# Patient Record
Sex: Female | Born: 1950 | Race: Black or African American | Hispanic: No | Smoking: Former smoker
Health system: Southern US, Community
[De-identification: ages and names within clinical notes are randomized; demographics above are authoritative.]

## PROBLEM LIST (undated history)

## (undated) DIAGNOSIS — I2510 Atherosclerotic heart disease of native coronary artery without angina pectoris: Secondary | ICD-10-CM

## (undated) DIAGNOSIS — E785 Hyperlipidemia, unspecified: Secondary | ICD-10-CM

## (undated) DIAGNOSIS — E119 Type 2 diabetes mellitus without complications: Secondary | ICD-10-CM

## (undated) DIAGNOSIS — D649 Anemia, unspecified: Secondary | ICD-10-CM

## (undated) DIAGNOSIS — K219 Gastro-esophageal reflux disease without esophagitis: Secondary | ICD-10-CM

## (undated) DIAGNOSIS — I10 Essential (primary) hypertension: Secondary | ICD-10-CM

## (undated) DIAGNOSIS — R7303 Prediabetes: Secondary | ICD-10-CM

## (undated) DIAGNOSIS — I219 Acute myocardial infarction, unspecified: Secondary | ICD-10-CM

## (undated) DIAGNOSIS — M1990 Unspecified osteoarthritis, unspecified site: Secondary | ICD-10-CM

## (undated) HISTORY — DX: Atherosclerotic heart disease of native coronary artery without angina pectoris: I25.10

## (undated) HISTORY — PX: ABDOMINAL HYSTERECTOMY: SHX81

## (undated) HISTORY — DX: Hyperlipidemia, unspecified: E78.5

---

## 2012-07-18 HISTORY — PX: BREAST BIOPSY: SHX20

## 2012-07-26 ENCOUNTER — Other Ambulatory Visit: Payer: Self-pay | Admitting: *Deleted

## 2012-07-26 DIAGNOSIS — R921 Mammographic calcification found on diagnostic imaging of breast: Secondary | ICD-10-CM

## 2012-08-02 ENCOUNTER — Ambulatory Visit
Admission: RE | Admit: 2012-08-02 | Discharge: 2012-08-02 | Disposition: A | Discharge status: Home / Self Care | Payer: No Typology Code available for payment source | Source: Ambulatory Visit | Attending: *Deleted | Admitting: *Deleted

## 2012-08-02 DIAGNOSIS — R921 Mammographic calcification found on diagnostic imaging of breast: Secondary | ICD-10-CM

## 2013-06-25 ENCOUNTER — Other Ambulatory Visit (HOSPITAL_COMMUNITY): Payer: Self-pay | Admitting: Nurse Practitioner

## 2013-06-25 ENCOUNTER — Other Ambulatory Visit (HOSPITAL_COMMUNITY): Payer: Self-pay | Admitting: Family Medicine

## 2013-06-25 DIAGNOSIS — Z139 Encounter for screening, unspecified: Secondary | ICD-10-CM

## 2013-07-19 ENCOUNTER — Ambulatory Visit (HOSPITAL_COMMUNITY)
Admission: RE | Admit: 2013-07-19 | Discharge: 2013-07-19 | Disposition: A | Discharge status: Home / Self Care | Payer: BC Managed Care – PPO | Source: Ambulatory Visit | Attending: Family Medicine | Admitting: Family Medicine

## 2013-07-19 DIAGNOSIS — Z139 Encounter for screening, unspecified: Secondary | ICD-10-CM

## 2013-07-19 DIAGNOSIS — Z1231 Encounter for screening mammogram for malignant neoplasm of breast: Secondary | ICD-10-CM | POA: Insufficient documentation

## 2014-07-24 ENCOUNTER — Other Ambulatory Visit (HOSPITAL_COMMUNITY): Payer: Self-pay | Admitting: Family Medicine

## 2014-07-24 DIAGNOSIS — Z139 Encounter for screening, unspecified: Secondary | ICD-10-CM

## 2014-08-01 ENCOUNTER — Ambulatory Visit (HOSPITAL_COMMUNITY)
Admission: RE | Admit: 2014-08-01 | Discharge: 2014-08-01 | Disposition: A | Discharge status: Home / Self Care | Payer: BC Managed Care – PPO | Source: Ambulatory Visit | Attending: Family Medicine | Admitting: Family Medicine

## 2014-08-01 DIAGNOSIS — Z1231 Encounter for screening mammogram for malignant neoplasm of breast: Secondary | ICD-10-CM | POA: Diagnosis not present

## 2014-08-01 DIAGNOSIS — Z139 Encounter for screening, unspecified: Secondary | ICD-10-CM

## 2014-10-18 HISTORY — PX: OTHER SURGICAL HISTORY: SHX169

## 2015-04-26 ENCOUNTER — Encounter (HOSPITAL_COMMUNITY)
Admission: EM | Disposition: A | Discharge status: Home / Self Care | Payer: BC Managed Care – PPO | Source: Home / Self Care | Attending: Cardiovascular Disease

## 2015-04-26 ENCOUNTER — Inpatient Hospital Stay: Admit: 2015-04-26 | Payer: Self-pay | Admitting: Cardiovascular Disease

## 2015-04-26 ENCOUNTER — Encounter (HOSPITAL_COMMUNITY)
Admission: EM | Disposition: A | Discharge status: Home / Self Care | Payer: Self-pay | Source: Home / Self Care | Attending: Cardiovascular Disease

## 2015-04-26 ENCOUNTER — Encounter (HOSPITAL_COMMUNITY): Payer: Self-pay | Admitting: *Deleted

## 2015-04-26 ENCOUNTER — Inpatient Hospital Stay (HOSPITAL_COMMUNITY): Payer: BC Managed Care – PPO

## 2015-04-26 ENCOUNTER — Inpatient Hospital Stay (HOSPITAL_COMMUNITY)
Admission: EM | Admit: 2015-04-26 | Discharge: 2015-04-28 | DRG: 247 | Disposition: A | Discharge status: Home / Self Care | Payer: BC Managed Care – PPO | Attending: Cardiovascular Disease | Admitting: Cardiovascular Disease

## 2015-04-26 DIAGNOSIS — I2111 ST elevation (STEMI) myocardial infarction involving right coronary artery: Secondary | ICD-10-CM | POA: Diagnosis not present

## 2015-04-26 DIAGNOSIS — F17210 Nicotine dependence, cigarettes, uncomplicated: Secondary | ICD-10-CM | POA: Diagnosis present

## 2015-04-26 DIAGNOSIS — I2119 ST elevation (STEMI) myocardial infarction involving other coronary artery of inferior wall: Secondary | ICD-10-CM | POA: Diagnosis present

## 2015-04-26 DIAGNOSIS — I10 Essential (primary) hypertension: Secondary | ICD-10-CM | POA: Diagnosis present

## 2015-04-26 DIAGNOSIS — Z720 Tobacco use: Secondary | ICD-10-CM | POA: Diagnosis present

## 2015-04-26 DIAGNOSIS — Z955 Presence of coronary angioplasty implant and graft: Secondary | ICD-10-CM

## 2015-04-26 DIAGNOSIS — E785 Hyperlipidemia, unspecified: Secondary | ICD-10-CM | POA: Diagnosis present

## 2015-04-26 DIAGNOSIS — E119 Type 2 diabetes mellitus without complications: Secondary | ICD-10-CM | POA: Diagnosis present

## 2015-04-26 DIAGNOSIS — I221 Subsequent ST elevation (STEMI) myocardial infarction of inferior wall: Secondary | ICD-10-CM

## 2015-04-26 DIAGNOSIS — I2510 Atherosclerotic heart disease of native coronary artery without angina pectoris: Secondary | ICD-10-CM | POA: Diagnosis present

## 2015-04-26 DIAGNOSIS — I213 ST elevation (STEMI) myocardial infarction of unspecified site: Secondary | ICD-10-CM | POA: Insufficient documentation

## 2015-04-26 HISTORY — DX: Essential (primary) hypertension: I10

## 2015-04-26 HISTORY — PX: CARDIAC CATHETERIZATION: SHX172

## 2015-04-26 HISTORY — DX: Prediabetes: R73.03

## 2015-04-26 LAB — CBC
HEMATOCRIT: 37.9 % (ref 36.0–46.0)
Hemoglobin: 13 g/dL (ref 12.0–15.0)
MCH: 31.5 pg (ref 26.0–34.0)
MCHC: 34.3 g/dL (ref 30.0–36.0)
MCV: 91.8 fL (ref 78.0–100.0)
Platelets: 251 10*3/uL (ref 150–400)
RBC: 4.13 MIL/uL (ref 3.87–5.11)
RDW: 12 % (ref 11.5–15.5)
WBC: 10.2 10*3/uL (ref 4.0–10.5)

## 2015-04-26 LAB — TROPONIN I
TROPONIN I: 3.82 ng/mL — AB (ref <0.031)
TROPONIN I: 7.93 ng/mL — AB (ref <0.031)
TROPONIN I: 5.52 ng/mL — AB (ref <0.031)

## 2015-04-26 LAB — BASIC METABOLIC PANEL
ANION GAP: 9 (ref 5–15)
BUN: <5 mg/dL — ABNORMAL LOW (ref 6–20)
CO2: 26 mmol/L (ref 22–32)
CREATININE: 0.65 mg/dL (ref 0.44–1.00)
Calcium: 9.4 mg/dL (ref 8.9–10.3)
Chloride: 103 mmol/L (ref 101–111)
Glucose, Bld: 206 mg/dL — ABNORMAL HIGH (ref 65–99)
Potassium: 3.7 mmol/L (ref 3.5–5.1)
Sodium: 138 mmol/L (ref 135–145)

## 2015-04-26 LAB — GLUCOSE, CAPILLARY
GLUCOSE-CAPILLARY: 199 mg/dL — AB (ref 65–99)
GLUCOSE-CAPILLARY: 161 mg/dL — AB (ref 65–99)
Glucose-Capillary: 168 mg/dL — ABNORMAL HIGH (ref 65–99)
Glucose-Capillary: 185 mg/dL — ABNORMAL HIGH (ref 65–99)

## 2015-04-26 LAB — COMPREHENSIVE METABOLIC PANEL
ALBUMIN: 4.2 g/dL (ref 3.5–5.0)
ALK PHOS: 111 U/L (ref 38–126)
ALT: 20 U/L (ref 14–54)
ANION GAP: 10 (ref 5–15)
AST: 41 U/L (ref 15–41)
BILIRUBIN TOTAL: 0.4 mg/dL (ref 0.3–1.2)
BUN: 5 mg/dL — AB (ref 6–20)
CHLORIDE: 103 mmol/L (ref 101–111)
CO2: 26 mmol/L (ref 22–32)
Calcium: 9.5 mg/dL (ref 8.9–10.3)
Creatinine, Ser: 0.76 mg/dL (ref 0.44–1.00)
GFR calc Af Amer: >60 mL/min (ref >60)
Glucose, Bld: 237 mg/dL — ABNORMAL HIGH (ref 65–99)
POTASSIUM: 3.9 mmol/L (ref 3.5–5.1)
Sodium: 139 mmol/L (ref 135–145)
Total Protein: 7.7 g/dL (ref 6.5–8.1)

## 2015-04-26 LAB — LIPID PANEL
CHOL/HDL RATIO: 3.4 ratio
Cholesterol: 180 mg/dL (ref 0–200)
HDL: 53 mg/dL (ref >40)
LDL Cholesterol: 112 mg/dL — ABNORMAL HIGH (ref 0–99)
TRIGLYCERIDES: 76 mg/dL (ref <150)
VLDL: 15 mg/dL (ref 0–40)

## 2015-04-26 LAB — MAGNESIUM: Magnesium: 1.6 mg/dL — ABNORMAL LOW (ref 1.7–2.4)

## 2015-04-26 LAB — TSH: TSH: 0.618 u[IU]/mL (ref 0.350–4.500)

## 2015-04-26 LAB — MRSA PCR SCREENING: MRSA BY PCR: NEGATIVE

## 2015-04-26 SURGERY — LEFT HEART CATH AND CORONARY ANGIOGRAPHY
Anesthesia: LOCAL

## 2015-04-26 MED ORDER — RADIAL COCKTAIL (HEPARIN/VERAPAMIL/LIDOCAINE/NITRO)
Status: DC | PRN
  Administered 2015-04-26: 1 via INTRA_ARTERIAL

## 2015-04-26 MED ORDER — ASPIRIN 300 MG RE SUPP: 300.0000 mg | RECTAL | Status: DC

## 2015-04-26 MED ORDER — ATORVASTATIN CALCIUM 80 MG PO TABS
80.0000 mg | ORAL_TABLET | Freq: Every day | ORAL | Status: DC
  Administered 2015-04-26 – 2015-04-27 (×2): 80 mg via ORAL
  Filled 2015-04-26 (×3): qty 1

## 2015-04-26 MED ORDER — HEPARIN SODIUM (PORCINE) 1000 UNIT/ML IJ SOLN
INTRAMUSCULAR | Status: DC | PRN
  Administered 2015-04-26: 2500 [IU] via INTRAVENOUS

## 2015-04-26 MED ORDER — IOHEXOL 300 MG/ML  SOLN
INTRAMUSCULAR | Status: DC | PRN
  Administered 2015-04-26: 175 mL via INTRA_ARTERIAL

## 2015-04-26 MED ORDER — TICAGRELOR 90 MG PO TABS
ORAL_TABLET | ORAL | Status: DC | PRN
  Administered 2015-04-26: 180 mg via ORAL

## 2015-04-26 MED ORDER — BIVALIRUDIN BOLUS VIA INFUSION - CUPID
INTRAVENOUS | Status: DC | PRN
  Administered 2015-04-26: 43.5 mg via INTRAVENOUS

## 2015-04-26 MED ORDER — ONDANSETRON HCL 4 MG/2ML IJ SOLN: 4.0000 mg | Freq: Four times a day (QID) | INTRAMUSCULAR | Status: DC | PRN

## 2015-04-26 MED ORDER — ASPIRIN EC 81 MG PO TBEC: 81.0000 mg | DELAYED_RELEASE_TABLET | Freq: Every day | ORAL | Status: DC

## 2015-04-26 MED ORDER — SODIUM CHLORIDE 0.9 % IV SOLN
1.0000 mg/kg/h | INTRAVENOUS | Status: AC
  Administered 2015-04-26: 1 mg/kg/h via INTRAVENOUS
  Filled 2015-04-26: qty 250

## 2015-04-26 MED ORDER — ACETAMINOPHEN 325 MG PO TABS: 650.0000 mg | ORAL_TABLET | ORAL | Status: DC | PRN

## 2015-04-26 MED ORDER — MORPHINE SULFATE 2 MG/ML IJ SOLN: 2.0000 mg | INTRAMUSCULAR | Status: DC | PRN

## 2015-04-26 MED ORDER — ASPIRIN 81 MG PO CHEW
81.0000 mg | CHEWABLE_TABLET | Freq: Every day | ORAL | Status: DC
  Administered 2015-04-26 – 2015-04-28 (×3): 81 mg via ORAL
  Filled 2015-04-26 (×3): qty 1

## 2015-04-26 MED ORDER — METOPROLOL TARTRATE 12.5 MG HALF TABLET
12.5000 mg | ORAL_TABLET | Freq: Two times a day (BID) | ORAL | Status: DC
  Administered 2015-04-26 – 2015-04-28 (×5): 12.5 mg via ORAL
  Filled 2015-04-26 (×6): qty 1

## 2015-04-26 MED ORDER — NITROGLYCERIN 0.4 MG SL SUBL: 0.4000 mg | SUBLINGUAL_TABLET | SUBLINGUAL | Status: DC | PRN

## 2015-04-26 MED ORDER — FENTANYL CITRATE (PF) 100 MCG/2ML IJ SOLN
INTRAMUSCULAR | Status: DC | PRN
  Administered 2015-04-26: 25 ug via INTRAVENOUS

## 2015-04-26 MED ORDER — ONDANSETRON HCL 4 MG/2ML IJ SOLN
4.0000 mg | Freq: Four times a day (QID) | INTRAMUSCULAR | Status: DC | PRN
Start: 2015-04-26 — End: 2015-04-28
  Administered 2015-04-26: 4 mg via INTRAVENOUS
  Filled 2015-04-26: qty 2

## 2015-04-26 MED ORDER — SODIUM CHLORIDE 0.9 % IV SOLN
250.0000 mg | INTRAVENOUS | Status: DC | PRN
  Administered 2015-04-26: 1.75 mg/kg/h via INTRAVENOUS

## 2015-04-26 MED ORDER — LISINOPRIL 10 MG PO TABS
10.0000 mg | ORAL_TABLET | Freq: Every day | ORAL | Status: DC
  Administered 2015-04-26 – 2015-04-28 (×3): 10 mg via ORAL
  Filled 2015-04-26 (×3): qty 1

## 2015-04-26 MED ORDER — SODIUM CHLORIDE 0.9 % IJ SOLN: 3.0000 mL | INTRAMUSCULAR | Status: DC | PRN

## 2015-04-26 MED ORDER — SODIUM CHLORIDE 0.9 % IV SOLN: 250.0000 mL | INTRAVENOUS | Status: DC | PRN

## 2015-04-26 MED ORDER — ASPIRIN 81 MG PO CHEW: 324.0000 mg | CHEWABLE_TABLET | ORAL | Status: DC

## 2015-04-26 MED ORDER — SODIUM CHLORIDE 0.9 % IJ SOLN
3.0000 mL | Freq: Two times a day (BID) | INTRAMUSCULAR | Status: DC
  Administered 2015-04-26 – 2015-04-27 (×4): 3 mL via INTRAVENOUS

## 2015-04-26 MED ORDER — TICAGRELOR 90 MG PO TABS
90.0000 mg | ORAL_TABLET | Freq: Two times a day (BID) | ORAL | Status: DC
  Administered 2015-04-26 – 2015-04-28 (×5): 90 mg via ORAL
  Filled 2015-04-26 (×6): qty 1

## 2015-04-26 MED ORDER — MIDAZOLAM HCL 2 MG/2ML IJ SOLN
INTRAMUSCULAR | Status: DC | PRN
  Administered 2015-04-26: 0.5 mg via INTRAVENOUS

## 2015-04-26 MED ORDER — TICAGRELOR 90 MG PO TABS: 90.0000 mg | ORAL_TABLET | Freq: Two times a day (BID) | ORAL | Status: DC

## 2015-04-26 MED ORDER — ATORVASTATIN CALCIUM 80 MG PO TABS
80.0000 mg | ORAL_TABLET | Freq: Every day | ORAL | Status: DC
  Filled 2015-04-26: qty 1

## 2015-04-26 MED ORDER — SODIUM CHLORIDE 0.9 % WEIGHT BASED INFUSION
3.0000 mL/kg/h | INTRAVENOUS | Status: AC
  Administered 2015-04-26: 3 mL/kg/h via INTRAVENOUS

## 2015-04-26 SURGICAL SUPPLY — 23 items
BALLN EMERGE MR 2.0X12 (BALLOONS) ×2
BALLN ~~LOC~~ EUPHORA RX 2.5X15 (BALLOONS) ×2
BALLOON EMERGE MR 2.0X12 (BALLOONS) ×1 IMPLANT
BALLOON ~~LOC~~ EUPHORA RX 2.5X15 (BALLOONS) ×1 IMPLANT
CATH INFINITI 5FR ANG PIGTAIL (CATHETERS) ×2 IMPLANT
CATH INFINITI 5FR MULTPACK ANG (CATHETERS) IMPLANT
CATH OPTITORQUE TIG 4.0 5F (CATHETERS) ×2 IMPLANT
DEVICE RAD COMP TR BAND LRG (VASCULAR PRODUCTS) ×2 IMPLANT
GLIDESHEATH SLEND A-KIT 6F 22G (SHEATH) ×2 IMPLANT
GUIDE CATH RUNWAY 6FR FR4 (CATHETERS) ×2 IMPLANT
KIT ENCORE 26 ADVANTAGE (KITS) ×2 IMPLANT
KIT HEART LEFT (KITS) ×2 IMPLANT
PACK CARDIAC CATHETERIZATION (CUSTOM PROCEDURE TRAY) ×2 IMPLANT
SHEATH PINNACLE 5F 10CM (SHEATH) IMPLANT
STENT XIENCE ALPINE RX 2.25X18 (Permanent Stent) ×2 IMPLANT
STENT XIENCE ALPINE RX 2.25X23 (Permanent Stent) ×2 IMPLANT
SYR MEDRAD MARK V 150ML (SYRINGE) ×2 IMPLANT
TRANSDUCER W/STOPCOCK (MISCELLANEOUS) ×2 IMPLANT
TUBING CIL FLEX 10 FLL-RA (TUBING) ×2 IMPLANT
WIRE ASAHI PROWATER 180CM (WIRE) ×2 IMPLANT
WIRE EMERALD 3MM-J .035X150CM (WIRE) IMPLANT
WIRE HI TORQ VERSACORE-J 145CM (WIRE) ×2 IMPLANT
WIRE SAFE-T 1.5MM-J .035X260CM (WIRE) ×2 IMPLANT

## 2015-04-26 NOTE — Progress Notes (Signed)
eLink Physician-Brief Progress Note Patient Name: Andrea SplinterSherry Hairston Herrera DOB: 07-27-1951 MRN: [id number]   Date of Service  04/26/2015  HPI/Events of Note  [age] F with no major PMH presenting with inferior MI.  To cath lab where she received DES x2 to RCA.  Patient is HD stable with sats of 97%.   eICU Interventions  Plan of care per primary cardiology team Continue [facility]     Intervention Category Evaluation Type: New Patient Evaluation  DETERDING,ELIZABETH 04/26/2015, 3:37 AM

## 2015-04-26 NOTE — Progress Notes (Signed)
Echocardiogram 2D Echocardiogram has been performed.  Dorothey BasemanReel, Shebra Muldrow M 04/26/2015, 11:36 AM

## 2015-04-26 NOTE — Progress Notes (Signed)
CRITICAL VALUE ALERT  Critical value received:  Troponin 5.52  Date of notification:  04/26/15  Time of notification:  1717  Critical value read back:Yes.    Nurse who received alert:  Toula MoosABERION, Draysen Weygandt J  MD notified (1st page):  N/a expected value, patient came in as a STEMI with 2 D.E.S stents placed this AM. Troponins trending down. Pt has no complaints of C/P this afternoon.

## 2015-04-26 NOTE — Progress Notes (Signed)
CARDIAC REHAB PHASE I   Pt tired after trip to [place]. Will hold ambulation today. Ed completed with pt. Good comprehension and questions. Interested in cRPII and will [name] (although the drive might be too far for pt). Encouraged pt to walk tomorrow. Pt plans to quit smoking. Discussed importance of Brilinta and gave booklet.  [id number]  Elissa LovettReeve, Persephonie Hegwood BethesdaKristan CES, ACSM 04/26/2015 3:54 PM

## 2015-04-26 NOTE — Progress Notes (Signed)
The patient hadacute inferior ST segment elevation myocardial infarction with a high-grade 99% distal dominant RCA stenosis with TIMI 2 flow. She had successful placement of 2 overlapping drug-eluting stents in the mid to distal and proximal to mid RCA. She had minimal disease in her left system and had normal LV function. The patient is seen on morning rounds.  She is resting comfortably.  No chest pain.  Rhythm stable normal sinus rhythm.  Echocardiogram pending.

## 2015-04-26 NOTE — H&P (Signed)
     Chief Complaint:  STEMI  HPI:  This is a 64 y.o. female with a past medical history significant for non-insulin-dependent diabetes and treated hypertension. She has no prior cardiac history. She does smoke. She developed chest pain 2 days ago which was approximately waning until this evening when she had more prolonged chest pain requiring [facility] where she was noted to have inferior ST segment elevation. [facility]   PMHx:  No past medical history on file.  No past surgical history on file.  FAMHx:  No family history on file.  SOCHx:   has no tobacco, alcohol, and drug history on file.  ALLERGIES:  Allergies not on file  ROS: Pertinent items are noted in HPI.  HOME MEDS: No prescriptions prior to admission    LABS/IMAGING: No results found for this or any previous visit (from the past 48 hour(s)). No results found.  VITALS: Blood pressure 134/77, pulse 82, resp. rate 14, SpO2 100 %.  EXAM: General appearance: alert and no distress Neck: no adenopathy, no carotid bruit, no JVD, supple, symmetrical, trachea midline and thyroid not enlarged, symmetric, no tenderness/mass/nodules Lungs: clear to auscultation bilaterally Heart: regular rate and rhythm, S1, S2 normal, no murmur, click, rub or gallop Extremities: extremities normal, atraumatic, no cyanosis or edema  IMPRESSION: Inferior STEMI  PLAN: Urgent PCI intervention performed radially  Andrea BattyBerry, Jonathan 04/26/2015, 3:02 AM

## 2015-04-27 ENCOUNTER — Inpatient Hospital Stay (HOSPITAL_COMMUNITY): Payer: BC Managed Care – PPO

## 2015-04-27 LAB — GLUCOSE, CAPILLARY
GLUCOSE-CAPILLARY: 239 mg/dL — AB (ref 65–99)
Glucose-Capillary: 161 mg/dL — ABNORMAL HIGH (ref 65–99)
Glucose-Capillary: 210 mg/dL — ABNORMAL HIGH (ref 65–99)
Glucose-Capillary: 169 mg/dL — ABNORMAL HIGH (ref 65–99)

## 2015-04-27 MED ORDER — INSULIN ASPART 100 UNIT/ML ~~LOC~~ SOLN
0.0000 [IU] | Freq: Three times a day (TID) | SUBCUTANEOUS | Status: DC
  Administered 2015-04-27: 5 [IU] via SUBCUTANEOUS
  Administered 2015-04-27: 3 [IU] via SUBCUTANEOUS
  Administered 2015-04-28: 5 [IU] via SUBCUTANEOUS

## 2015-04-27 NOTE — Progress Notes (Signed)
Patient Name: Andrea SplinterSherry Hairston Finnigan Date of Encounter: 04/27/2015     Principal Problem:   Subsequent st elevation (stemi) myocardial infarction of inferior wall Active Problems:   STEMI (ST elevation myocardial infarction)   Acute ST elevation myocardial infarction (STEMI) involving other coronary artery of inferior wall    SUBJECTIVE  Patient denies any chest pain or shortness of breath today.  Her appetite has improved since yesterday.  Her blood pressure has improved.  Telemetry shows normal sinus rhythm.  Blood sugars running high off metformin  CURRENT MEDS . aspirin  81 mg Oral Daily  . atorvastatin  80 mg Oral q1800  . lisinopril  10 mg Oral Daily  . metoprolol tartrate  12.5 mg Oral BID  . sodium chloride  3 mL Intravenous Q12H  . ticagrelor  90 mg Oral BID    OBJECTIVE  Filed Vitals:   04/27/15 0500 04/27/15 0600 04/27/15 0700 04/27/15 0723  BP: 121/45 127/54 135/51   Pulse:      Temp:    98 F (36.7 C)  TempSrc:    Oral  Resp:  17 20 16   Height:      Weight: 125 lb 10.6 oz (57 kg)     SpO2:    99%    Intake/Output Summary (Last 24 hours) at 04/27/15 0805 Last data filed at 04/26/15 1700  Gross per 24 hour  Intake    726 ml  Output    600 ml  Net    126 ml   Filed Weights   04/26/15 0310 04/27/15 0500  Weight: 124 lb 5.4 oz (56.4 kg) 125 lb 10.6 oz (57 kg)    PHYSICAL EXAM  General: Pleasant, NAD. Neuro: Alert and oriented X 3. Moves all extremities spontaneously. Psych: Normal affect. HEENT:  Normal  Neck: Supple without bruits or JVD. Lungs:  Resp regular and unlabored, CTA. Heart: RRR no s3, s4, or murmurs. Abdomen: Soft, non-tender, non-distended, BS + x 4.  Extremities: No clubbing, cyanosis or edema. DP/PT/Radials 2+ and equal bilaterally.  Accessory Clinical Findings  CBC  Recent Labs  04/26/15 0846  WBC 10.2  HGB 13.0  HCT 37.9  MCV 91.8  PLT 251   Basic Metabolic Panel  Recent Labs  04/26/15 0357 04/26/15 0846    NA 139 138  K 3.9 3.7  CL 103 103  CO2 26 26  GLUCOSE 237* 206*  BUN 5* <5*  CREATININE 0.76 0.65  CALCIUM 9.5 9.4  MG 1.6*  --    Liver Function Tests  Recent Labs  04/26/15 0357  AST 41  ALT 20  ALKPHOS 111  BILITOT 0.4  PROT 7.7  ALBUMIN 4.2   No results for input(s): LIPASE, AMYLASE in the last 72 hours. Cardiac Enzymes  Recent Labs  04/26/15 0357 04/26/15 0846 04/26/15 1350  TROPONINI 3.82* 7.93* 5.52*   BNP Invalid input(s): POCBNP D-Dimer No results for input(s): DDIMER in the last 72 hours. Hemoglobin A1C No results for input(s): HGBA1C in the last 72 hours. Fasting Lipid Panel  Recent Labs  04/26/15 0357  CHOL 180  HDL 53  LDLCALC 112*  TRIG 76  CHOLHDL 3.4   Thyroid Function Tests  Recent Labs  04/26/15 0357  TSH 0.618    TELE  Normal sinus rhythm  2-D echo: Ejection fraction 55-60%.  No regional wall motion abnormalities.   Radiology/Studies  No results found.  ASSESSMENT AND PLAN 1.  Acute inferior wall STEMI secondary to high-grade 99% distal dominant right  CVA stenosis.  Troponin peak 7.93. 2.  Diabetes mellitus 3.  Tobacco abuse 4.  Hyperlipidemia 5.  Hypertension  Plan: Advance activity.  Cardiac rehabilitation.  Walk in hall today. Obtain chest x-ray PA and lateral today. Sliding scale insulin.  Plan to resume metformin tomorrow. Anticipate probably home tomorrow.  Signed, Cassell Clement MD

## 2015-04-28 ENCOUNTER — Encounter (HOSPITAL_COMMUNITY): Payer: Self-pay | Admitting: Cardiovascular Disease

## 2015-04-28 ENCOUNTER — Other Ambulatory Visit (HOSPITAL_COMMUNITY): Payer: BC Managed Care – PPO

## 2015-04-28 DIAGNOSIS — Z720 Tobacco use: Secondary | ICD-10-CM | POA: Diagnosis present

## 2015-04-28 DIAGNOSIS — E785 Hyperlipidemia, unspecified: Secondary | ICD-10-CM | POA: Diagnosis present

## 2015-04-28 DIAGNOSIS — E119 Type 2 diabetes mellitus without complications: Secondary | ICD-10-CM

## 2015-04-28 LAB — GLUCOSE, CAPILLARY: GLUCOSE-CAPILLARY: 201 mg/dL — AB (ref 65–99)

## 2015-04-28 LAB — HEMOGLOBIN A1C
Hgb A1c MFr Bld: 6.7 % — ABNORMAL HIGH (ref 4.8–5.6)
MEAN PLASMA GLUCOSE: 146 mg/dL

## 2015-04-28 LAB — POCT ACTIVATED CLOTTING TIME: Activated Clotting Time: 552 seconds

## 2015-04-28 MED ORDER — ASPIRIN EC 81 MG PO TBEC
81.0000 mg | DELAYED_RELEASE_TABLET | Freq: Every day | ORAL | Status: DC
Start: 2015-04-28 — End: 2023-08-17

## 2015-04-28 MED ORDER — METOPROLOL TARTRATE 25 MG PO TABS: 12.5000 mg | ORAL_TABLET | Freq: Two times a day (BID) | ORAL | Status: DC

## 2015-04-28 MED ORDER — LISINOPRIL 10 MG PO TABS: 10.0000 mg | ORAL_TABLET | Freq: Every day | ORAL | Status: DC

## 2015-04-28 MED ORDER — TICAGRELOR 90 MG PO TABS
90.0000 mg | ORAL_TABLET | Freq: Two times a day (BID) | ORAL | Status: DC
Start: 2015-04-28 — End: 2015-09-01

## 2015-04-28 MED ORDER — NITROGLYCERIN 0.4 MG SL SUBL: 0.4000 mg | SUBLINGUAL_TABLET | SUBLINGUAL | Status: DC | PRN

## 2015-04-28 MED ORDER — ATORVASTATIN CALCIUM 80 MG PO TABS: 80.0000 mg | ORAL_TABLET | Freq: Every day | ORAL | Status: DC

## 2015-04-28 MED FILL — Lidocaine HCl Local Preservative Free (PF) Inj 1%: INTRAMUSCULAR | Qty: 30 | Status: AC

## 2015-04-28 MED FILL — Heparin Sodium (Porcine) 2 Unit/ML in Sodium Chloride 0.9%: INTRAMUSCULAR | Qty: 1000 | Status: AC

## 2015-04-28 NOTE — Care Management Note (Signed)
Case Management Note  Patient Details  Name: Andrea Herrera MRN: [id number] Date of Birth: 1951-01-19  Subjective/Objective:       Adm w mi           Action/Plan: lives w fam, pcp dr Selinda Flavinkevin howard   Expected Discharge Date:                  Expected Discharge Plan:     In-House Referral:     Discharge planning Services     Post Acute Care Choice:    Choice offered to:     DME Arranged:    DME Agency:     HH Arranged:    HH Agency:     Status of Service:     Medicare Important Message Given:    Date Medicare IM Given:    Medicare IM give by:    Date Additional Medicare IM Given:    Additional Medicare Important Message give by:     If discussed at Long Length of Stay Meetings, dates discussed:    Additional Comments: pt given brilinta 30day free and copay assist card. Chart states bcbs ins.  Hanley Haysowell, Lyndy Russman T, RN 04/28/2015, 9:59 AM

## 2015-04-28 NOTE — Discharge Summary (Signed)
Name: Andrea SplinterSherry Hairston Herrera MRN: [id number] DOB: [birth year] 64 y.o. PCP: Selinda FlavinKevin Howard, MD ______________________________________________________________  Date of Admission: 04/26/2015  2:05 AM Date of Discharge: 04/28/2015 Attending Physician: Runell GessJonathan J Berry, MD   Discharge Diagnosis: Patient Active Problem List   Diagnosis Date Noted  . Diabetes mellitus 04/28/2015  . Dyslipidemia 04/28/2015  . Tobacco abuse 04/28/2015  . Subsequent st elevation (stemi) myocardial infarction of inferior wall 04/26/2015  . STEMI (ST elevation myocardial infarction) 04/26/2015  . Acute ST elevation myocardial infarction (STEMI) involving other coronary artery of inferior wall 04/26/2015    Discharge Medications:   Medication List    STOP taking these medications        amLODipine 10 MG tablet  Commonly known as:  NORVASC     quinapril 20 MG tablet  Commonly known as:  ACCUPRIL      TAKE these medications        aspirin EC 81 MG tablet  Take 1 tablet (81 mg total) by mouth daily.     atorvastatin 80 MG tablet  Commonly known as:  LIPITOR  Take 1 tablet (80 mg total) by mouth daily at 6 PM.     cholecalciferol 1000 UNITS tablet  Commonly known as:  VITAMIN D  Take 1,000 Units by mouth daily.     lisinopril 10 MG tablet  Commonly known as:  PRINIVIL,ZESTRIL  Take 1 tablet (10 mg total) by mouth daily.     metFORMIN 500 MG 24 hr tablet  Commonly known as:  GLUCOPHAGE-XR  Take 500 mg by mouth daily with breakfast.     metoprolol tartrate 25 MG tablet  Commonly known as:  LOPRESSOR  Take 0.5 tablets (12.5 mg total) by mouth 2 (two) times daily.     multivitamin tablet  Take 1 tablet by mouth daily.     nitroGLYCERIN 0.4 MG SL tablet  Commonly known as:  NITROSTAT  Place 1 tablet (0.4 mg total) under the tongue every 5 (five) minutes x 3 doses as needed for chest pain.     ticagrelor 90 MG Tabs tablet  Commonly known as:  BRILINTA  Take 1 tablet (90 mg total) by  mouth 2 (two) times daily.     VITAMIN B-12 CR PO  Take 1 tablet by mouth daily.     VITAMIN E PO  Take 1 tablet by mouth daily.        Disposition and follow-up:   Andrea Herrera [facility] in good condition to home.  Please address the following problems post-discharge:  1.Compliance with medications 2.Smoking cessation  3.Diabetes management, HA1c pending  Labs / imaging needed at time of follow-up: None.   Pending labs/ test needing follow-up: HA1c.   Follow-up Appointments: Follow-up Information    Follow up with Selinda FlavinHOWARD, KEVIN, MD. Schedule an appointment as soon as possible for a visit in 1 week.   Specialty:  Family Medicine   Why:  A hospital follow up appointment   Contact information:   [address] [place] [address] [phone]       Schedule an appointment as soon as possible for a visit with [facility] [place].   Specialty:  Cardiology   Why:  A hospital follow up appointment   Contact information:   [address] [place] [id number] [phone]      Discharge Instructions: Discharge Instructions    Amb Referral to  Cardiac Rehabilitation    Complete by:  As directed   Congestive Heart Failure: If diagnosis is Heart Failure, patient MUST meet each of the CMS criteria: 1. Left Ventricular Ejection Fraction </= 35% 2. NYHA class II-IV symptoms despite being on optimal heart failure therapy for at least 6 weeks. 3. Stable = have not had a recent (<6 weeks) or planned (<6 months) major cardiovascular hospitalization or procedure  Program Details: - Physician supervised classes - 1-3 classes per week over a 12-18 week period, generally for a total of 36 sessions  Physician Certification: I certify that the above Cardiac Rehabilitation treatment is medically necessary and is medically approved by me for treatment of this patient. The patient is willing and  cooperative, able to ambulate and medically stable to participate in exercise rehabilitation. The participant's progress and Individualized Treatment Plan will be reviewed by the Medical Director, Cardiac Rehab staff and as indicated by the Referring/Ordering Physician.  Diagnosis:   Myocardial Infarction PCI       Call MD for:  difficulty breathing, headache or visual disturbances    Complete by:  As directed      Call MD for:  extreme fatigue    Complete by:  As directed      Call MD for:  persistant dizziness or light-headedness    Complete by:  As directed      Diet - low sodium heart healthy    Complete by:  As directed      Discharge instructions    Complete by:  As directed   Follow up with primary care physician and cardiology.  Please continue to take all medications as prescribed.     Increase activity slowly    Complete by:  As directed            Consultations:    Procedures Performed:  Dg Chest 2 View  04/27/2015   CLINICAL DATA:  Myocardial infarction.  EXAM: CHEST  2 VIEW  COMPARISON:  None.  FINDINGS: The cardiomediastinal silhouette is unremarkable. A coronary stent is noted.  Subsegmental atelectasis/scarring in the left lower lobe noted.  There is no evidence of focal airspace disease, pulmonary edema, suspicious pulmonary nodule/mass, pleural effusion, or pneumothorax. No acute bony abnormalities are identified.  IMPRESSION: Right lower lobe subsegmental atelectasis/scarring without other significant abnormality.   Electronically Signed   By: Harmon Pier M.D.   On: 04/27/2015 14:15    2D Echo: N/A  Cardiac Cath:  04/26/2015 Mid RCA lesion, 99% stenosed. There is a 0% residual stenosis post intervention. Prox RCA to Mid RCA lesion, 75% stenosed. There is a 0% residual stenosis post intervention. The left ventricular systolic function is normal.  Admission HPI:  This is a 64 y.o. female with a past medical history significant for non-insulin-dependent diabetes  and treated hypertension. She has no prior cardiac history. She does smoke. She developed chest pain 2 days ago which was approximately waning until this evening when she had more prolonged chest pain requiring evaluation at [facility] emergency room where she was noted to have inferior ST segment elevation. She was transported urgently to [facility] for cardiac catheterization.  Original H&P by: Runell Gess, MD  Hospital Course by problem list: Principal Problem:   Acute ST elevation myocardial infarction (STEMI) involving other coronary artery of inferior wall Active Problems:   Diabetes mellitus   Dyslipidemia   Tobacco abuse   Inferior STEMI s/p stenting  Pt admitted on 7/9 from [facility] for  worsening CP found to have inferior STEMI and transferred to [facility] for cardiac cath. LHC showed Mid RCA lesion, 99% stenosed and Prox RCA to mid RCA lesion, 75% stenosed. Had 2 overlapping DES placed in the mid to distal and proximal to mid RCA. Normal LVEF. Doing well overall, had an episode of CP this AM while lying down, but has been ambulating in the unit without CP.  She was started on brilinta and ASA which she should continue.   She should continue on metoprolol, lisinopril for hypertension.  She will also continue atorvastatin 80mg  daily.  Smoking cessation strongly encouraged.    HTN Stable.  She should continue metoprolol and lisinopril for hypertension.  Amlodipine was discontinued and metoprolol started.    Dyslipidemia Lipid panel revealed TC: 180, Trig: 76, HDL: 53, LDL: 112.  She was started on atorvastatin 80mg  which she should continue.    DMII She was started on sliding scale insulin but should resume her home metformin daily.  HA1c was pending at time of discharge.   Tobacco abuse Smoking cessation encouraged.         Discharge Vitals:   BP 142/62 mmHg  Pulse 78  Temp(Src) 98.1 F (36.7 C) (Oral)  Resp 18  Ht 5' (1.524 m)  Wt 54.8 kg (120 lb 13  oz)  BMI 23.59 kg/m2  SpO2 100%  Discharge Labs:  Results for orders placed or performed during the hospital encounter of 04/26/15 (from the past 24 hour(s))  Glucose, capillary     Status: Abnormal   Collection Time: 04/27/15 11:53 AM  Result Value Ref Range   Glucose-Capillary 210 (H) 65 - 99 mg/dL  Glucose, capillary     Status: Abnormal   Collection Time: 04/27/15  4:40 PM  Result Value Ref Range   Glucose-Capillary 169 (H) 65 - 99 mg/dL   Comment 1 Capillary Specimen   Glucose, capillary     Status: Abnormal   Collection Time: 04/27/15  9:45 PM  Result Value Ref Range   Glucose-Capillary 239 (H) 65 - 99 mg/dL  Glucose, capillary     Status: Abnormal   Collection Time: 04/28/15  7:32 AM  Result Value Ref Range   Glucose-Capillary 201 (H) 65 - 99 mg/dL   Comment 1 Capillary Specimen     Signed: Marrian Salvage, MD  PGY-3, Internal Medicine Teaching Service  04/28/2015, 9:28 AM   Services Ordered on Discharge: None Equipment Ordered on Discharge: None  Attending Note:   The patient was seen and examined.  Agree with assessment and plan as noted above.  Changes made to the above note as needed.  See my note from earlier today  Patient is stable for Dc   Alvia Grove., MD, [facility] 04/28/2015, 5:27 PM [address]  [address] Office [phone] Pager [phone]

## 2015-04-28 NOTE — Progress Notes (Signed)
CARDIAC REHAB PHASE I   PRE:  Rate/Rhythm: 77 SR  BP:  Sitting: 160/60        SaO2: 100 RA  MODE:  Ambulation: 700 ft   POST:  Rate/Rhythm: 96 sR  BP:  Sitting: 146/62         SaO2: 98 RA  Pt sitting up in chair, just finished breakfast, states she has been walking around the unit and is ready to go home. Pt ambulated 700 ft on RA, independent, brisk, steady gait, tolerated well. Pt denies CP, dizziness, DOE, declined rest stop. Reviewed diet handouts with pt and 30 day free brilinta card. Pt verbalized understanding. Pt also remains agreeable to phase 2 cardiac rehab referral to [place]. Pt to recliner after walk, call bell within reach.   [phone]  Joylene GrapesMonge, Dierks Wach C, RN, BSN 04/28/2015 8:53 AM

## 2015-04-28 NOTE — Progress Notes (Signed)
Discharged to home with sister 

## 2015-04-28 NOTE — Progress Notes (Signed)
Subjective:    Day of hospitalization: 2  VSS.  No overnight events.  Pt had 1 episode of CP while lying down this AM that lasted about a minute.  She has been up walking around without any complaints.      Objective:   Temp:  [98 F (36.7 C)-98.7 F (37.1 C)] 98.1 F (36.7 C) (07/11 0734) Pulse Rate:  [78] 78 (07/10 0920) Resp:  [16-20] 18 (07/10 2355) BP: (123-145)/(49-79) 134/63 mmHg (07/11 0355) SpO2:  [99 %-100 %] 100 % (07/11 0355) Weight:  [54.8 kg (120 lb 13 oz)] 54.8 kg (120 lb 13 oz) (07/11 0350) Last BM Date: 04/25/15  Filed Weights   04/26/15 0310 04/27/15 0500 04/28/15 0350  Weight: 56.4 kg (124 lb 5.4 oz) 57 kg (125 lb 10.6 oz) 54.8 kg (120 lb 13 oz)    Intake/Output Summary (Last 24 hours) at 04/28/15 0735 Last data filed at 04/27/15 1600  Gross per 24 hour  Intake    486 ml  Output      0 ml  Net    486 ml    Telemetry:   Physical Exam: General: NAD. HEENT: EOMI, Martin/AT. Lungs: CTAB, nonlabored. Cardiac: RRR, no m/r/g. Abdomen: +BS, NT/ND.   Extremities: No LE edema.  Neuro: Alert and oriented x3. Moving all extremities.   Lab Results:  Basic Metabolic Panel:  Recent Labs Lab 04/26/15 0357 04/26/15 0846  NA 139 138  K 3.9 3.7  CL 103 103  CO2 26 26  GLUCOSE 237* 206*  BUN 5* <5*  CREATININE 0.76 0.65  CALCIUM 9.5 9.4  MG 1.6*  --     Liver Function Tests:  Recent Labs Lab 04/26/15 0357  AST 41  ALT 20  ALKPHOS 111  BILITOT 0.4  PROT 7.7  ALBUMIN 4.2    CBC:  Recent Labs Lab 04/26/15 0846  WBC 10.2  HGB 13.0  HCT 37.9  MCV 91.8  PLT 251    Cardiac Enzymes:  Recent Labs Lab 04/26/15 0357 04/26/15 0846 04/26/15 1350  TROPONINI 3.82* 7.93* 5.52*    BNP: No results for input(s): PROBNP in the last 8760 hours.  Coagulation: No results for input(s): INR in the last 168 hours.  Radiology: Dg Chest 2 View  04/27/2015   CLINICAL DATA:  Myocardial infarction.  EXAM: CHEST  2 VIEW  COMPARISON:  None.   FINDINGS: The cardiomediastinal silhouette is unremarkable. A coronary stent is noted.  Subsegmental atelectasis/scarring in the left lower lobe noted.  There is no evidence of focal airspace disease, pulmonary edema, suspicious pulmonary nodule/mass, pleural effusion, or pneumothorax. No acute bony abnormalities are identified.  IMPRESSION: Right lower lobe subsegmental atelectasis/scarring without other significant abnormality.   Electronically Signed   By: Harmon PierJeffrey  Hu M.D.   On: 04/27/2015 14:15     ECG:   Medications:   Scheduled Medications: . aspirin  81 mg Oral Daily  . atorvastatin  80 mg Oral q1800  . insulin aspart  0-15 Units Subcutaneous TID WC  . lisinopril  10 mg Oral Daily  . metoprolol tartrate  12.5 mg Oral BID  . sodium chloride  3 mL Intravenous Q12H  . ticagrelor  90 mg Oral BID    Infusions:    PRN Medications: sodium chloride, acetaminophen, morphine injection, nitroGLYCERIN, ondansetron (ZOFRAN) IV, sodium chloride   Assessment and Plan:   Inferior STEMI s/p stenting  [facility] found to have inferior STEMI and  transferred to [facility] for cardiac cath.  LHC showed Mid RCA lesion, 99% stenosed and Prox RCA to mid RCA lesion, 75% stenosed.  Had 2 overlapping DES placed in the mid to distal and proximal to mid RCA.  Normal LVEF.  Doing well overall, had an episode of CP this AM while lying down, but has been ambulating in the unit without CP.   -should be stable for d/c today -cont brilinta and ASA  -encourage smoking cessation   HTN Stable. -cont current meds  Dyslipidemia -cont statin   DMII  Recent Labs  04/27/15 1153 04/27/15 1640 04/27/15 2145  GLUCAP 210* 169* 239*  -cont SSI  Tobacco abuse -smoking cessation encouraged    Marrian Salvage, MD PGY-3, Internal Medicine Teaching Service 04/28/2015, 7:35 AM   Attending Note:   The patient was seen and examined.  Agree with assessment and plan as noted  above.  Changes made to the above note as needed.  Pt presented on 7/9 with Inf. STEMI. Had PCI to the RCA Has done well.  Ambulated this am without any CP.  700 feet with cardiac rehab.   Will DC to home today. Will have her return to work in 1 week.  Discussed smoking cessation.  Vesta Mixer, Montez Hageman., MD, [facility] 04/28/2015, 8:52 AM [address]  [address] Office [phone] Pager [phone]

## 2015-04-28 NOTE — Progress Notes (Signed)
Inpatient Diabetes Program Recommendations  AACE/ADA: New Consensus Statement on Inpatient Glycemic Control (2013)  Target Ranges:  Prepandial:   less than 140 mg/dL      Peak postprandial:   less than 180 mg/dL (1-2 hours)      Critically ill patients:  140 - 180 mg/dL   Results for Andrea Herrera, Andrea Herrera (MRN [id number]) as of 04/28/2015 08:54  Ref. Range 04/27/2015 07:47 04/27/2015 11:53 04/27/2015 16:40 04/27/2015 21:45 04/28/2015 07:32  Glucose-Capillary Latest Ref Range: 65-99 mg/dL 409161 (H) 811210 (H) 914169 (H) 239 (H) 201 (H)   Reason for Admission: STEMI  Diabetes history: DM 2 Outpatient Diabetes medications: Metformin 500 mg Daily Current orders for Inpatient glycemic control: Novolog moderate scale TID  Inpatient Diabetes Program Recommendations Correction (SSI): Glucose elevated overnight 239 mg/dl. If patient remains in the hospital please consider adding Novolog 0-5 units QHS for bedtime coverage.   Thanks,  Christena DeemShannon Jerrie Schussler RN, MSN, [facility] Inpatient Diabetes Coordinator Team Pager [phone]

## 2015-04-29 ENCOUNTER — Telehealth: Payer: Self-pay | Admitting: Cardiovascular Disease

## 2015-04-29 NOTE — Telephone Encounter (Signed)
Needs a D/ C phone call .Marland Kitchen. Appt on 05/07/15 w/ Dr. Dina RichJonathan Branch   Thanks

## 2015-04-29 NOTE — Telephone Encounter (Signed)
Pt. Called [facility]MT[facility] 

## 2015-04-30 NOTE — Telephone Encounter (Signed)
Patient contacted regarding discharge from [facility] on 04/28/2015.  Patient understands to follow up with provider Dina RichJonathan Branch, MD on 05/07/2015 at 11:40am [facility] Patient understands discharge instructions? yes Patient understands medications and regiment? yes Patient understands to bring all medications to this visit? yes

## 2015-05-07 ENCOUNTER — Encounter: Payer: Self-pay | Admitting: Cardiology

## 2015-05-07 ENCOUNTER — Ambulatory Visit (INDEPENDENT_AMBULATORY_CARE_PROVIDER_SITE_OTHER): Payer: BC Managed Care – PPO | Admitting: Cardiology

## 2015-05-07 VITALS — BP 152/76 | HR 63 | Ht 60.0 in | Wt 128.8 lb

## 2015-05-07 DIAGNOSIS — R002 Palpitations: Secondary | ICD-10-CM | POA: Diagnosis not present

## 2015-05-07 DIAGNOSIS — I10 Essential (primary) hypertension: Secondary | ICD-10-CM | POA: Diagnosis not present

## 2015-05-07 DIAGNOSIS — I2510 Atherosclerotic heart disease of native coronary artery without angina pectoris: Secondary | ICD-10-CM | POA: Diagnosis not present

## 2015-05-07 DIAGNOSIS — E785 Hyperlipidemia, unspecified: Secondary | ICD-10-CM | POA: Diagnosis not present

## 2015-05-07 MED ORDER — METOPROLOL TARTRATE 25 MG PO TABS: 25.0000 mg | ORAL_TABLET | Freq: Two times a day (BID) | ORAL | Status: DC

## 2015-05-07 NOTE — Patient Instructions (Signed)
Your physician recommends that you schedule a follow-up appointment in: 3 MONTHS WITH DR. BRANCH  Your physician has recommended you make the following change in your medication:   INCREASE LOPRESSOR 25 MG TWICE DAILY  Thank you for choosing [facility]!!

## 2015-05-07 NOTE — Progress Notes (Signed)
Patient ID: Andrea Herrera, female   DOB: Aug 16, 1951, 64 y.o.   MRN: [id number]     Clinical Summary Andrea Herrera is a 64 y.o.female seen today for hospital follow up appointment, this is our first visit together. She is seen for the following medical problems.  1. CAD - admit 40981 with inferior STEMI, s/p DES x 2 to RCA. LVEF 55-60% by echo - notes some mild fatigue at home, but overall feeling well. - denies any chest pain - compliant with meds - referred to cardiac rehab, orientation in a few weeks  2. HTN - compliant with meds  3. Hyperlipidemia - TC 180 TG 76 HDL 53 LDL 112  4. Palpitations - typically occurs with activity. Occurs 2 times a week - heavy caffeine intake   Past Medical History  Diagnosis Date  . Hypertension   . Pre-diabetes      No Known Allergies   Current Outpatient Prescriptions  Medication Sig Dispense Refill  . aspirin EC 81 MG tablet Take 1 tablet (81 mg total) by mouth daily. 30 tablet 3  . atorvastatin (LIPITOR) 80 MG tablet Take 1 tablet (80 mg total) by mouth daily at 6 PM. 30 tablet 3  . cholecalciferol (VITAMIN D) 1000 UNITS tablet Take 1,000 Units by mouth daily.    . Cyanocobalamin (VITAMIN B-12 CR PO) Take 1 tablet by mouth daily.    Marland Kitchen lisinopril (PRINIVIL,ZESTRIL) 10 MG tablet Take 1 tablet (10 mg total) by mouth daily. 30 tablet 3  . metFORMIN (GLUCOPHAGE-XR) 500 MG 24 hr tablet Take 500 mg by mouth daily with breakfast.     . metoprolol tartrate (LOPRESSOR) 25 MG tablet Take 0.5 tablets (12.5 mg total) by mouth 2 (two) times daily. 30 tablet 3  . Multiple Vitamin (MULTIVITAMIN) tablet Take 1 tablet by mouth daily.    . nitroGLYCERIN (NITROSTAT) 0.4 MG SL tablet Place 1 tablet (0.4 mg total) under the tongue every 5 (five) minutes x 3 doses as needed for chest pain. 30 tablet 0  . ticagrelor (BRILINTA) 90 MG TABS tablet Take 1 tablet (90 mg total) by mouth 2 (two) times daily. 60 tablet 3  . VITAMIN E PO Take 1 tablet by mouth daily.      No current facility-administered medications for this visit.     Past Surgical History  Procedure Laterality Date  . Cardiac catheterization N/A 04/26/2015    Procedure: Left Heart Cath and Coronary Angiography;  Surgeon: Runell Gess, MD;  Location: [facility] INVASIVE CV LAB;  Service: Cardiovascular;  Laterality: N/A;     No Known Allergies    No family history on file.   Social History Andrea Herrera reports that she has been smoking Cigarettes.  She has a 2.5 pack-year smoking history. She does not have any smokeless tobacco history on file. Andrea Herrera reports that she does not drink alcohol.   Review of Systems CONSTITUTIONAL: No weight loss, fever, chills, weakness or fatigue.  HEENT: Eyes: No visual loss, blurred vision, double vision or yellow sclerae.No hearing loss, sneezing, congestion, runny nose or sore throat.  SKIN: No rash or itching.  CARDIOVASCULAR: per HPI RESPIRATORY: No shortness of breath, cough or sputum.  GASTROINTESTINAL: No anorexia, nausea, vomiting or diarrhea. No abdominal pain or blood.  GENITOURINARY: No burning on urination, no polyuria NEUROLOGICAL: No headache, dizziness, syncope, paralysis, ataxia, numbness or tingling in the extremities. No change in bowel or bladder control.  MUSCULOSKELETAL: No muscle, back pain, joint pain or stiffness.  LYMPHATICS:  No enlarged nodes. No history of splenectomy.  PSYCHIATRIC: No history of depression or anxiety.  ENDOCRINOLOGIC: No reports of sweating, cold or heat intolerance. No polyuria or polydipsia.  Marland Kitchen.   Physical Examination Filed Vitals:   05/07/15 1142  BP: 152/76  Pulse: 63   Filed Vitals:   05/07/15 1142  Height: 5' (1.524 m)  Weight: 128 lb 12.8 oz (58.423 kg)    Gen: resting comfortably, no acute distress HEENT: no scleral icterus, pupils equal round and reactive, no palptable cervical adenopathy,  CV: RRR, no m/r/g, no jvd Resp: Clear to auscultation bilaterally GI: abdomen is soft,  non-tender, non-distended, normal bowel sounds, no hepatosplenomegaly MSK: extremities are warm, no edema.  Skin: warm, no rash Neuro:  no focal deficits Psych: appropriate affect   Diagnostic Studies  04/2015 Cath  Mid RCA lesion, 99% stenosed. There is a 0% residual stenosis post intervention.  Prox RCA to Mid RCA lesion, 75% stenosed. There is a 0% residual stenosis post intervention.  The left ventricular systolic function is normal.    04/2015 Echo - Left ventricle: The cavity size was normal. Systolic function was normal. The estimated ejection fraction was in the range of 55% to 60%. Wall motion was normal; there were no regional wall motion abnormalities. Doppler parameters are consistent with abnormal left ventricular relaxation (grade 1 diastolic dysfunction).    Assessment and Plan   1. CAD - no current symptoms, continue current meds  2. HTN - above goal, start metoprolol 25mg  bid  3. Hyperlipidemia - continue high dose statin in setting of known CAD  4. Palpitations - wean caffeine intake, start lopressor 25mg  bid.    F/u 3 months   Antoine PocheJonathan F. Kawthar Ennen, M.D.

## 2015-05-22 ENCOUNTER — Encounter (HOSPITAL_COMMUNITY)
Admission: RE | Admit: 2015-05-22 | Discharge: 2015-05-22 | Disposition: A | Discharge status: Home / Self Care | Payer: BC Managed Care – PPO | Source: Ambulatory Visit | Attending: Cardiology | Admitting: Cardiology

## 2015-05-22 VITALS — BP 200/70 | HR 66 | Ht 60.0 in | Wt 133.2 lb

## 2015-05-22 DIAGNOSIS — I2510 Atherosclerotic heart disease of native coronary artery without angina pectoris: Secondary | ICD-10-CM | POA: Insufficient documentation

## 2015-05-22 DIAGNOSIS — Z9889 Other specified postprocedural states: Secondary | ICD-10-CM | POA: Diagnosis not present

## 2015-05-22 DIAGNOSIS — I2119 ST elevation (STEMI) myocardial infarction involving other coronary artery of inferior wall: Secondary | ICD-10-CM

## 2015-05-22 DIAGNOSIS — Z955 Presence of coronary angioplasty implant and graft: Secondary | ICD-10-CM

## 2015-05-22 DIAGNOSIS — I252 Old myocardial infarction: Secondary | ICD-10-CM | POA: Insufficient documentation

## 2015-05-22 NOTE — Progress Notes (Signed)
Patient arrived for 1st visit/orientation/education at 2:30pm. Patient was referred to CR by Dr. Wyline Mood due toSTEMI I21.19 and Stent placement Z95.5. During orientation advised patient on arrival and appointment times what to wear, what to do before, during and after exercise. Reviewed attendance and class policy. Talked about inclement weather and class consultation policy. Pt is scheduled to return Cardiac Rehab on 05/26/15 at 3:45pm. Pt was advised to come to class 5 minutes before class starts. He was also given instructions on meeting with the dietician and attending the Family Structure classes. Pt is eager to get started. Patient was able to complete 6 minute walk test. Patient BP was elevated. She said she was nervous and did not know what to expect and that she has been having car trouble. She feels those things contributed to her BP being high. Advised patient that if she started to have symptoms due to elevation to please contact her doctor. Will send cardiologist a note informing of her elevated BP. Patient was measured for the equipment. Discussed equipment safety with patient. Took patient pre-anthropometric measurements. Patient finished visit at 5:10pm.

## 2015-05-22 NOTE — Progress Notes (Signed)
Cardiac/Pulmonary Rehab Medication Review by a Pharmacist  Does the patient  feel that his/her medications are working for him/her?  yes  Has the patient been experiencing any side effects to the medications prescribed?  no  Does the patient measure his/her own blood pressure or blood glucose at home?  yes   Does the patient have any problems obtaining medications due to transportation or finances?   no  Understanding of regimen: excellent Understanding of indications: excellent Potential of compliance: excellent  Questions asked to Determine Patient Understanding of Medication Regimen:  1. What is the name of the medication?  2. What is the medication used for?  3. When should it be taken?  4. How much should be taken?  5. How will you take it?  6. What side effects should you report?  Understanding Defined as: Excellent: All questions above are correct Good: Questions 1-4 are correct Fair: Questions 1-2 are correct  Poor: 1 or none of the above questions are correct   Pharmacist comments: Pt does not c/o any side effects from medication.  Does report some pain in joints and thumb on right hand that has been going on for a while.  Also c/o heart racing at times, usually during the day but not every day.  Talked about trying some OTC tylenol ER tablets to help with the joint pain.  Valrie Hart A 05/22/2015 3:09 PM

## 2015-05-23 ENCOUNTER — Telehealth: Payer: Self-pay | Admitting: *Deleted

## 2015-05-23 NOTE — Telephone Encounter (Signed)
-----   Message from Jonathan F Branch, MD sent at 05/23/2015  2:45 PM EDT ----- Regarding: FW: Patient BP elevated Cathy, please contact patient and see if she has been taking her bp meds and if she took prior to her rehab visit. Does she have a way to check her home bp? If she took her meds that day and does not have a way to check her home bp then please add her on to PA schedule next week. If she has a home bp cuff she can instead keep a log over the weekend and update us with numbers on Monday  JB ----- Message -----    From: Diane B Coad    Sent: 05/22/2015   6:08 PM      To: Jonathan F Branch, MD Subject: Patient BP elevated                            Dr. Branch, Your patient came in today for orientation to get started in Cardiac Rehab. She is scheduled to start on 05/26/15 at 3:45pm. Her BP is extremely elevated. It was 200/70 at rest, 210/80 after 6 minute walk test and 180/78 after post 4 min rest. I took it again right before she left (after being here sitting for another 45 minutes to an hour) and it was still elevated at 198/82. She was not having chest pain or did not feel bad. She said she was anxious about coming to CR today, she has a lot of stress in family life and that her car was acting up. These may have contributed to the elevation, not sure. I just wanted you to know just in case you wanted to discuss her meds. I told her that I was going to share this with you. Please advise if there is something that you would like us to do. Thanks so much Diane Coad, Manager  

## 2015-05-23 NOTE — Telephone Encounter (Signed)
Called patient. No answer. Left message to call back.  

## 2015-05-26 ENCOUNTER — Telehealth: Payer: Self-pay | Admitting: *Deleted

## 2015-05-26 ENCOUNTER — Telehealth: Payer: Self-pay

## 2015-05-26 ENCOUNTER — Encounter (HOSPITAL_COMMUNITY)
Admission: RE | Admit: 2015-05-26 | Discharge: 2015-05-26 | Disposition: A | Discharge status: Home / Self Care | Payer: BC Managed Care – PPO | Source: Ambulatory Visit | Attending: Cardiology | Admitting: Cardiology

## 2015-05-26 DIAGNOSIS — I252 Old myocardial infarction: Secondary | ICD-10-CM | POA: Diagnosis not present

## 2015-05-26 MED ORDER — LISINOPRIL 20 MG PO TABS: 20.0000 mg | ORAL_TABLET | Freq: Every day | ORAL | Status: DC

## 2015-05-26 NOTE — Addendum Note (Signed)
Addended by: Kerney Elbe on: 05/26/2015 04:46 PM   Modules accepted: Orders

## 2015-05-26 NOTE — Telephone Encounter (Signed)
Increase her lisinopril to  daily. Have her added to PA schedule for next week for uncontrolled HTN  Dominga Ferry MD

## 2015-05-26 NOTE — Telephone Encounter (Signed)
Called patient, no answer.  Left message to call back.

## 2015-05-26 NOTE — Telephone Encounter (Signed)
-----   Message from Antoine Poche, MD sent at 05/23/2015  2:45 PM EDT ----- Regarding: FW: Patient BP elevated Cathy, please contact patient and see if she has been taking her bp meds and if she took prior to her rehab visit. Does she have a way to check her home bp? If she took her meds that day and does not have a way to check her home bp then please add her on to PA schedule next week. If she has a home bp cuff she can instead keep a log over the weekend and update [name] with numbers on Monday  JB ----- Message -----    From: Rolene Course    Sent: 05/22/2015   6:08 PM      To: Antoine Poche, MD Subject: Patient BP elevated                            Dr. Wyline Mood, Your patient came in today for orientation to get started in Cardiac Rehab. She is scheduled to start on 05/26/15 at 3:45pm. Her BP is extremely elevated. It was 200/70 at rest, 210/80 after 6 minute walk test and 180/78 after post 4 min rest. I took it again right before she left (after being here sitting for another 45 minutes to an hour) and it was still elevated at 198/82. She was not having chest pain or did not feel bad. She said she was anxious about coming to CR today, she has a lot of stress in family life and that her car was acting up. These may have contributed to the elevation, not sure. I just wanted you to know just in case you wanted to discuss her meds. I told her that I was going to share this with you. Please advise if there is something that you would like [name] to do. Thanks so much Hart Rochester, Production designer, theatre/television/film

## 2015-05-26 NOTE — Telephone Encounter (Signed)
I was only able to reach patient today after leaving messages.Pt has taken all her BP medications and is able to monitor BP at home and will record for 1 week and drop off at office.She expressed a lot of recent personal stress at home currently. She has her first cardiac rehab session later today so I guess we will see what her BP is there.

## 2015-05-26 NOTE — Telephone Encounter (Signed)
Patient came to office after having cardiac rehab. Patient c/o headache, body ache. Patient states her pain is  worse over last 2 weeks. Blood pressure today is 190/100. Please advise.

## 2015-05-27 NOTE — Telephone Encounter (Signed)
Patient notified all questions answered.

## 2015-05-28 ENCOUNTER — Encounter (HOSPITAL_COMMUNITY)
Admission: RE | Admit: 2015-05-28 | Discharge: 2015-05-28 | Disposition: A | Discharge status: Home / Self Care | Payer: BC Managed Care – PPO | Source: Ambulatory Visit | Attending: Cardiology | Admitting: Cardiology

## 2015-05-28 DIAGNOSIS — I252 Old myocardial infarction: Secondary | ICD-10-CM | POA: Diagnosis not present

## 2015-05-30 ENCOUNTER — Encounter (HOSPITAL_COMMUNITY): Payer: BC Managed Care – PPO

## 2015-06-02 ENCOUNTER — Encounter (HOSPITAL_COMMUNITY)
Admission: RE | Admit: 2015-06-02 | Discharge: 2015-06-02 | Disposition: A | Discharge status: Home / Self Care | Payer: BC Managed Care – PPO | Source: Ambulatory Visit | Attending: Cardiology | Admitting: Cardiology

## 2015-06-02 DIAGNOSIS — I252 Old myocardial infarction: Secondary | ICD-10-CM | POA: Diagnosis not present

## 2015-06-04 ENCOUNTER — Ambulatory Visit (INDEPENDENT_AMBULATORY_CARE_PROVIDER_SITE_OTHER): Payer: BC Managed Care – PPO | Admitting: Physician Assistant

## 2015-06-04 ENCOUNTER — Encounter (HOSPITAL_COMMUNITY)
Admission: RE | Admit: 2015-06-04 | Discharge: 2015-06-04 | Disposition: A | Discharge status: Home / Self Care | Payer: BC Managed Care – PPO | Source: Ambulatory Visit | Attending: Cardiology | Admitting: Cardiology

## 2015-06-04 ENCOUNTER — Encounter: Payer: Self-pay | Admitting: Physician Assistant

## 2015-06-04 VITALS — BP 162/76 | HR 73 | Ht 60.0 in | Wt 131.0 lb

## 2015-06-04 DIAGNOSIS — R002 Palpitations: Secondary | ICD-10-CM

## 2015-06-04 DIAGNOSIS — R198 Other specified symptoms and signs involving the digestive system and abdomen: Secondary | ICD-10-CM

## 2015-06-04 DIAGNOSIS — I2510 Atherosclerotic heart disease of native coronary artery without angina pectoris: Secondary | ICD-10-CM | POA: Diagnosis not present

## 2015-06-04 DIAGNOSIS — I10 Essential (primary) hypertension: Secondary | ICD-10-CM

## 2015-06-04 DIAGNOSIS — R6889 Other general symptoms and signs: Secondary | ICD-10-CM

## 2015-06-04 DIAGNOSIS — R0989 Other specified symptoms and signs involving the circulatory and respiratory systems: Secondary | ICD-10-CM

## 2015-06-04 DIAGNOSIS — I252 Old myocardial infarction: Secondary | ICD-10-CM | POA: Diagnosis not present

## 2015-06-04 DIAGNOSIS — Z720 Tobacco use: Secondary | ICD-10-CM

## 2015-06-04 MED ORDER — AMLODIPINE BESYLATE 10 MG PO TABS
10.0000 mg | ORAL_TABLET | Freq: Every day | ORAL | Status: DC
Start: 2015-06-04 — End: 2019-12-07

## 2015-06-04 MED ORDER — METOPROLOL TARTRATE 25 MG PO TABS: 12.5000 mg | ORAL_TABLET | Freq: Two times a day (BID) | ORAL | Status: DC

## 2015-06-04 NOTE — Patient Instructions (Signed)
Your physician recommends that you schedule a follow-up appointment in: 2 weeks with Jacolyn Reedy, PA-C  Your physician has recommended you make the following change in your medication:   Start Amlodipine 10 mg Daily  Decrease Metoprolol (Lopressor) to 12.5 mg Two times Daily  NO CAFFEINE !  Thank you for choosing [facility]!  Low-Sodium Eating Plan Sodium raises blood pressure and causes water to be held in the body. Getting less sodium from food will help lower your blood pressure, reduce any swelling, and protect your heart, liver, and kidneys. We get sodium by adding salt (sodium chloride) to food. Most of our sodium comes from canned, boxed, and frozen foods. Restaurant foods, fast foods, and pizza are also very high in sodium. Even if you take medicine to lower your blood pressure or to reduce fluid in your body, getting less sodium from your food is important. WHAT IS MY PLAN? Most people should limit their sodium intake to 2,300 mg a day. Your health care provider recommends that you limit your sodium intake to __________ a day.  WHAT DO I NEED TO KNOW ABOUT THIS EATING PLAN? For the low-sodium eating plan, you will follow these general guidelines:  Choose foods with a % Daily Value for sodium of less than 5% (as listed on the food label).   Use salt-free seasonings or herbs instead of table salt or sea salt.   Check with your health care provider or pharmacist before using salt substitutes.   Eat fresh foods.  Eat more vegetables and fruits.  Limit canned vegetables. If you do use them, rinse them well to decrease the sodium.   Limit cheese to 1 oz (28 g) per day.   Eat lower-sodium products, often labeled as "lower sodium" or "no salt added."  Avoid foods that contain monosodium glutamate (MSG). MSG is sometimes added to [place] food and some canned foods.  Check food labels (Nutrition Facts labels) on foods to learn how much sodium is in one  serving.  Eat more home-cooked food and less restaurant, buffet, and fast food.  When eating at a restaurant, ask that your food be prepared with less salt or none, if possible.  HOW DO I READ FOOD LABELS FOR SODIUM INFORMATION? The Nutrition Facts label lists the amount of sodium in one serving of the food. If you eat more than one serving, you must multiply the listed amount of sodium by the number of servings. Food labels may also identify foods as:  Sodium free--Less than 5 mg in a serving.  Very low sodium--35 mg or less in a serving.  Low sodium--140 mg or less in a serving.  Light in sodium--50% less sodium in a serving. For example, if a food that usually has 300 mg of sodium is changed to become light in sodium, it will have 150 mg of sodium.  Reduced sodium--25% less sodium in a serving. For example, if a food that usually has 400 mg of sodium is changed to reduced sodium, it will have 300 mg of sodium. WHAT FOODS CAN I EAT? Grains Low-sodium cereals, including oats, puffed wheat and rice, and shredded wheat cereals. Low-sodium crackers. Unsalted rice and pasta. Lower-sodium bread.  Vegetables Frozen or fresh vegetables. Low-sodium or reduced-sodium canned vegetables. Low-sodium or reduced-sodium tomato sauce and paste. Low-sodium or reduced-sodium tomato and vegetable juices.  Fruits Fresh, frozen, and canned fruit. Fruit juice.  Meat and Other Protein Products Low-sodium canned tuna and salmon. Fresh or frozen meat, poultry, seafood,  and fish. Lamb. Unsalted nuts. Dried beans, peas, and lentils without added salt. Unsalted canned beans. Homemade soups without salt. Eggs.  Dairy Milk. Soy milk. Ricotta cheese. Low-sodium or reduced-sodium cheeses. Yogurt.  Condiments Fresh and dried herbs and spices. Salt-free seasonings. Onion and garlic powders. Low-sodium varieties of mustard and ketchup. Lemon juice.  Fats and Oils Reduced-sodium salad dressings. Unsalted  butter.  Other Unsalted popcorn and pretzels.  The items listed above may not be a complete list of recommended foods or beverages. Contact your dietitian for more options. WHAT FOODS ARE NOT RECOMMENDED? Grains Instant hot cereals. Bread stuffing, pancake, and biscuit mixes. Croutons. Seasoned rice or pasta mixes. Noodle soup cups. Boxed or frozen macaroni and cheese. Self-rising flour. Regular salted crackers. Vegetables Regular canned vegetables. Regular canned tomato sauce and paste. Regular tomato and vegetable juices. Frozen vegetables in sauces. Salted french fries. Olives. Rosita Fire. Relishes. Sauerkraut. Salsa. Meat and Other Protein Products Salted, canned, smoked, spiced, or pickled meats, seafood, or fish. Bacon, ham, sausage, hot dogs, corned beef, chipped beef, and packaged luncheon meats. Salt pork. Jerky. Pickled herring. Anchovies, regular canned tuna, and sardines. Salted nuts. Dairy Processed cheese and cheese spreads. Cheese curds. Blue cheese and cottage cheese. Buttermilk.  Condiments Onion and garlic salt, seasoned salt, table salt, and sea salt. Canned and packaged gravies. Worcestershire sauce. Tartar sauce. Barbecue sauce. Teriyaki sauce. Soy sauce, including reduced sodium. Steak sauce. Fish sauce. Oyster sauce. Cocktail sauce. Horseradish. Regular ketchup and mustard. Meat flavorings and tenderizers. Bouillon cubes. Hot sauce. Tabasco sauce. Marinades. Taco seasonings. Relishes. Fats and Oils Regular salad dressings. Salted butter. Margarine. Ghee. Bacon fat.  Other Potato and tortilla chips. Corn chips and puffs. Salted popcorn and pretzels. Canned or dried soups. Pizza. Frozen entrees and pot pies.  The items listed above may not be a complete list of foods and beverages to avoid. Contact your dietitian for more information. Document Released: 03/26/2002 Document Revised: 10/09/2013 Document Reviewed: 08/08/2013 [facility] Patient Information 2015 [place],  [place]. This information is not intended to replace advice given to you by your health care provider. Make sure you discuss any questions you have with your health care provider.

## 2015-06-04 NOTE — Progress Notes (Signed)
Cardiac Rehabilitation Program Outcomes Report   Orientation:  05/22/15 Graduate Date:  tbd Discharge Date:  tbd # of sessions completed: 3  Cardiologist: Branch Family MD:  Halina Andreas Time:  1545  A.  Exercise Program:  Tolerates exercise @ 3.00 METS for 15 minutes and Walk Test Results:  Pre: walk test 2.89 mets  B.  Mental Health:  Good mental attitude  C.  Education/Instruction/Skills  Accurately checks own pulse.  Rest:  78  Exercise:  105  Uses Perceived Exertion Scale and/or Dyspnea Scale  D.  Nutrition/Weight Control/Body Composition:  Adherence to prescribed nutrition program: good    E.  Blood Lipids    Lab Results  Component Value Date   CHOL 180 04/26/2015   HDL 53 04/26/2015   LDLCALC 112* 04/26/2015   TRIG 76 04/26/2015   CHOLHDL 3.4 04/26/2015    F.  Lifestyle Changes:  Making positive lifestyle changes  G.  Symptoms noted with exercise:  Asymptomatic  Report Completed By:  Doretha Sou RN   Comments:  This is the patients first week progress note for [facility].

## 2015-06-04 NOTE — Progress Notes (Signed)
Cardiology Office Note   Date:  06/04/2015   ID:  Andrea Herrera, DOB 31-Oct-1950, MRN [id number]  PCP:  Selinda Flavin, MD  Cardiologist: Dr. Wyline Mood  Chief Complaint: High blood pressure    History of Present Illness: Andrea Herrera is a 64 y.o. female who presents for follow-up of hypertension. 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 She saw Dr. Wyline Mood on 05/07/15 and her blood pressures were elevated. He increased metoprolol 25 mg twice a day. He also asked her to wean caffeine intake because of palpitations.   She is being followed in cardiac rehabilitation and her blood pressures have been 200/90 at times. Patient complains of swelling in her throat and feeling of increasing shortness of breath since metoprolol was increased. She feels like it tighten her throat. She denies any chest tightness or dyspnea on exertion like she had when she had her MI. She has not cut back on her caffeine. She drinks coffee in the morning and drinks sweet tea all day long. She used to take amlodipine 10 mg daily with good control of her blood pressure. Her palpitations are actually improved on higher dose metoprolol that she feels like it is making her feel worse.   Past Medical History  Diagnosis Date  . Hypertension   . Pre-diabetes     Past Surgical History  Procedure Laterality Date  . Cardiac catheterization N/A 04/26/2015    Procedure: Left Heart Cath and Coronary Angiography;  Surgeon: Runell Gess, MD;  Location: [facility] INVASIVE CV LAB;  Service: Cardiovascular;  Laterality: N/A;     Current Outpatient Prescriptions  Medication Sig Dispense Refill  . aspirin EC 81 MG tablet Take 1 tablet (81 mg total) by mouth daily. 30 tablet 3  . atorvastatin (LIPITOR) 80 MG tablet Take 1 tablet (80 mg total) by mouth daily at 6 PM. 30 tablet 3  . cholecalciferol (VITAMIN D) 1000 UNITS tablet Take 1,000 Units by mouth daily.    . Cyanocobalamin (VITAMIN  B-12 CR PO) Take 500 mcg by mouth daily.     Marland Kitchen lisinopril (PRINIVIL,ZESTRIL) 20 MG tablet Take 1 tablet (20 mg total) by mouth daily. 30 tablet 6  . metFORMIN (GLUCOPHAGE-XR) 500 MG 24 hr tablet Take 500 mg by mouth 2 (two) times daily with a meal.     . metoprolol tartrate (LOPRESSOR) 25 MG tablet Take 0.5 tablets (12.5 mg total) by mouth 2 (two) times daily. 45 tablet 3  . Multiple Vitamin (MULTIVITAMIN) tablet Take 1 tablet by mouth daily.    . nitroGLYCERIN (NITROSTAT) 0.4 MG SL tablet Place 1 tablet (0.4 mg total) under the tongue every 5 (five) minutes x 3 doses as needed for chest pain. 30 tablet 0  . VITAMIN E PO Take 400 Int'l Units by mouth daily.     Marland Kitchen amLODipine (NORVASC) 10 MG tablet Take 1 tablet (10 mg total) by mouth daily. 90 tablet 3  . ticagrelor (BRILINTA) 90 MG TABS tablet Take 1 tablet (90 mg total) by mouth 2 (two) times daily. 60 tablet 3   No current facility-administered medications for this visit.    Allergies:   Review of patient's allergies indicates no known allergies.    Social History:  The patient  reports that she quit smoking about 6 weeks ago. Her smoking use included Cigarettes. She has a 2.5 pack-year smoking history. She does not have any smokeless tobacco history on file. She  reports that she does not drink alcohol or use illicit drugs.   Family History:  The patient's    family history is not on file.    ROS:  Please see the history of present illness.   Otherwise, review of systems are positive for fatigue.   All other systems are reviewed and negative.    PHYSICAL EXAM: VS:  BP 162/76 mmHg  Pulse 73  Ht 5' (1.524 m)  Wt 131 lb (59.421 kg)  BMI 25.58 kg/m2  SpO2 98% , BMI Body mass index is 25.58 kg/(m^2). GEN: Well nourished, well developed, in no acute distress Neck: no JVD, HJR, carotid bruits, or masses Cardiac:  RRR; no murmurs,gallop, rubs, thrill or heave,  Respiratory:  clear to auscultation bilaterally, normal work of breathing GI:  soft, nontender, nondistended, + BS MS: no deformity or atrophy Extremities: without cyanosis, clubbing, edema, good distal pulses bilaterally.  Skin: warm and dry, no rash Neuro:  Strength and sensation are intact    EKG:  EKG is ordered today. The ekg ordered today demonstrates normal sinus rhythm with small inferior Q waves, unchanged from prior tracing  Recent Labs: 04/26/2015: ALT 20; BUN <5*; Creatinine, Ser 0.65; Hemoglobin 13.0; Magnesium 1.6*; Platelets 251; Potassium 3.7; Sodium 138; TSH 0.618    Lipid Panel    Component Value Date/Time   CHOL 180 04/26/2015 0357   TRIG 76 04/26/2015 0357   HDL 53 04/26/2015 0357   CHOLHDL 3.4 04/26/2015 0357   VLDL 15 04/26/2015 0357   LDLCALC 112* 04/26/2015 0357      Wt Readings from Last 3 Encounters:  06/04/15 131 lb (59.421 kg)  05/22/15 133 lb 3.2 oz (60.419 kg)  05/07/15 128 lb 12.8 oz (58.423 kg)      Other studies Reviewed: Additional studies/ records that were reviewed today include and review of the records demonstrates:  04/2015 Cath  Mid RCA lesion, 99% stenosed. There is a 0% residual stenosis post intervention.  Prox RCA to Mid RCA lesion, 75% stenosed. There is a 0% residual stenosis post intervention.  The left ventricular systolic function is normal.    04/2015 Echo - Left ventricle: The cavity size was normal. Systolic function was   normal. The estimated ejection fraction was in the range of 55%   to 60%. Wall motion was normal; there were no regional wall   motion abnormalities. Doppler parameters are consistent with   abnormal left ventricular relaxation (grade 1 diastolic   dysfunction).     ASSESSMENT AND PLAN:  Essential hypertension Patient's blood pressures been running quite high. She feels worse on the increased dose of metoprolol. She says her blood pressure is well with amlodipine 10 mg daily. I will resume this. Decrease metoprolol to 12.5 mg twice a day. 2 g sodium diet. Decrease  caffeine intake. Follow-up with me in 2 weeks for blood pressure check. Continue cardiac rehabilitation.  CAD (coronary artery disease) Patient had recent MI treated with stenting of the RCA with normal LV function. She's had some throat pain that is on like her angina that brought her to the hospital. EKG is unchanged. Continue to monitor.  Palpitations Improved on higher dose metoprolol that she thinks this dose is making her feel worse. We'll decrease metoprolol and asked her to cut back on her caffeine intake.  Tobacco abuse Patient stopped smoking    Signed, Jacolyn Reedy, PA-C  06/04/2015 2:31 PM    [facility] [address] [place], [place], [place]  [address] Phone: [phone]; Fax: [phone]

## 2015-06-04 NOTE — Assessment & Plan Note (Signed)
Patient stopped smoking

## 2015-06-04 NOTE — Assessment & Plan Note (Signed)
Patient had recent MI treated with stenting of the RCA with normal LV function. She's had some throat pain that is on like her angina that brought her to the hospital. EKG is unchanged. Continue to monitor.

## 2015-06-04 NOTE — Assessment & Plan Note (Signed)
Improved on higher dose metoprolol that she thinks this dose is making her feel worse. We'll decrease metoprolol and asked her to cut back on her caffeine intake.

## 2015-06-04 NOTE — Assessment & Plan Note (Signed)
Patient's blood pressures been running quite high. She feels worse on the increased dose of metoprolol. She says her blood pressure is well with amlodipine 10 mg daily. I will resume this. Decrease metoprolol to 12.5 mg twice a day. 2 g sodium diet. Decrease caffeine intake. Follow-up with me in 2 weeks for blood pressure check. Continue cardiac rehabilitation.

## 2015-06-06 ENCOUNTER — Encounter (HOSPITAL_COMMUNITY)
Admission: RE | Admit: 2015-06-06 | Discharge: 2015-06-06 | Disposition: A | Discharge status: Home / Self Care | Payer: BC Managed Care – PPO | Source: Ambulatory Visit | Attending: Cardiology | Admitting: Cardiology

## 2015-06-06 DIAGNOSIS — I252 Old myocardial infarction: Secondary | ICD-10-CM | POA: Diagnosis not present

## 2015-06-09 ENCOUNTER — Encounter (HOSPITAL_COMMUNITY)
Admission: RE | Admit: 2015-06-09 | Discharge: 2015-06-09 | Disposition: A | Discharge status: Home / Self Care | Payer: BC Managed Care – PPO | Source: Ambulatory Visit | Attending: Cardiology | Admitting: Cardiology

## 2015-06-09 DIAGNOSIS — I252 Old myocardial infarction: Secondary | ICD-10-CM | POA: Diagnosis not present

## 2015-06-11 ENCOUNTER — Encounter (HOSPITAL_COMMUNITY)
Admission: RE | Admit: 2015-06-11 | Discharge: 2015-06-11 | Disposition: A | Discharge status: Home / Self Care | Payer: BC Managed Care – PPO | Source: Ambulatory Visit | Attending: Cardiology | Admitting: Cardiology

## 2015-06-11 DIAGNOSIS — I252 Old myocardial infarction: Secondary | ICD-10-CM | POA: Diagnosis not present

## 2015-06-13 ENCOUNTER — Encounter (HOSPITAL_COMMUNITY)
Admission: RE | Admit: 2015-06-13 | Discharge: 2015-06-13 | Disposition: A | Discharge status: Home / Self Care | Payer: BC Managed Care – PPO | Source: Ambulatory Visit | Attending: Cardiology | Admitting: Cardiology

## 2015-06-13 DIAGNOSIS — I252 Old myocardial infarction: Secondary | ICD-10-CM | POA: Diagnosis not present

## 2015-06-13 NOTE — Progress Notes (Signed)
Patient was given individual home exercise plan. Handout was reviewed and discussed. Patient verbalized an understanding. 

## 2015-06-16 ENCOUNTER — Encounter (HOSPITAL_COMMUNITY)
Admission: RE | Admit: 2015-06-16 | Discharge: 2015-06-16 | Disposition: A | Discharge status: Home / Self Care | Payer: BC Managed Care – PPO | Source: Ambulatory Visit | Attending: Cardiology | Admitting: Cardiology

## 2015-06-16 DIAGNOSIS — I252 Old myocardial infarction: Secondary | ICD-10-CM | POA: Diagnosis not present

## 2015-06-18 ENCOUNTER — Encounter (HOSPITAL_COMMUNITY)
Admission: RE | Admit: 2015-06-18 | Discharge: 2015-06-18 | Disposition: A | Discharge status: Home / Self Care | Payer: BC Managed Care – PPO | Source: Ambulatory Visit | Attending: Cardiology | Admitting: Cardiology

## 2015-06-18 ENCOUNTER — Ambulatory Visit: Payer: BC Managed Care – PPO | Admitting: Physician Assistant

## 2015-06-18 DIAGNOSIS — I252 Old myocardial infarction: Secondary | ICD-10-CM | POA: Diagnosis not present

## 2015-06-20 ENCOUNTER — Encounter (HOSPITAL_COMMUNITY)
Admission: RE | Admit: 2015-06-20 | Discharge: 2015-06-20 | Disposition: A | Discharge status: Home / Self Care | Payer: BC Managed Care – PPO | Source: Ambulatory Visit | Attending: Cardiology | Admitting: Cardiology

## 2015-06-20 DIAGNOSIS — I252 Old myocardial infarction: Secondary | ICD-10-CM | POA: Insufficient documentation

## 2015-06-20 DIAGNOSIS — Z9889 Other specified postprocedural states: Secondary | ICD-10-CM | POA: Insufficient documentation

## 2015-06-20 DIAGNOSIS — I2510 Atherosclerotic heart disease of native coronary artery without angina pectoris: Secondary | ICD-10-CM | POA: Diagnosis not present

## 2015-06-23 ENCOUNTER — Encounter (HOSPITAL_COMMUNITY): Payer: BC Managed Care – PPO

## 2015-06-25 ENCOUNTER — Encounter: Payer: Self-pay | Admitting: Physician Assistant

## 2015-06-25 ENCOUNTER — Encounter (HOSPITAL_COMMUNITY)
Admission: RE | Admit: 2015-06-25 | Discharge: 2015-06-25 | Disposition: A | Discharge status: Home / Self Care | Payer: BC Managed Care – PPO | Source: Ambulatory Visit | Attending: Cardiology | Admitting: Cardiology

## 2015-06-25 ENCOUNTER — Ambulatory Visit (INDEPENDENT_AMBULATORY_CARE_PROVIDER_SITE_OTHER): Payer: BC Managed Care – PPO | Admitting: Physician Assistant

## 2015-06-25 VITALS — BP 134/72 | HR 78 | Ht 60.0 in | Wt 130.6 lb

## 2015-06-25 DIAGNOSIS — I2510 Atherosclerotic heart disease of native coronary artery without angina pectoris: Secondary | ICD-10-CM | POA: Diagnosis not present

## 2015-06-25 DIAGNOSIS — I10 Essential (primary) hypertension: Secondary | ICD-10-CM

## 2015-06-25 DIAGNOSIS — I252 Old myocardial infarction: Secondary | ICD-10-CM | POA: Diagnosis not present

## 2015-06-25 MED ORDER — METOPROLOL SUCCINATE ER 25 MG PO TB24
25.0000 mg | ORAL_TABLET | Freq: Every day | ORAL | Status: DC
Start: 2015-06-25 — End: 2015-09-03

## 2015-06-25 NOTE — Assessment & Plan Note (Signed)
Patient is doing well after recent MI. She is to continue in cardiac rehabilitation.

## 2015-06-25 NOTE — Progress Notes (Signed)
Cardiology Office Note   Date:  06/25/2015   ID:  Andrea Herrera, DOB [birth year], MRN [id number]  PCP:  Selinda Flavin, MD  Cardiologist:  Dr. Wyline Mood  Chief Complaint: High blood pressure    History of Present Illness: Andrea Herrera is a 64 y.o. female who presents for follow-up on hypertension. I saw her 2 weeks ago for elevated blood pressures at cardiac rehabilitation. Dr. branch had increased her metoprolol to 25 mg twice a day and she was having complaints of shortness of breath and swelling in her throat that she was convinced came from the metoprolol. She used to take amlodipine 10 mg daily with good blood pressure control. I resumed amlodipine 10 mg daily and decrease her metoprolol to 12.5 mg twice a day.  The patient is taking amlodipine 5 mg twice a day and taking metoprolol tartrate 25 mg once daily. Her throat swelling and shortness of breath are gone. Her blood pressures are much better controlled. Her headaches are almost nonexistent. She is feeling much better.  Patient also had inferior STEMI treated with DES 2 to the RCA and 04/2015 LVEF 55-60% by echo. She's had no chest pain.    Past Medical History  Diagnosis Date  . Hypertension   . Pre-diabetes     Past Surgical History  Procedure Laterality Date  . Cardiac catheterization N/A 04/26/2015    Procedure: Left Heart Cath and Coronary Angiography;  Surgeon: Runell Gess, MD;  Location: [facility] INVASIVE CV LAB;  Service: Cardiovascular;  Laterality: N/A;     Current Outpatient Prescriptions  Medication Sig Dispense Refill  . amLODipine (NORVASC) 10 MG tablet Take 1 tablet (10 mg total) by mouth daily. 90 tablet 3  . aspirin EC 81 MG tablet Take 1 tablet (81 mg total) by mouth daily. 30 tablet 3  . atorvastatin (LIPITOR) 80 MG tablet Take 1 tablet (80 mg total) by mouth daily at 6 PM. 30 tablet 3  . cholecalciferol (VITAMIN D) 1000 UNITS tablet Take 1,000 Units by mouth daily.    . Cyanocobalamin (VITAMIN B-12 CR  PO) Take 500 mcg by mouth daily.     Marland Kitchen lisinopril (PRINIVIL,ZESTRIL) 20 MG tablet Take 1 tablet (20 mg total) by mouth daily. 30 tablet 6  . metFORMIN (GLUCOPHAGE-XR) 500 MG 24 hr tablet Take 500 mg by mouth 2 (two) times daily with a meal.     . Multiple Vitamin (MULTIVITAMIN) tablet Take 1 tablet by mouth daily.    . nitroGLYCERIN (NITROSTAT) 0.4 MG SL tablet Place 1 tablet (0.4 mg total) under the tongue every 5 (five) minutes x 3 doses as needed for chest pain. 30 tablet 0  . ticagrelor (BRILINTA) 90 MG TABS tablet Take 1 tablet (90 mg total) by mouth 2 (two) times daily. 60 tablet 3  . VITAMIN E PO Take 400 Int'l Units by mouth daily.     . metoprolol succinate (TOPROL XL) 25 MG 24 hr tablet Take 1 tablet (25 mg total) by mouth daily. 90 tablet 3   No current facility-administered medications for this visit.    Allergies:   Review of patient's allergies indicates no known allergies.    Social History:  The patient  reports that she quit smoking about 2 months ago. Her smoking use included Cigarettes. She has a 2.5 pack-year smoking history. She does not have any smokeless tobacco history on file. She reports that she does not drink alcohol or use illicit drugs.   Family History:  The patient's  family history is not on file.    ROS:  Please see the history of present illness.   Otherwise, review of systems are positive for none.   All other systems are reviewed and negative.    PHYSICAL EXAM: VS:  BP 134/72 mmHg  Pulse 78  Ht 5' (1.524 m)  Wt 130 lb 9.6 oz (59.24 kg)  BMI 25.51 kg/m2  SpO2 98% , BMI Body mass index is 25.51 kg/(m^2). GEN: Well nourished, well developed, in no acute distress Neck: no JVD, HJR, carotid bruits, or masses Cardiac: RRR; no murmurs,gallop, rubs, thrill or heave,  Respiratory:  clear to auscultation bilaterally, normal work of breathing GI: soft, nontender, nondistended, + BS MS: no deformity or atrophy Extremities: without cyanosis, clubbing,  edema, good distal pulses bilaterally.  Skin: warm and dry, no rash Neuro:  Strength and sensation are intact    EKG:  EKG is not ordered today.    Recent Labs: 04/26/2015: ALT 20; BUN <5*; Creatinine, Ser 0.65; Hemoglobin 13.0; Magnesium 1.6*; Platelets 251; Potassium 3.7; Sodium 138; TSH 0.618    Lipid Panel    Component Value Date/Time   CHOL 180 04/26/2015 0357   TRIG 76 04/26/2015 0357   HDL 53 04/26/2015 0357   CHOLHDL 3.4 04/26/2015 0357   VLDL 15 04/26/2015 0357   LDLCALC 112* 04/26/2015 0357      Wt Readings from Last 3 Encounters:  06/25/15 130 lb 9.6 oz (59.24 kg)  06/04/15 131 lb (59.421 kg)  05/22/15 133 lb 3.2 oz (60.419 kg)      Other studies Reviewed: Additional studies/ records that were reviewed today include and review of the records demonstrates:  04/2015 Cath  Mid RCA lesion, 99% stenosed. There is a 0% residual stenosis post intervention.  Prox RCA to Mid RCA lesion, 75% stenosed. There is a 0% residual stenosis post intervention.  The left ventricular systolic function is normal.    04/2015 Echo - Left ventricle: The cavity size was normal. Systolic function was normal. The estimated ejection fraction was in the range of 55% to 60%. Wall motion was normal; there were no regional wall motion abnormalities. Doppler parameters are consistent with abnormal left ventricular relaxation (grade 1 diastolic dysfunction). ASSESSMENT AND PLAN:  Essential hypertension Patient's blood pressure is much better controlled despite her adjusting her medications on her own. We will give her metoprolol succinate 25 mg once daily. She has follow-up with Dr. branch later this month. Continue amlodipine 5 mg twice a day  CAD (coronary artery disease) Patient is doing well after recent MI. She is to continue in cardiac rehabilitation.    Elson Clan, PA-C  06/25/2015 3:33 PM    [facility] [address] [place],  [place], [place]  [address] Phone: [phone]; Fax: [phone]

## 2015-06-25 NOTE — Assessment & Plan Note (Signed)
Patient's blood pressure is much better controlled despite her adjusting her medications on her own. We will give her metoprolol succinate 25 mg once daily. She has follow-up with Dr. branch later this month. Continue amlodipine 5 mg twice a day

## 2015-06-25 NOTE — Patient Instructions (Signed)
Your physician recommends that you schedule a follow-up appointment in: as planned with Dr branch 10/24      Take Toprol XL 25 mg daily      Thank you for choosing [facility] !

## 2015-06-27 ENCOUNTER — Encounter (HOSPITAL_COMMUNITY)
Admission: RE | Admit: 2015-06-27 | Discharge: 2015-06-27 | Disposition: A | Discharge status: Home / Self Care | Payer: BC Managed Care – PPO | Source: Ambulatory Visit | Attending: Cardiology | Admitting: Cardiology

## 2015-06-27 DIAGNOSIS — I252 Old myocardial infarction: Secondary | ICD-10-CM | POA: Diagnosis not present

## 2015-06-30 ENCOUNTER — Encounter (HOSPITAL_COMMUNITY)
Admission: RE | Admit: 2015-06-30 | Discharge: 2015-06-30 | Disposition: A | Discharge status: Home / Self Care | Payer: BC Managed Care – PPO | Source: Ambulatory Visit | Attending: Cardiology | Admitting: Cardiology

## 2015-06-30 DIAGNOSIS — I252 Old myocardial infarction: Secondary | ICD-10-CM | POA: Diagnosis not present

## 2015-07-02 ENCOUNTER — Encounter (HOSPITAL_COMMUNITY)
Admission: RE | Admit: 2015-07-02 | Discharge: 2015-07-02 | Disposition: A | Discharge status: Home / Self Care | Payer: BC Managed Care – PPO | Source: Ambulatory Visit | Attending: Cardiology | Admitting: Cardiology

## 2015-07-02 DIAGNOSIS — I252 Old myocardial infarction: Secondary | ICD-10-CM | POA: Diagnosis not present

## 2015-07-04 ENCOUNTER — Encounter (HOSPITAL_COMMUNITY)
Admission: RE | Admit: 2015-07-04 | Discharge: 2015-07-04 | Disposition: A | Discharge status: Home / Self Care | Payer: BC Managed Care – PPO | Source: Ambulatory Visit | Attending: Cardiology | Admitting: Cardiology

## 2015-07-04 DIAGNOSIS — I252 Old myocardial infarction: Secondary | ICD-10-CM | POA: Diagnosis not present

## 2015-07-07 ENCOUNTER — Encounter (HOSPITAL_COMMUNITY)
Admission: RE | Admit: 2015-07-07 | Discharge: 2015-07-07 | Disposition: A | Discharge status: Home / Self Care | Payer: BC Managed Care – PPO | Source: Ambulatory Visit | Attending: Cardiology | Admitting: Cardiology

## 2015-07-07 DIAGNOSIS — I252 Old myocardial infarction: Secondary | ICD-10-CM | POA: Diagnosis not present

## 2015-07-07 NOTE — Progress Notes (Signed)
Cardiac Rehabilitation Program Outcomes Report   Orientation:  05/22/15 Graduate Date:  tbd Discharge Date:  tbd # of sessions completed: 18  Cardiologist: Nilda Riggs MD:  Halina Andreas Time:  1545  A.  Exercise Program:  Tolerates exercise @ 3.35 METS for 15 minutes  B.  Mental Health:  Good mental attitude  C.  Education/Instruction/Skills  Accurately checks own pulse.  Rest:  81  Exercise:  111 and Knows THR for exercise  Uses Perceived Exertion Scale and/or Dyspnea Scale  D.  Nutrition/Weight Control/Body Composition:  Adherence to prescribed nutrition program: good    E.  Blood Lipids    Lab Results  Component Value Date   CHOL 180 04/26/2015   HDL 53 04/26/2015   LDLCALC 112* 04/26/2015   TRIG 76 04/26/2015   CHOLHDL 3.4 04/26/2015    F.  Lifestyle Changes:  Making positive lifestyle changes  G.  Symptoms noted with exercise:  Asymptomatic  Report Completed By:  Doretha Sou RN   Comments:  This is the patients halfway progress note for [facility].

## 2015-07-09 ENCOUNTER — Encounter (HOSPITAL_COMMUNITY)
Admission: RE | Admit: 2015-07-09 | Discharge: 2015-07-09 | Disposition: A | Discharge status: Home / Self Care | Payer: BC Managed Care – PPO | Source: Ambulatory Visit | Attending: Cardiology | Admitting: Cardiology

## 2015-07-09 DIAGNOSIS — I252 Old myocardial infarction: Secondary | ICD-10-CM | POA: Diagnosis not present

## 2015-07-11 ENCOUNTER — Encounter (HOSPITAL_COMMUNITY)
Admission: RE | Admit: 2015-07-11 | Discharge: 2015-07-11 | Disposition: A | Discharge status: Home / Self Care | Payer: BC Managed Care – PPO | Source: Ambulatory Visit | Attending: Cardiology | Admitting: Cardiology

## 2015-07-11 DIAGNOSIS — I252 Old myocardial infarction: Secondary | ICD-10-CM | POA: Diagnosis not present

## 2015-07-14 ENCOUNTER — Encounter (HOSPITAL_COMMUNITY)
Admission: RE | Admit: 2015-07-14 | Discharge: 2015-07-14 | Disposition: A | Discharge status: Home / Self Care | Payer: BC Managed Care – PPO | Source: Ambulatory Visit | Attending: Cardiology | Admitting: Cardiology

## 2015-07-14 DIAGNOSIS — I252 Old myocardial infarction: Secondary | ICD-10-CM | POA: Diagnosis not present

## 2015-07-16 ENCOUNTER — Encounter (HOSPITAL_COMMUNITY)
Admission: RE | Admit: 2015-07-16 | Discharge: 2015-07-16 | Disposition: A | Discharge status: Home / Self Care | Payer: BC Managed Care – PPO | Source: Ambulatory Visit | Attending: Cardiology | Admitting: Cardiology

## 2015-07-16 DIAGNOSIS — I252 Old myocardial infarction: Secondary | ICD-10-CM | POA: Diagnosis not present

## 2015-07-18 ENCOUNTER — Encounter (HOSPITAL_COMMUNITY)
Admission: RE | Admit: 2015-07-18 | Discharge: 2015-07-18 | Disposition: A | Discharge status: Home / Self Care | Payer: BC Managed Care – PPO | Source: Ambulatory Visit | Attending: Cardiology | Admitting: Cardiology

## 2015-07-18 DIAGNOSIS — I252 Old myocardial infarction: Secondary | ICD-10-CM | POA: Diagnosis not present

## 2015-07-21 ENCOUNTER — Encounter (HOSPITAL_COMMUNITY)
Admission: RE | Admit: 2015-07-21 | Discharge: 2015-07-21 | Disposition: A | Discharge status: Home / Self Care | Payer: BC Managed Care – PPO | Source: Ambulatory Visit | Attending: Cardiology | Admitting: Cardiology

## 2015-07-21 DIAGNOSIS — I252 Old myocardial infarction: Secondary | ICD-10-CM | POA: Diagnosis not present

## 2015-07-21 DIAGNOSIS — Z9889 Other specified postprocedural states: Secondary | ICD-10-CM | POA: Insufficient documentation

## 2015-07-21 DIAGNOSIS — I2510 Atherosclerotic heart disease of native coronary artery without angina pectoris: Secondary | ICD-10-CM | POA: Diagnosis not present

## 2015-07-23 ENCOUNTER — Encounter (HOSPITAL_COMMUNITY)
Admission: RE | Admit: 2015-07-23 | Discharge: 2015-07-23 | Disposition: A | Discharge status: Home / Self Care | Payer: BC Managed Care – PPO | Source: Ambulatory Visit | Attending: Cardiology | Admitting: Cardiology

## 2015-07-23 DIAGNOSIS — I252 Old myocardial infarction: Secondary | ICD-10-CM | POA: Diagnosis not present

## 2015-07-25 ENCOUNTER — Encounter (HOSPITAL_COMMUNITY)
Admission: RE | Admit: 2015-07-25 | Discharge: 2015-07-25 | Disposition: A | Discharge status: Home / Self Care | Payer: BC Managed Care – PPO | Source: Ambulatory Visit | Attending: Cardiology | Admitting: Cardiology

## 2015-07-25 DIAGNOSIS — I252 Old myocardial infarction: Secondary | ICD-10-CM | POA: Diagnosis not present

## 2015-07-28 ENCOUNTER — Encounter (HOSPITAL_COMMUNITY)
Admission: RE | Admit: 2015-07-28 | Discharge: 2015-07-28 | Disposition: A | Discharge status: Home / Self Care | Payer: BC Managed Care – PPO | Source: Ambulatory Visit | Attending: Cardiology | Admitting: Cardiology

## 2015-07-28 DIAGNOSIS — I252 Old myocardial infarction: Secondary | ICD-10-CM | POA: Diagnosis not present

## 2015-07-30 ENCOUNTER — Encounter (HOSPITAL_COMMUNITY)
Admission: RE | Admit: 2015-07-30 | Discharge: 2015-07-30 | Disposition: A | Discharge status: Home / Self Care | Payer: BC Managed Care – PPO | Source: Ambulatory Visit | Attending: Cardiology | Admitting: Cardiology

## 2015-07-30 DIAGNOSIS — I252 Old myocardial infarction: Secondary | ICD-10-CM | POA: Diagnosis not present

## 2015-08-01 ENCOUNTER — Encounter (HOSPITAL_COMMUNITY)
Admission: RE | Admit: 2015-08-01 | Discharge: 2015-08-01 | Disposition: A | Discharge status: Home / Self Care | Payer: BC Managed Care – PPO | Source: Ambulatory Visit | Attending: Cardiology | Admitting: Cardiology

## 2015-08-01 ENCOUNTER — Other Ambulatory Visit (HOSPITAL_COMMUNITY): Payer: Self-pay | Admitting: Family Medicine

## 2015-08-01 DIAGNOSIS — I252 Old myocardial infarction: Secondary | ICD-10-CM | POA: Diagnosis not present

## 2015-08-01 DIAGNOSIS — Z1231 Encounter for screening mammogram for malignant neoplasm of breast: Secondary | ICD-10-CM

## 2015-08-04 ENCOUNTER — Encounter (HOSPITAL_COMMUNITY)
Admission: RE | Admit: 2015-08-04 | Discharge: 2015-08-04 | Disposition: A | Discharge status: Home / Self Care | Payer: BC Managed Care – PPO | Source: Ambulatory Visit | Attending: Cardiology | Admitting: Cardiology

## 2015-08-04 DIAGNOSIS — I252 Old myocardial infarction: Secondary | ICD-10-CM | POA: Diagnosis not present

## 2015-08-06 ENCOUNTER — Encounter (HOSPITAL_COMMUNITY)
Admission: RE | Admit: 2015-08-06 | Discharge: 2015-08-06 | Disposition: A | Discharge status: Home / Self Care | Payer: BC Managed Care – PPO | Source: Ambulatory Visit | Attending: Cardiology | Admitting: Cardiology

## 2015-08-06 ENCOUNTER — Ambulatory Visit (HOSPITAL_COMMUNITY)
Admission: RE | Admit: 2015-08-06 | Discharge: 2015-08-06 | Disposition: A | Discharge status: Home / Self Care | Payer: BC Managed Care – PPO | Source: Ambulatory Visit | Attending: Family Medicine | Admitting: Family Medicine

## 2015-08-06 DIAGNOSIS — Z1231 Encounter for screening mammogram for malignant neoplasm of breast: Secondary | ICD-10-CM | POA: Insufficient documentation

## 2015-08-06 DIAGNOSIS — I252 Old myocardial infarction: Secondary | ICD-10-CM | POA: Diagnosis not present

## 2015-08-08 ENCOUNTER — Encounter (HOSPITAL_COMMUNITY)
Admission: RE | Admit: 2015-08-08 | Discharge: 2015-08-08 | Disposition: A | Discharge status: Home / Self Care | Payer: BC Managed Care – PPO | Source: Ambulatory Visit | Attending: Cardiology | Admitting: Cardiology

## 2015-08-08 DIAGNOSIS — I252 Old myocardial infarction: Secondary | ICD-10-CM | POA: Diagnosis not present

## 2015-08-11 ENCOUNTER — Encounter (HOSPITAL_COMMUNITY)
Admission: RE | Admit: 2015-08-11 | Discharge: 2015-08-11 | Disposition: A | Discharge status: Home / Self Care | Payer: BC Managed Care – PPO | Source: Ambulatory Visit | Attending: Cardiology | Admitting: Cardiology

## 2015-08-11 ENCOUNTER — Ambulatory Visit: Payer: BC Managed Care – PPO | Admitting: Cardiology

## 2015-08-11 DIAGNOSIS — I252 Old myocardial infarction: Secondary | ICD-10-CM | POA: Diagnosis not present

## 2015-08-11 NOTE — Progress Notes (Unsigned)
Patient ID: Andrea Herrera, female   DOB: 10/07/1951, 64 y.o.   MRN: [id number]     Clinical Summary Andrea Herrera is a 64 y.o.female seen today for follow up of the following medical problems.   1. CAD - admit [address]81 with inferior STEMI, s/p DES x 2 to RCA. LVEF 55-60% by echo - notes some mild fatigue at home, but overall feeling well. - denies any chest pain - compliant with meds - referred to cardiac rehab, orientation in a few weeks  2. HTN - compliant with meds - we had fairly recently increased her lopressor to  bid, however patient thought this caused SOB and throat swelling. It was decrased to 12.5mg  bid with resolution of symptoms, and norvasc was increased to  daily. Changed to Toprol XL  daily last visit for ease of administation.   3. Hyperlipidemia - TC 180 TG 76 HDL 53 LDL 112  4. Palpitations - typically occurs with activity. Occurs 2 times a week - heavy caffeine intake Past Medical History  Diagnosis Date  . Hypertension   . Pre-diabetes      No Known Allergies   Current Outpatient Prescriptions  Medication Sig Dispense Refill  . amLODipine (NORVASC) 10 MG tablet Take 1 tablet (10 mg total) by mouth daily. 90 tablet 3  . aspirin EC 81 MG tablet Take 1 tablet (81 mg total) by mouth daily. 30 tablet 3  . atorvastatin (LIPITOR) 80 MG tablet Take 1 tablet (80 mg total) by mouth daily at 6 PM. 30 tablet 3  . cholecalciferol (VITAMIN D) 1000 UNITS tablet Take 1,000 Units by mouth daily.    . Cyanocobalamin (VITAMIN B-12 CR PO) Take 500 mcg by mouth daily.     Marland Kitchen lisinopril (PRINIVIL,ZESTRIL) 20 MG tablet Take 1 tablet (20 mg total) by mouth daily. 30 tablet 6  . metFORMIN (GLUCOPHAGE-XR) 500 MG 24 hr tablet Take 500 mg by mouth 2 (two) times daily with a meal.     . metoprolol succinate (TOPROL XL) 25 MG 24 hr tablet Take 1 tablet (25 mg total) by mouth daily. 90 tablet 3  . Multiple Vitamin (MULTIVITAMIN) tablet Take 1 tablet by mouth daily.    .  nitroGLYCERIN (NITROSTAT) 0.4 MG SL tablet Place 1 tablet (0.4 mg total) under the tongue every 5 (five) minutes x 3 doses as needed for chest pain. 30 tablet 0  . ticagrelor (BRILINTA) 90 MG TABS tablet Take 1 tablet (90 mg total) by mouth 2 (two) times daily. 60 tablet 3  . VITAMIN E PO Take 400 Int'l Units by mouth daily.      No current facility-administered medications for this visit.     Past Surgical History  Procedure Laterality Date  . Cardiac catheterization N/A 04/26/2015    Procedure: Left Heart Cath and Coronary Angiography;  Surgeon: Runell Gess, MD;  Location: [facility] INVASIVE CV LAB;  Service: Cardiovascular;  Laterality: N/A;     No Known Allergies    No family history on file.   Social History Andrea Herrera reports that she quit smoking about 3 months ago. Her smoking use included Cigarettes. She has a 2.5 pack-year smoking history. She does not have any smokeless tobacco history on file. Andrea Herrera reports that she does not drink alcohol.   Review of Systems CONSTITUTIONAL: No weight loss, fever, chills, weakness or fatigue.  HEENT: Eyes: No visual loss, blurred vision, double vision or yellow sclerae.No hearing loss, sneezing, congestion, runny nose or sore throat.  SKIN: No rash or itching.  CARDIOVASCULAR:  RESPIRATORY: No shortness of breath, cough or sputum.  GASTROINTESTINAL: No anorexia, nausea, vomiting or diarrhea. No abdominal pain or blood.  GENITOURINARY: No burning on urination, no polyuria NEUROLOGICAL: No headache, dizziness, syncope, paralysis, ataxia, numbness or tingling in the extremities. No change in bowel or bladder control.  MUSCULOSKELETAL: No muscle, back pain, joint pain or stiffness.  LYMPHATICS: No enlarged nodes. No history of splenectomy.  PSYCHIATRIC: No history of depression or anxiety.  ENDOCRINOLOGIC: No reports of sweating, cold or heat intolerance. No polyuria or polydipsia.  Marland Kitchen.   Physical Examination There were no vitals  filed for this visit. There were no vitals filed for this visit.  Gen: resting comfortably, no acute distress HEENT: no scleral icterus, pupils equal round and reactive, no palptable cervical adenopathy,  CV Resp: Clear to auscultation bilaterally GI: abdomen is soft, non-tender, non-distended, normal bowel sounds, no hepatosplenomegaly MSK: extremities are warm, no edema.  Skin: warm, no rash Neuro:  no focal deficits Psych: appropriate affect   Diagnostic Studies 04/2015 Cath  Mid RCA lesion, 99% stenosed. There is a 0% residual stenosis post intervention.  Prox RCA to Mid RCA lesion, 75% stenosed. There is a 0% residual stenosis post intervention.  The left ventricular systolic function is normal.    04/2015 Echo - Left ventricle: The cavity size was normal. Systolic function was normal. The estimated ejection fraction was in the range of 55% to 60%. Wall motion was normal; there were no regional wall motion abnormalities. Doppler parameters are consistent with abnormal left ventricular relaxation (grade 1 diastolic dysfunction).    Assessment and Plan  1. CAD - no current symptoms, continue current meds  2. HTN - above goal, start metoprolol 25mg  bid  3. Hyperlipidemia - continue high dose statin in setting of known CAD  4. Palpitations - wean caffeine intake, start lopressor 25mg  bid.       Antoine PocheJonathan F. Tao Satz, M.D.

## 2015-08-13 ENCOUNTER — Encounter (HOSPITAL_COMMUNITY): Payer: BC Managed Care – PPO

## 2015-08-15 ENCOUNTER — Encounter (HOSPITAL_COMMUNITY)
Admission: RE | Admit: 2015-08-15 | Discharge: 2015-08-15 | Disposition: A | Discharge status: Home / Self Care | Payer: BC Managed Care – PPO | Source: Ambulatory Visit | Attending: Cardiology | Admitting: Cardiology

## 2015-08-15 DIAGNOSIS — I252 Old myocardial infarction: Secondary | ICD-10-CM | POA: Diagnosis not present

## 2015-08-18 ENCOUNTER — Encounter (HOSPITAL_COMMUNITY)
Admission: RE | Admit: 2015-08-18 | Discharge: 2015-08-18 | Disposition: A | Discharge status: Home / Self Care | Payer: BC Managed Care – PPO | Source: Ambulatory Visit | Attending: Cardiology | Admitting: Cardiology

## 2015-08-18 DIAGNOSIS — I252 Old myocardial infarction: Secondary | ICD-10-CM | POA: Diagnosis not present

## 2015-08-20 ENCOUNTER — Encounter (HOSPITAL_COMMUNITY)
Admission: RE | Admit: 2015-08-20 | Discharge: 2015-08-20 | Disposition: A | Discharge status: Home / Self Care | Payer: BC Managed Care – PPO | Source: Ambulatory Visit | Attending: Cardiology | Admitting: Cardiology

## 2015-08-20 DIAGNOSIS — I252 Old myocardial infarction: Secondary | ICD-10-CM | POA: Diagnosis not present

## 2015-08-20 DIAGNOSIS — Z9889 Other specified postprocedural states: Secondary | ICD-10-CM | POA: Insufficient documentation

## 2015-08-20 DIAGNOSIS — I2510 Atherosclerotic heart disease of native coronary artery without angina pectoris: Secondary | ICD-10-CM | POA: Insufficient documentation

## 2015-08-22 ENCOUNTER — Encounter (HOSPITAL_COMMUNITY): Payer: BC Managed Care – PPO

## 2015-08-25 ENCOUNTER — Encounter (HOSPITAL_COMMUNITY): Payer: BC Managed Care – PPO

## 2015-09-01 ENCOUNTER — Other Ambulatory Visit: Payer: Self-pay | Admitting: Internal Medicine

## 2015-09-01 ENCOUNTER — Other Ambulatory Visit: Payer: Self-pay | Admitting: *Deleted

## 2015-09-01 MED ORDER — TICAGRELOR 90 MG PO TABS: 90.0000 mg | ORAL_TABLET | Freq: Two times a day (BID) | ORAL | Status: DC

## 2015-09-01 NOTE — Telephone Encounter (Signed)
Pt requesting refills on Brilinta. Medication sent to pharmacy, pt has appt with Dr. Wyline MoodBranch 11/16

## 2015-09-03 ENCOUNTER — Ambulatory Visit (INDEPENDENT_AMBULATORY_CARE_PROVIDER_SITE_OTHER): Payer: BC Managed Care – PPO | Admitting: Cardiology

## 2015-09-03 ENCOUNTER — Encounter: Payer: Self-pay | Admitting: Cardiology

## 2015-09-03 VITALS — BP 126/73 | HR 71 | Ht 60.0 in | Wt 134.0 lb

## 2015-09-03 DIAGNOSIS — I2510 Atherosclerotic heart disease of native coronary artery without angina pectoris: Secondary | ICD-10-CM

## 2015-09-03 DIAGNOSIS — I10 Essential (primary) hypertension: Secondary | ICD-10-CM

## 2015-09-03 DIAGNOSIS — R0602 Shortness of breath: Secondary | ICD-10-CM

## 2015-09-03 DIAGNOSIS — E785 Hyperlipidemia, unspecified: Secondary | ICD-10-CM | POA: Diagnosis not present

## 2015-09-03 MED ORDER — RANITIDINE HCL 150 MG PO TABS: 150.0000 mg | ORAL_TABLET | Freq: Two times a day (BID) | ORAL | Status: DC

## 2015-09-03 MED ORDER — METOPROLOL TARTRATE 25 MG PO TABS: 12.5000 mg | ORAL_TABLET | Freq: Two times a day (BID) | ORAL | Status: DC

## 2015-09-03 NOTE — Patient Instructions (Addendum)
Your physician has recommended that you have a pulmonary function test. Pulmonary Function Tests are a group of tests that measure how well air moves in and out of your lungs. Office will contact with results via phone or letter.   Remain off of the Toprol XL. Change to Lopressor (Metoprolol Tart) 12.5mg  twice a day  (1/2 of a 25mg  tablet) - [facility] to [facility]. Begin Zantac 150mg  twice a day - buy over the counter Continue all other medications.   Your physician wants you to follow up in: 6 months.  You will receive a reminder letter in the mail one-two months in advance.  If you don't receive a letter, please call our office to schedule the follow up appointment

## 2015-09-03 NOTE — Progress Notes (Signed)
Patient ID: Andrea Herrera, female   DOB: 07-Jun-1951, 64 y.o.   MRN: [id number]     Clinical Summary Andrea Herrera is a 64 y.o.female seen today for follow up of the following medical problems.   1. CAD - admit 04/2015 with inferior STEMI, s/p DES x 2 to RCA. LVEF 55-60% by echo  - notes some chest pain better with belching only. Notes some occasional SOB. Former tobacco x 30 years.   2. HTN - compliant with meds - we had fairly recently increased her lopressor to 25mg  bid, however patient thought this caused SOB and throat swelling. It was decrased to 12.5mg  bid with resolution of symptoms, and norvasc was increased to 10mg  daily. Changed to Toprol XL 25mg  daily last visit for ease of administation. Toprol was too expensive so she is not taking.   3. Hyperlipidemia Last lipid panel showed  TC 180 TG 76 HDL 53 LDL 112    Past Medical History  Diagnosis Date  . Hypertension   . Pre-diabetes      No Known Allergies   Current Outpatient Prescriptions  Medication Sig Dispense Refill  . amLODipine (NORVASC) 10 MG tablet Take 1 tablet (10 mg total) by mouth daily. 90 tablet 3  . aspirin EC 81 MG tablet Take 1 tablet (81 mg total) by mouth daily. 30 tablet 3  . atorvastatin (LIPITOR) 80 MG tablet Take 1 tablet (80 mg total) by mouth daily at 6 PM. 30 tablet 3  . cholecalciferol (VITAMIN D) 1000 UNITS tablet Take 1,000 Units by mouth daily.    . Cyanocobalamin (VITAMIN B-12 CR PO) Take 500 mcg by mouth daily.     Marland Kitchen. lisinopril (PRINIVIL,ZESTRIL) 20 MG tablet Take 1 tablet (20 mg total) by mouth daily. 30 tablet 6  . metFORMIN (GLUCOPHAGE-XR) 500 MG 24 hr tablet Take 500 mg by mouth 2 (two) times daily with a meal.     . metoprolol succinate (TOPROL XL) 25 MG 24 hr tablet Take 1 tablet (25 mg total) by mouth daily. 90 tablet 3  . Multiple Vitamin (MULTIVITAMIN) tablet Take 1 tablet by mouth daily.    . nitroGLYCERIN (NITROSTAT) 0.4 MG SL tablet Place 1 tablet (0.4 mg total) under the  tongue every 5 (five) minutes x 3 doses as needed for chest pain. 30 tablet 0  . ticagrelor (BRILINTA) 90 MG TABS tablet Take 1 tablet (90 mg total) by mouth 2 (two) times daily. 60 tablet 3  . VITAMIN E PO Take 400 Int'l Units by mouth daily.      No current facility-administered medications for this visit.     Past Surgical History  Procedure Laterality Date  . Cardiac catheterization N/A 04/26/2015    Procedure: Left Heart Cath and Coronary Angiography;  Surgeon: Runell GessJonathan J Berry, MD;  [facility];  Service: Cardiovascular;  Laterality: N/A;     No Known Allergies    No family history on file.   Social History Andrea Herrera reports that she quit smoking about 4 months ago. Her smoking use included Cigarettes. She has a 2.5 pack-year smoking history. She does not have any smokeless tobacco history on file. Andrea Herrera reports that she does not drink alcohol.   Review of Systems CONSTITUTIONAL: No weight loss, fever, chills, weakness or fatigue.  HEENT: Eyes: No visual loss, blurred vision, double vision or yellow sclerae.No hearing loss, sneezing, congestion, runny nose or sore throat.  SKIN: No rash or itching.  CARDIOVASCULAR: per hpi RESPIRATORY: No  cough or sputum.  GASTROINTESTINAL: No anorexia, nausea, vomiting or diarrhea. No abdominal pain or blood.  GENITOURINARY: No burning on urination, no polyuria NEUROLOGICAL: No headache, dizziness, syncope, paralysis, ataxia, numbness or tingling in the extremities. No change in bowel or bladder control.  MUSCULOSKELETAL: No muscle, back pain, joint pain or stiffness.  LYMPHATICS: No enlarged nodes. No history of splenectomy.  PSYCHIATRIC: No history of depression or anxiety.  ENDOCRINOLOGIC: No reports of sweating, cold or heat intolerance. No polyuria or polydipsia.  Marland Kitchen   Physical Examination Filed Vitals:   09/03/15 1255  BP: 126/73  Pulse: 71   Filed Vitals:   09/03/15 1255  Height: 5' (1.524 m)    Weight: 134 lb (60.782 kg)    Gen: resting comfortably, no acute distress HEENT: no scleral icterus, pupils equal round and reactive, no palptable cervical adenopathy,  CV: RRR, no m/r/g, no jvd Resp: Clear to auscultation bilaterally GI: abdomen is soft, non-tender, non-distended, normal bowel sounds, no hepatosplenomegaly MSK: extremities are warm, no edema.  Skin: warm, no rash Neuro:  no focal deficits Psych: appropriate affect   Diagnostic Studies 04/2015 Cath  Mid RCA lesion, 99% stenosed. There is a 0% residual stenosis post intervention.  Prox RCA to Mid RCA lesion, 75% stenosed. There is a 0% residual stenosis post intervention.  The left ventricular systolic function is normal.    04/2015 Echo - Left ventricle: The cavity size was normal. Systolic function was normal. The estimated ejection fraction was in the range of 55% to 60%. Wall motion was normal; there were no regional wall motion abnormalities. Doppler parameters are consistent with abnormal left ventricular relaxation (grade 1 diastolic dysfunction).    Assessment and Plan   1. CAD - no current symptoms, continue current meds. Due to cost will change Topro XL back to lopressor.   2. HTN - at goal, continue current meds.   3. Hyperlipidemia - continue high dose statin in setting of known CAD  4. SOB - long history of tobacco use, will order PFTs   F/u 6 months  Antoine Poche, M.D.

## 2015-09-05 ENCOUNTER — Encounter: Payer: Self-pay | Admitting: Cardiology

## 2015-09-10 NOTE — Progress Notes (Signed)
Patient is discharged from [place] [facility] program today, 08/20/15 with 36 sessions.  She achieved LTG of 30 minutes of aerobic exercise at max met level of.  All patient vitals are WNL 3.34.  Patient has met with dietician.  Discharge instructions have been reviewed in detail and patient expressed an understanding of material given.  Patient plans to exercise at the [facility]. Cardiac Rehab will make 1 month, 6 month and 1 year call backs.  Patient had no complaints of any abnormal S/S or pain on their exit visit.  Patient finished post walk test.

## 2015-09-10 NOTE — Addendum Note (Signed)
Encounter addended by: Andrey Cotahristy M Leala Bryand, RN on: 09/10/2015  2:36 PM<BR>     Documentation filed: Clinical Notes, Notes Section

## 2015-09-10 NOTE — Addendum Note (Signed)
Encounter addended by: Julious PayerAlysia R Inola Lisle, CCT on: 09/10/2015  1:28 PM<BR>     Documentation filed: Flowsheet VN

## 2015-09-10 NOTE — Progress Notes (Signed)
Cardiac Rehabilitation Program Outcomes Report   Orientation:  05/22/15 Graduate Date:  08/20/15 Discharge Date:  08/20/15 # of sessions completed: 36  Cardiologist: Wyline MoodBranch Family MD:  Halina AndreasHoward  Class Time:  1545  A.  Exercise Program:  Tolerates exercise @ 3.34 METS for 15 minutes, Walk Test Results:  Post: 3.00 mets, Improved functional capacity  3.81 % and Improved  muscular strength  25.3 %  B.  Mental Health:  Good mental attitude, Health related anxiety, Significant family stress, Quality of Life (QOL)  improvements:  Overall  -16.77 %, Health/Functioning -17.43 %, Socioeconomics -21.19 %, Psych/Spiritual -6.67 %, Family -18.89 %   and PHQ-9: 1  C.  Education/Instruction/Skills  Accurately checks own pulse.  Rest:  82  Exercise:  124, Knows TH[employer] for exercise and Attended 13 education classes  Uses Perceived Exertion Scale and/or Dyspnea Scale  D.  Nutrition/Weight Control/Body Composition:  Adherence to prescribed nutrition program: good  and Patient has lost 0.4 kg   E.  Blood Lipids    Lab Results  Component Value Date   CHOL 180 04/26/2015   HDL 53 04/26/2015   LDLCALC 112* 04/26/2015   TRIG 76 04/26/2015   CHOLHDL 3.4 04/26/2015    F.  Lifestyle Changes:  Making positive lifestyle changes and Not smoking:  Quit 2016  G.  Symptoms noted with exercise:  Asymptomatic  Report Completed By:  Doretha Sou Tyronn Golda RN   Comments:  This is the patients graduation note for [facility].  Patient has progress well in the program and [facility]

## 2015-09-26 ENCOUNTER — Ambulatory Visit (HOSPITAL_COMMUNITY)
Admission: RE | Admit: 2015-09-26 | Discharge: 2015-09-26 | Disposition: A | Discharge status: Home / Self Care | Payer: BC Managed Care – PPO | Source: Ambulatory Visit | Attending: Cardiology | Admitting: Cardiology

## 2015-09-26 DIAGNOSIS — R0602 Shortness of breath: Secondary | ICD-10-CM | POA: Diagnosis present

## 2015-09-26 LAB — PULMONARY FUNCTION TEST
DL/VA % PRED: 99 %
DL/VA: 4.21 ml/min/mmHg/L
DLCO unc % pred: 79 %
DLCO unc: 14.94 ml/min/mmHg
FEF 25-75 POST: 3.32 L/s
FEF 25-75 Pre: 3.09 L/sec
FEF2575-%Change-Post: 7 %
FEF2575-%Pred-Post: 197 %
FEF2575-%Pred-Pre: 183 %
FEV1-%Change-Post: 2 %
FEV1-%Pred-Post: 135 %
FEV1-%Pred-Pre: 133 %
FEV1-POST: 2.27 L
FEV1-PRE: 2.22 L
FEV1FVC-%CHANGE-POST: 3 %
FEV1FVC-%Pred-Pre: 109 %
FEV6-%Change-Post: -1 %
FEV6-%Pred-Post: 123 %
FEV6-%Pred-Pre: 125 %
FEV6-POST: 2.53 L
FEV6-PRE: 2.57 L
FEV6FVC-%PRED-PRE: 104 %
FEV6FVC-%Pred-Post: 104 %
FVC-%Change-Post: -1 %
FVC-%PRED-POST: 118 %
FVC-%PRED-PRE: 119 %
FVC-PRE: 2.57 L
FVC-Post: 2.53 L
POST FEV6/FVC RATIO: 100 %
PRE FEV1/FVC RATIO: 87 %
PRE FEV6/FVC RATIO: 100 %
Post FEV1/FVC ratio: 90 %
RV % PRED: 77 %
RV: 1.43 L
TLC % pred: 85 %
TLC: 3.79 L

## 2015-09-26 MED ORDER — ALBUTEROL SULFATE (2.5 MG/3ML) 0.083% IN NEBU
2.5000 mg | INHALATION_SOLUTION | Freq: Once | RESPIRATORY_TRACT | Status: AC
  Administered 2015-09-26: 2.5 mg via RESPIRATORY_TRACT

## 2015-10-01 ENCOUNTER — Telehealth: Payer: Self-pay | Admitting: *Deleted

## 2015-10-01 NOTE — Telephone Encounter (Signed)
-----   Message from Antoine PocheJonathan F Branch, MD sent at 09/29/2015  3:22 PM EST ----- Breathing tests are normal  Dominga FerryJ Branch MD

## 2015-10-01 NOTE — Telephone Encounter (Signed)
Pt aware, routed to pcp 

## 2015-11-28 ENCOUNTER — Other Ambulatory Visit: Payer: Self-pay | Admitting: Internal Medicine

## 2015-11-29 ENCOUNTER — Telehealth: Payer: Self-pay | Admitting: Internal Medicine

## 2015-11-29 NOTE — Telephone Encounter (Signed)
On-Call Cardiology Note  I was contacted by the patient, who ran out of atorvastatin last night and was unable to get it refilled today.  I advised her that missing 2 days of atorvastatin is not dangerous but offered to call in a one-time supply to cover for the next few weeks.  She declined and stated that she will contact Dr. Verna Czech office on Monday to request a refill.  I will forward this to Dr. Wyline Mood for his review.  Yvonne Kendall, MD Cardiology Fellow

## 2015-12-01 ENCOUNTER — Other Ambulatory Visit: Payer: Self-pay | Admitting: *Deleted

## 2015-12-01 MED ORDER — ATORVASTATIN CALCIUM 80 MG PO TABS: 80.0000 mg | ORAL_TABLET | Freq: Every day | ORAL | Status: DC

## 2015-12-01 MED ORDER — TICAGRELOR 90 MG PO TABS: 90.0000 mg | ORAL_TABLET | Freq: Two times a day (BID) | ORAL | Status: DC

## 2015-12-01 MED ORDER — LISINOPRIL 20 MG PO TABS: 20.0000 mg | ORAL_TABLET | Freq: Every day | ORAL | Status: DC

## 2015-12-01 NOTE — Telephone Encounter (Signed)
Can we make sure her atorva was refilled  Dominga Ferry MD

## 2015-12-25 ENCOUNTER — Other Ambulatory Visit: Payer: Self-pay | Admitting: Cardiology

## 2015-12-25 NOTE — Telephone Encounter (Signed)
[facility] stated [facility] are getting [facility] ready for her now.  Not sure what the problem was.    Patient notified via voice mail on above.  # 30 + 6 refills sent in on 12/05/2015.

## 2015-12-25 NOTE — Telephone Encounter (Signed)
Patient came by office [facility]  and was told no refills.

## 2016-03-04 ENCOUNTER — Encounter: Payer: Self-pay | Admitting: *Deleted

## 2016-03-04 ENCOUNTER — Encounter: Payer: Self-pay | Admitting: Cardiology

## 2016-03-04 ENCOUNTER — Ambulatory Visit (INDEPENDENT_AMBULATORY_CARE_PROVIDER_SITE_OTHER): Payer: BC Managed Care – PPO | Admitting: Cardiology

## 2016-03-04 VITALS — BP 107/67 | HR 83 | Ht 60.0 in | Wt 132.0 lb

## 2016-03-04 DIAGNOSIS — E785 Hyperlipidemia, unspecified: Secondary | ICD-10-CM | POA: Diagnosis not present

## 2016-03-04 DIAGNOSIS — I10 Essential (primary) hypertension: Secondary | ICD-10-CM

## 2016-03-04 DIAGNOSIS — I2510 Atherosclerotic heart disease of native coronary artery without angina pectoris: Secondary | ICD-10-CM

## 2016-03-04 NOTE — Patient Instructions (Signed)
Your physician wants you to follow-up in: 6 MONTHS WITH DR. BRANCH You will receive a reminder letter in the mail two months in advance. If you don't receive a letter, please call our office to schedule the follow-up appointment.  Your physician has recommended you make the following change in your medication:   STOP BRILINTA JULY 18TH 2017  [facility] choosing [facility]!!

## 2016-03-04 NOTE — Progress Notes (Signed)
Patient ID: URA YINGLING, female   DOB: 1951/09/27, 65 y.o.   MRN: [id number]     Clinical Summary Ms. Gatchel is a 65 y.o.female seen today for follow up of the following medical problems.   1. CAD - admit 04/2015 with inferior STEMI, s/p DES x 2 to RCA. LVEF 55-60% by echo - notes some occasional mild chest pain. Better with belching. Feels a fullness after meals. Only taking zantac prn.  - compliant with meds.    2. HTN - compliant with meds   3. Hyperlipidemia Last lipid panel showedTC 180 TG 76 HDL 53 LDL 112 - compliant with staitn   SH: twin brother has pancreatic cancer. He underwent a Whipple procedure but appears he has had a recurrence. She spends much of her free time with him.     Past Medical History  Diagnosis Date  . Hypertension   . Pre-diabetes      No Known Allergies   Current Outpatient Prescriptions  Medication Sig Dispense Refill  . amLODipine (NORVASC) 10 MG tablet Take 1 tablet (10 mg total) by mouth daily. 90 tablet 3  . aspirin EC 81 MG tablet Take 1 tablet (81 mg total) by mouth daily. 30 tablet 3  . atorvastatin (LIPITOR) 80 MG tablet Take 1 tablet (80 mg total) by mouth daily at 6 PM. 30 tablet 6  . cholecalciferol (VITAMIN D) 1000 UNITS tablet Take 1,000 Units by mouth daily.    . Cyanocobalamin (VITAMIN B-12 CR PO) Take 500 mcg by mouth daily.     Marland Kitchen lisinopril (PRINIVIL,ZESTRIL) 20 MG tablet Take 1 tablet (20 mg total) by mouth daily. 30 tablet 6  . metFORMIN (GLUCOPHAGE-XR) 500 MG 24 hr tablet Take 500 mg by mouth 2 (two) times daily with a meal.     . metoprolol tartrate (LOPRESSOR) 25 MG tablet Take 0.5 tablets (12.5 mg total) by mouth 2 (two) times daily. 30 tablet 6  . Multiple Vitamin (MULTIVITAMIN) tablet Take 1 tablet by mouth daily.    . nitroGLYCERIN (NITROSTAT) 0.4 MG SL tablet Place 1 tablet (0.4 mg total) under the tongue every 5 (five) minutes x 3 doses as needed for chest pain. 30 tablet 0  . ranitidine (ZANTAC) 150 MG  tablet Take 1 tablet (150 mg total) by mouth 2 (two) times daily.    . ticagrelor (BRILINTA) 90 MG TABS tablet Take 1 tablet (90 mg total) by mouth 2 (two) times daily. 60 tablet 6  . VITAMIN E PO Take 400 Int'l Units by mouth daily.      No current facility-administered medications for this visit.     Past Surgical History  Procedure Laterality Date  . Cardiac catheterization N/A 04/26/2015    Procedure: Left Heart Cath and Coronary Angiography;  Surgeon: Runell Gess, MD;  Location: [facility] INVASIVE CV LAB;  Service: Cardiovascular;  Laterality: N/A;     No Known Allergies    Family History  Problem Relation Age of Onset  . Colon cancer Mother   . Lung cancer Father   . Stroke Father   . Pancreatic cancer Brother   . Heart disease Paternal Grandmother   . Stroke Paternal Grandmother   . Heart disease Paternal Grandfather   . Stroke Paternal Grandfather      Social History Ms. Artiaga reports that she quit smoking about 10 months ago. Her smoking use included Cigarettes. She started smoking about 28 years ago. She has a 5.5 pack-year smoking history. She has never used smokeless  tobacco. [name] reports that she does not drink alcohol.   Review of Systems CONSTITUTIONAL: No weight loss, fever, chills, weakness or fatigue.  HEENT: Eyes: No visual loss, blurred vision, double vision or yellow sclerae.No hearing loss, sneezing, congestion, runny nose or sore throat.  SKIN: No rash or itching.  CARDIOVASCULAR: per HPI RESPIRATORY: No shortness of breath, cough or sputum.  GASTROINTESTINAL: No anorexia, nausea, vomiting or diarrhea. No abdominal pain or blood.  GENITOURINARY: No burning on urination, no polyuria NEUROLOGICAL: No headache, dizziness, syncope, paralysis, ataxia, numbness or tingling in the extremities. No change in bowel or bladder control.  MUSCULOSKELETAL: No muscle, back pain, joint pain or stiffness.  LYMPHATICS: No enlarged nodes. No history of splenectomy.   PSYCHIATRIC: No history of depression or anxiety.  ENDOCRINOLOGIC: No reports of sweating, cold or heat intolerance. No polyuria or polydipsia.  Marland Kitchen.   Physical Examination Filed Vitals:   03/04/16 1500  BP: 107/67  Pulse: 83   Filed Vitals:   03/04/16 1500  Height: 5' (1.524 m)  Weight: 132 lb (59.875 kg)    Gen: resting comfortably, no acute distress HEENT: no scleral icterus, pupils equal round and reactive, no palptable cervical adenopathy,  CV: RRR, no m/r/g, no jvd Resp: Clear to auscultation bilaterally GI: abdomen is soft, non-tender, non-distended, normal bowel sounds, no hepatosplenomegaly MSK: extremities are warm, no edema.  Skin: warm, no rash Neuro:  no focal deficits Psych: appropriate affect   Diagnostic Studies 04/2015 Cath  Mid RCA lesion, 99% stenosed. There is a 0% residual stenosis post intervention.  Prox RCA to Mid RCA lesion, 75% stenosed. There is a 0% residual stenosis post intervention.  The left ventricular systolic function is normal.    04/2015 Echo - Left ventricle: The cavity size was normal. Systolic function was normal. The estimated ejection fraction was in the range of 55% to 60%. Wall motion was normal; there were no regional wall motion abnormalities. Doppler parameters are consistent with abnormal left ventricular relaxation (grade 1 diastolic dysfunction).  09/2015 PFTs No COPD  Assessment and Plan   1. CAD - no current chest pain, occasional GI pain. Encouraged to take zantac bid instead just as needed. - continue current meds. She will stop brillinta in 04/2016.   2. HTN - at goal, we will continue current meds.   3. Hyperlipidemia - continue high dose statin in setting of known CAD - request labs from pcp      F/u 6 months Antoine PocheJonathan F. Olyvia Gopal, M.D.

## 2016-07-21 ENCOUNTER — Other Ambulatory Visit (HOSPITAL_COMMUNITY): Payer: Self-pay | Admitting: Family Medicine

## 2016-07-21 DIAGNOSIS — Z1231 Encounter for screening mammogram for malignant neoplasm of breast: Secondary | ICD-10-CM

## 2016-07-23 ENCOUNTER — Other Ambulatory Visit: Payer: Self-pay | Admitting: Cardiology

## 2016-08-09 ENCOUNTER — Ambulatory Visit (HOSPITAL_COMMUNITY)
Admission: RE | Admit: 2016-08-09 | Discharge: 2016-08-09 | Disposition: A | Discharge status: Home / Self Care | Payer: BC Managed Care – PPO | Source: Ambulatory Visit | Attending: Family Medicine | Admitting: Family Medicine

## 2016-08-09 DIAGNOSIS — Z1231 Encounter for screening mammogram for malignant neoplasm of breast: Secondary | ICD-10-CM | POA: Diagnosis not present

## 2016-09-13 ENCOUNTER — Encounter: Payer: Self-pay | Admitting: *Deleted

## 2016-09-14 ENCOUNTER — Ambulatory Visit (INDEPENDENT_AMBULATORY_CARE_PROVIDER_SITE_OTHER): Payer: BC Managed Care – PPO | Admitting: Cardiology

## 2016-09-14 ENCOUNTER — Encounter: Payer: Self-pay | Admitting: *Deleted

## 2016-09-14 ENCOUNTER — Encounter: Payer: Self-pay | Admitting: Cardiology

## 2016-09-14 VITALS — BP 106/68 | HR 78 | Ht 60.0 in | Wt 127.4 lb

## 2016-09-14 DIAGNOSIS — I10 Essential (primary) hypertension: Secondary | ICD-10-CM

## 2016-09-14 DIAGNOSIS — R079 Chest pain, unspecified: Secondary | ICD-10-CM

## 2016-09-14 DIAGNOSIS — E782 Mixed hyperlipidemia: Secondary | ICD-10-CM

## 2016-09-14 DIAGNOSIS — I2510 Atherosclerotic heart disease of native coronary artery without angina pectoris: Secondary | ICD-10-CM

## 2016-09-14 NOTE — Patient Instructions (Signed)
Your physician recommends that you schedule a follow-up appointment in: 6 WEEKS WITH DR. BRANCH   Your physician has recommended you make the following change in your medication:   HOLD LIPITOR FOR 3 WEEKS AND CALL US WITH AN UPDATE   Thank you for choosing [facility]!!

## 2016-09-14 NOTE — Progress Notes (Signed)
Clinical Summary Andrea Herrera is a 65 y.o.female seen today for follow up of the following medical problems.   1. CAD - admit 04/2015 with inferior STEMI, s/p DES x 2 to RCA. LVEF 55-60% by echo - chest pain started around June. Sharp like pain, worst with lifting arm. Lasts few minutes. Worst with laying on left side. No exertional chest pain.  - doing cardiac rehab - compliant with meds  2. HTN - compliant with meds - notes some occasional borderline low bp's, though not often  3. Hyperlipidemia - compliant with statin    SH: twin brother has pancreatic cancer. He underwent a Whipple procedure but appears he has had a recurrence. She spends much of her free time with him.  - brother recently passed July.  Past Medical History:  Diagnosis Date  . Hypertension   . Pre-diabetes      No Known Allergies   Current Outpatient Prescriptions  Medication Sig Dispense Refill  . amLODipine (NORVASC) 10 MG tablet Take 1 tablet (10 mg total) by mouth daily. 90 tablet 3  . aspirin EC 81 MG tablet Take 1 tablet (81 mg total) by mouth daily. 30 tablet 3  . atorvastatin (LIPITOR) 80 MG tablet Take 1 tablet (80 mg total) by mouth daily at 6 PM. 30 tablet 6  . cholecalciferol (VITAMIN D) 1000 UNITS tablet Take 1,000 Units by mouth daily.    . Cyanocobalamin (VITAMIN B-12 CR PO) Take 500 mcg by mouth daily.     Marland Kitchen. lisinopril (PRINIVIL,ZESTRIL) 20 MG tablet TAKE ONE TABLET BY MOUTH ONCE DAILY 30 tablet 6  . metFORMIN (GLUCOPHAGE-XR) 500 MG 24 hr tablet Take 500 mg by mouth 2 (two) times daily with a meal.     . metoprolol tartrate (LOPRESSOR) 25 MG tablet Take 0.5 tablets (12.5 mg total) by mouth 2 (two) times daily. 30 tablet 6  . Multiple Vitamin (MULTIVITAMIN) tablet Take 1 tablet by mouth daily.    . nitroGLYCERIN (NITROSTAT) 0.4 MG SL tablet Place 1 tablet (0.4 mg total) under the tongue every 5 (five) minutes x 3 doses as needed for chest pain. 30 tablet 0  . ranitidine (ZANTAC) 150  MG tablet Take 1 tablet (150 mg total) by mouth 2 (two) times daily. (Patient taking differently: Take 150 mg by mouth as needed. )    . ticagrelor (BRILINTA) 90 MG TABS tablet Take 1 tablet (90 mg total) by mouth 2 (two) times daily. 60 tablet 6  . VITAMIN E PO Take 400 Int'l Units by mouth daily.      No current facility-administered medications for this visit.      Past Surgical History:  Procedure Laterality Date  . CARDIAC CATHETERIZATION N/A 04/26/2015   Procedure: Left Heart Cath and Coronary Angiography;  Surgeon: Andrea GessJonathan J Berry, MD;  [facility];  Service: Cardiovascular;  Laterality: N/A;     No Known Allergies    Family History  Problem Relation Age of Onset  . Colon cancer Mother   . Lung cancer Father   . Stroke Father   . Pancreatic cancer Brother   . Heart disease Paternal Grandmother   . Stroke Paternal Grandmother   . Heart disease Paternal Grandfather   . Stroke Paternal Grandfather      Social History Andrea Herrera reports that she quit smoking about 16 months ago. Her smoking use included Cigarettes. She started smoking about 29 years ago. She has a 5.50 pack-year smoking history. She has  never used smokeless tobacco. Andrea Herrera reports that she does not dMarvis Moellerrink alcohol.   Review of Systems CONSTITUTIONAL: No weight loss, fever, chills, weakness or fatigue.  HEENT: Eyes: No visual loss, blurred vision, double vision or yellow sclerae.No hearing loss, sneezing, congestion, runny nose or sore throat.  SKIN: No rash or itching.  CARDIOVASCULAR: per HPI RESPIRATORY: No shortness of breath, cough or sputum.  GASTROINTESTINAL: No anorexia, nausea, vomiting or diarrhea. No abdominal pain or blood.  GENITOURINARY: No burning on urination, no polyuria NEUROLOGICAL: No headache, dizziness, syncope, paralysis, ataxia, numbness or tingling in the extremities. No change in bowel or bladder control.  MUSCULOSKELETAL: left shoulder pain LYMPHATICS: No  enlarged nodes. No history of splenectomy.  PSYCHIATRIC: No history of depression or anxiety.  ENDOCRINOLOGIC: No reports of sweating, cold or heat intolerance. No polyuria or polydipsia.  Marland Kitchen.   Physical Examination Vitals:   09/14/16 1306  BP: 106/68  Pulse: 78   Vitals:   09/14/16 1306  Weight: 127 lb 6.4 oz (57.8 kg)  Height: 5' (1.524 m)    Gen: resting comfortably, no acute distress HEENT: no scleral icterus, pupils equal round and reactive, no palptable cervical adenopathy,  CV: RRR, no m/r/g, no jvd Resp: Clear to auscultation bilaterally GI: abdomen is soft, non-tender, non-distended, normal bowel sounds, no hepatosplenomegaly MSK: extremities are warm, no edema.  Skin: warm, no rash Neuro:  no focal deficits Psych: appropriate affect   Diagnostic Studies 04/2015 Cath  Mid RCA lesion, 99% stenosed. There is a 0% residual stenosis post intervention.  Prox RCA to Mid RCA lesion, 75% stenosed. There is a 0% residual stenosis post intervention.  The left ventricular systolic function is normal.    04/2015 Echo - Left ventricle: The cavity size was normal. Systolic function was normal. The estimated ejection fraction was in the range of 55% to 60%. Wall motion was normal; there were no regional wall motion abnormalities. Doppler parameters are consistent with abnormal left ventricular relaxation (grade 1 diastolic dysfunction).  09/2015 PFTs No COPD    Assessment and Plan    1. CAD - no current chest pain, left shoulder and MSK pain - continue current meds.  - EKG in clniic shows SR, no ischemic changes  2. HTN - at goal, continue current meds.   3. Hyperlipidemia -she is worried her statin may be causing some of her shoulder pains. I think unlikely, but we will try holding for 3 weeks and follow symptoms. She will contact us in 3 weeks with update.   F/u 6 months. Request pcp labs.   Antoine PocheJonathan F. Taiwana Herrera, M.D.

## 2016-10-06 ENCOUNTER — Telehealth: Payer: Self-pay | Admitting: *Deleted

## 2016-10-06 NOTE — Telephone Encounter (Signed)
Pt f/u on holding Lipitor - says still having shoulder pain - sees Dr. Case this afternoon - pt doesn't think Lipitor was causing pain

## 2016-10-07 NOTE — Telephone Encounter (Signed)
I agree, please restart lipitor   Dominga FerryJ Ledonna Dormer MD

## 2016-10-08 NOTE — Telephone Encounter (Signed)
Pt aware and will restart lipitor

## 2016-10-19 ENCOUNTER — Other Ambulatory Visit: Payer: Self-pay | Admitting: Cardiology

## 2016-10-26 ENCOUNTER — Ambulatory Visit (INDEPENDENT_AMBULATORY_CARE_PROVIDER_SITE_OTHER): Payer: BC Managed Care – PPO | Admitting: Cardiology

## 2016-10-26 ENCOUNTER — Encounter: Payer: Self-pay | Admitting: Cardiology

## 2016-10-26 VITALS — BP 111/70 | HR 74 | Ht 60.0 in | Wt 126.2 lb

## 2016-10-26 DIAGNOSIS — R079 Chest pain, unspecified: Secondary | ICD-10-CM | POA: Diagnosis not present

## 2016-10-26 DIAGNOSIS — I2510 Atherosclerotic heart disease of native coronary artery without angina pectoris: Secondary | ICD-10-CM

## 2016-10-26 NOTE — Patient Instructions (Signed)

## 2016-10-26 NOTE — Progress Notes (Signed)
Clinical Summary [name] is a 66 y.o.female seen today for follow up of the following medical problems. This is a focused visit on recent atypical chest/shoulder pain.   1. CAD - admit 04/2015 with inferior STEMI, s/p DES x 2 to RCA. LVEF 55-60% by echo - chest pain started around June. Sharp like pain, worst with lifting arm. Lasts few minutes. Worst with laying on left side. No exertional chest pain.   symptoms unchanged since last visit. Seen by ortho, received shoulder injection with only mild improvement.      SH: twin brother has pancreatic cancer. He underwent a Whipple procedure but appears he has had a recurrence. She spends much of her free time with him.  - brother recently passed July.    Past Medical History:  Diagnosis Date  . Hypertension   . Pre-diabetes      No Known Allergies   Current Outpatient Prescriptions  Medication Sig Dispense Refill  . amLODipine (NORVASC) 10 MG tablet Take 1 tablet (10 mg total) by mouth daily. 90 tablet 3  . aspirin EC 81 MG tablet Take 1 tablet (81 mg total) by mouth daily. 30 tablet 3  . atorvastatin (LIPITOR) 80 MG tablet Take 1 tablet (80 mg total) by mouth daily at 6 PM. 30 tablet 6  . cholecalciferol (VITAMIN D) 1000 UNITS tablet Take 1,000 Units by mouth daily.    . Cyanocobalamin (VITAMIN B-12 CR PO) Take 500 mcg by mouth daily.     Marland Kitchen. lisinopril (PRINIVIL,ZESTRIL) 20 MG tablet TAKE ONE TABLET BY MOUTH ONCE DAILY 30 tablet 6  . metFORMIN (GLUCOPHAGE-XR) 500 MG 24 hr tablet Take 500 mg by mouth 2 (two) times daily with a meal.     . metoprolol tartrate (LOPRESSOR) 25 MG tablet Take 0.5 tablets (12.5 mg total) by mouth 2 (two) times daily. 30 tablet 6  . metoprolol tartrate (LOPRESSOR) 25 MG tablet TAKE ONE TABLET BY MOUTH TWICE DAILY 180 tablet 3  . Multiple Vitamin (MULTIVITAMIN) tablet Take 1 tablet by mouth daily.    . nitroGLYCERIN (NITROSTAT) 0.4 MG SL tablet Place 1 tablet (0.4 mg total) under the tongue every  5 (five) minutes x 3 doses as needed for chest pain. 30 tablet 0  . ranitidine (ZANTAC) 150 MG tablet Take 1 tablet (150 mg total) by mouth 2 (two) times daily. (Patient taking differently: Take 150 mg by mouth as needed. )    . VITAMIN E PO Take 400 Int'l Units by mouth daily.      No current facility-administered medications for this visit.      Past Surgical History:  Procedure Laterality Date  . CARDIAC CATHETERIZATION N/A 04/26/2015   Procedure: Left Heart Cath and Coronary Angiography;  Surgeon: Runell GessJonathan J Berry, MD;  [facility];  Service: Cardiovascular;  Laterality: N/A;     No Known Allergies    Family History  Problem Relation Age of Onset  . Colon cancer Mother   . Lung cancer Father   . Stroke Father   . Pancreatic cancer Brother   . Heart disease Paternal Grandmother   . Stroke Paternal Grandmother   . Heart disease Paternal Grandfather   . Stroke Paternal Grandfather      Social History [name] reports that she quit smoking about 18 months ago. Her smoking use included Cigarettes. She started smoking about 29 years ago. She has a 5.50 pack-year smoking history. She has never used smokeless tobacco. [name] reports  that she does not drink alcohol.   Review of Systems CONSTITUTIONAL: No weight loss, fever, chills, weakness or fatigue.  HEENT: Eyes: No visual loss, blurred vision, double vision or yellow sclerae.No hearing loss, sneezing, congestion, runny nose or sore throat.  SKIN: No rash or itching.  CARDIOVASCULAR: per hpi RESPIRATORY: No shortness of breath, cough or sputum.  GASTROINTESTINAL: No anorexia, nausea, vomiting or diarrhea. No abdominal pain or blood.  GENITOURINARY: No burning on urination, no polyuria NEUROLOGICAL: No headache, dizziness, syncope, paralysis, ataxia, numbness or tingling in the extremities. No change in bowel or bladder control.  MUSCULOSKELETAL: bilatearl shoulder paiin LYMPHATICS: No enlarged nodes. No  history of splenectomy.  PSYCHIATRIC: No history of depression or anxiety.  ENDOCRINOLOGIC: No reports of sweating, cold or heat intolerance. No polyuria or polydipsia.  Marland Kitchen   Physical Examination Vitals:   10/26/16 1335  BP: 111/70  Pulse: 74   Vitals:   10/26/16 1335  Weight: 126 lb 3.2 oz (57.2 kg)  Height: 5' (1.524 m)    Gen: resting comfortably, no acute distress HEENT: no scleral icterus, pupils equal round and reactive, no palptable cervical adenopathy,  CV: RRR, no m/r/g, no jvd Resp: Clear to auscultation bilaterally GI: abdomen is soft, non-tender, non-distended, normal bowel sounds, no hepatosplenomegaly MSK: extremities are warm, no edema.  Skin: warm, no rash Neuro:  no focal deficits Psych: appropriate affect   Diagnostic Studies 04/2015 Cath  Mid RCA lesion, 99% stenosed. There is a 0% residual stenosis post intervention.  Prox RCA to Mid RCA lesion, 75% stenosed. There is a 0% residual stenosis post intervention.  The left ventricular systolic function is normal.    04/2015 Echo - Left ventricle: The cavity size was normal. Systolic function was normal. The estimated ejection fraction was in the range of 55% to 60%. Wall motion was normal; there were no regional wall motion abnormalities. Doppler parameters are consistent with abnormal left ventricular relaxation (grade 1 diastolic dysfunction).  09/2015 PFTs No COPD    Assessment and Plan   1. CAD - recent pain is noncardiac. MSK shoulder pain that radiates into neck and check - no further cardiac workup is indicated at this time.       Antoine Poche, M.D.

## 2017-02-08 ENCOUNTER — Other Ambulatory Visit: Payer: Self-pay | Admitting: Cardiology

## 2017-02-18 ENCOUNTER — Other Ambulatory Visit: Payer: Self-pay | Admitting: Cardiology

## 2017-05-05 ENCOUNTER — Ambulatory Visit (INDEPENDENT_AMBULATORY_CARE_PROVIDER_SITE_OTHER): Payer: BC Managed Care – PPO | Admitting: Cardiology

## 2017-05-05 ENCOUNTER — Encounter: Payer: Self-pay | Admitting: Cardiology

## 2017-05-05 ENCOUNTER — Encounter: Payer: Self-pay | Admitting: *Deleted

## 2017-05-05 VITALS — BP 122/70 | HR 84 | Ht 60.0 in | Wt 126.0 lb

## 2017-05-05 DIAGNOSIS — E782 Mixed hyperlipidemia: Secondary | ICD-10-CM

## 2017-05-05 DIAGNOSIS — I10 Essential (primary) hypertension: Secondary | ICD-10-CM | POA: Diagnosis not present

## 2017-05-05 DIAGNOSIS — I2510 Atherosclerotic heart disease of native coronary artery without angina pectoris: Secondary | ICD-10-CM | POA: Diagnosis not present

## 2017-05-05 NOTE — Patient Instructions (Signed)

## 2017-05-05 NOTE — Progress Notes (Signed)
Clinical Summary Andrea Herrera is a 66 y.o.female seen today for follow up of the following medical problems.   1. CAD - admit 04/2015 with inferior STEMI, s/p DES x 2 to RCA. LVEF 55-60% by echo - chest pain started around June. Sharp like pain, worst with lifting arm. Lasts few minutes. Worst with laying on left side. No exertional chest pain.     - no recent chest pain. Prior symptoms were atypical. Much improved after second injection in her shoulder.  - compliant with meds.  2. HTN - she is compliant with meds  3. Hyperlipidemia - compliant with statin - she reports most recent labs are with pcp   SH: twin brother has pancreatic cancer. He underwent a Whipple procedure but appears he has had a recurrence. She spends much of her free time with him.  - brother recently passed July.  Past Medical History:  Diagnosis Date  . Hypertension   . Pre-diabetes      No Known Allergies   Current Outpatient Prescriptions  Medication Sig Dispense Refill  . amLODipine (NORVASC) 10 MG tablet Take 1 tablet (10 mg total) by mouth daily. 90 tablet 3  . aspirin EC 81 MG tablet Take 1 tablet (81 mg total) by mouth daily. 30 tablet 3  . atorvastatin (LIPITOR) 80 MG tablet TAKE ONE TABLET BY MOUTH ONCE DAILY AT 6 PM 30 tablet 6  . cholecalciferol (VITAMIN D) 1000 UNITS tablet Take 1,000 Units by mouth daily.    . Cyanocobalamin (VITAMIN B-12 CR PO) Take 500 mcg by mouth daily.     Marland Kitchen. lisinopril (PRINIVIL,ZESTRIL) 20 MG tablet TAKE ONE TABLET BY MOUTH ONCE DAILY 30 tablet 6  . metFORMIN (GLUCOPHAGE-XR) 500 MG 24 hr tablet Take 500 mg by mouth 2 (two) times daily with a meal.     . metoprolol tartrate (LOPRESSOR) 25 MG tablet Take 0.5 tablets (12.5 mg total) by mouth 2 (two) times daily. 30 tablet 6  . Multiple Vitamin (MULTIVITAMIN) tablet Take 1 tablet by mouth daily.    . nitroGLYCERIN (NITROSTAT) 0.4 MG SL tablet Place 1 tablet (0.4 mg total) under the tongue every 5 (five) minutes x  3 doses as needed for chest pain. 30 tablet 0  . ranitidine (ZANTAC) 150 MG tablet Take 1 tablet (150 mg total) by mouth 2 (two) times daily. (Patient taking differently: Take 150 mg by mouth as needed. )     No current facility-administered medications for this visit.      Past Surgical History:  Procedure Laterality Date  . CARDIAC CATHETERIZATION N/A 04/26/2015   Procedure: Left Heart Cath and Coronary Angiography;  Surgeon: Runell GessJonathan J Berry, MD;  [facility];  Service: Cardiovascular;  Laterality: N/A;     No Known Allergies    Family History  Problem Relation Age of Onset  . Colon cancer Mother   . Lung cancer Father   . Stroke Father   . Pancreatic cancer Brother   . Heart disease Paternal Grandmother   . Stroke Paternal Grandmother   . Heart disease Paternal Grandfather   . Stroke Paternal Grandfather      Social History Andrea Herrera reports that she quit smoking about 2 years ago. Her smoking use included Cigarettes. She started smoking about 29 years ago. She has a 5.50 pack-year smoking history. She has never used smokeless tobacco. Andrea Herrera reports that she does not drink alcohol.   Review of Systems CONSTITUTIONAL: No weight loss, fever,  chills, weakness or fatigue.  HEENT: Eyes: No visual loss, blurred vision, double vision or yellow sclerae.No hearing loss, sneezing, congestion, runny nose or sore throat.  SKIN: No rash or itching.  CARDIOVASCULAR: per hpi RESPIRATORY: No shortness of breath, cough or sputum.  GASTROINTESTINAL: No anorexia, nausea, vomiting or diarrhea. No abdominal pain or blood.  GENITOURINARY: No burning on urination, no polyuria NEUROLOGICAL: No headache, dizziness, syncope, paralysis, ataxia, numbness or tingling in the extremities. No change in bowel or bladder control.  MUSCULOSKELETAL: No muscle, back pain, joint pain or stiffness.  LYMPHATICS: No enlarged nodes. No history of splenectomy.  PSYCHIATRIC: No history of  depression or anxiety.  ENDOCRINOLOGIC: No reports of sweating, cold or heat intolerance. No polyuria or polydipsia.  Marland Kitchen   Physical Examination Vitals:   05/05/17 1525  BP: 122/70  Pulse: 84   Filed Weights   05/05/17 1525  Weight: 126 lb (57.2 kg)    Gen: resting comfortably, no acute distress HEENT: no scleral icterus, pupils equal round and reactive, no palptable cervical adenopathy,  CV: RRR, no m/r/g, no jvd Resp: Clear to auscultation bilaterally GI: abdomen is soft, non-tender, non-distended, normal bowel sounds, no hepatosplenomegaly MSK: extremities are warm, no edema.  Skin: warm, no rash Neuro:  no focal deficits Psych: appropriate affect   Diagnostic Studies 04/2015 Cath  Mid RCA lesion, 99% stenosed. There is a 0% residual stenosis post intervention.  Prox RCA to Mid RCA lesion, 75% stenosed. There is a 0% residual stenosis post intervention.  The left ventricular systolic function is normal.    04/2015 Echo - Left ventricle: The cavity size was normal. Systolic function was normal. The estimated ejection fraction was in the range of 55% to 60%. Wall motion was normal; there were no regional wall motion abnormalities. Doppler parameters are consistent with abnormal left ventricular relaxation (grade 1 diastolic dysfunction).  09/2015 PFTs No COPD    Assessment and Plan  1. CAD - no recent symptoms - continue current meds   2. HTN - bp is at goal, continue current meds   3. Hyperlipidemia -request labs from pcp, continue current statin  F/u 1 year   Antoine Poche, M.D.

## 2017-06-29 IMAGING — DX DG CHEST 2V
2 series · 2 of 2 positions shown · non-contrast
Comparison: None.

CLINICAL DATA: Myocardial infarction.

EXAM:
CHEST  2 VIEW

[chest pa]
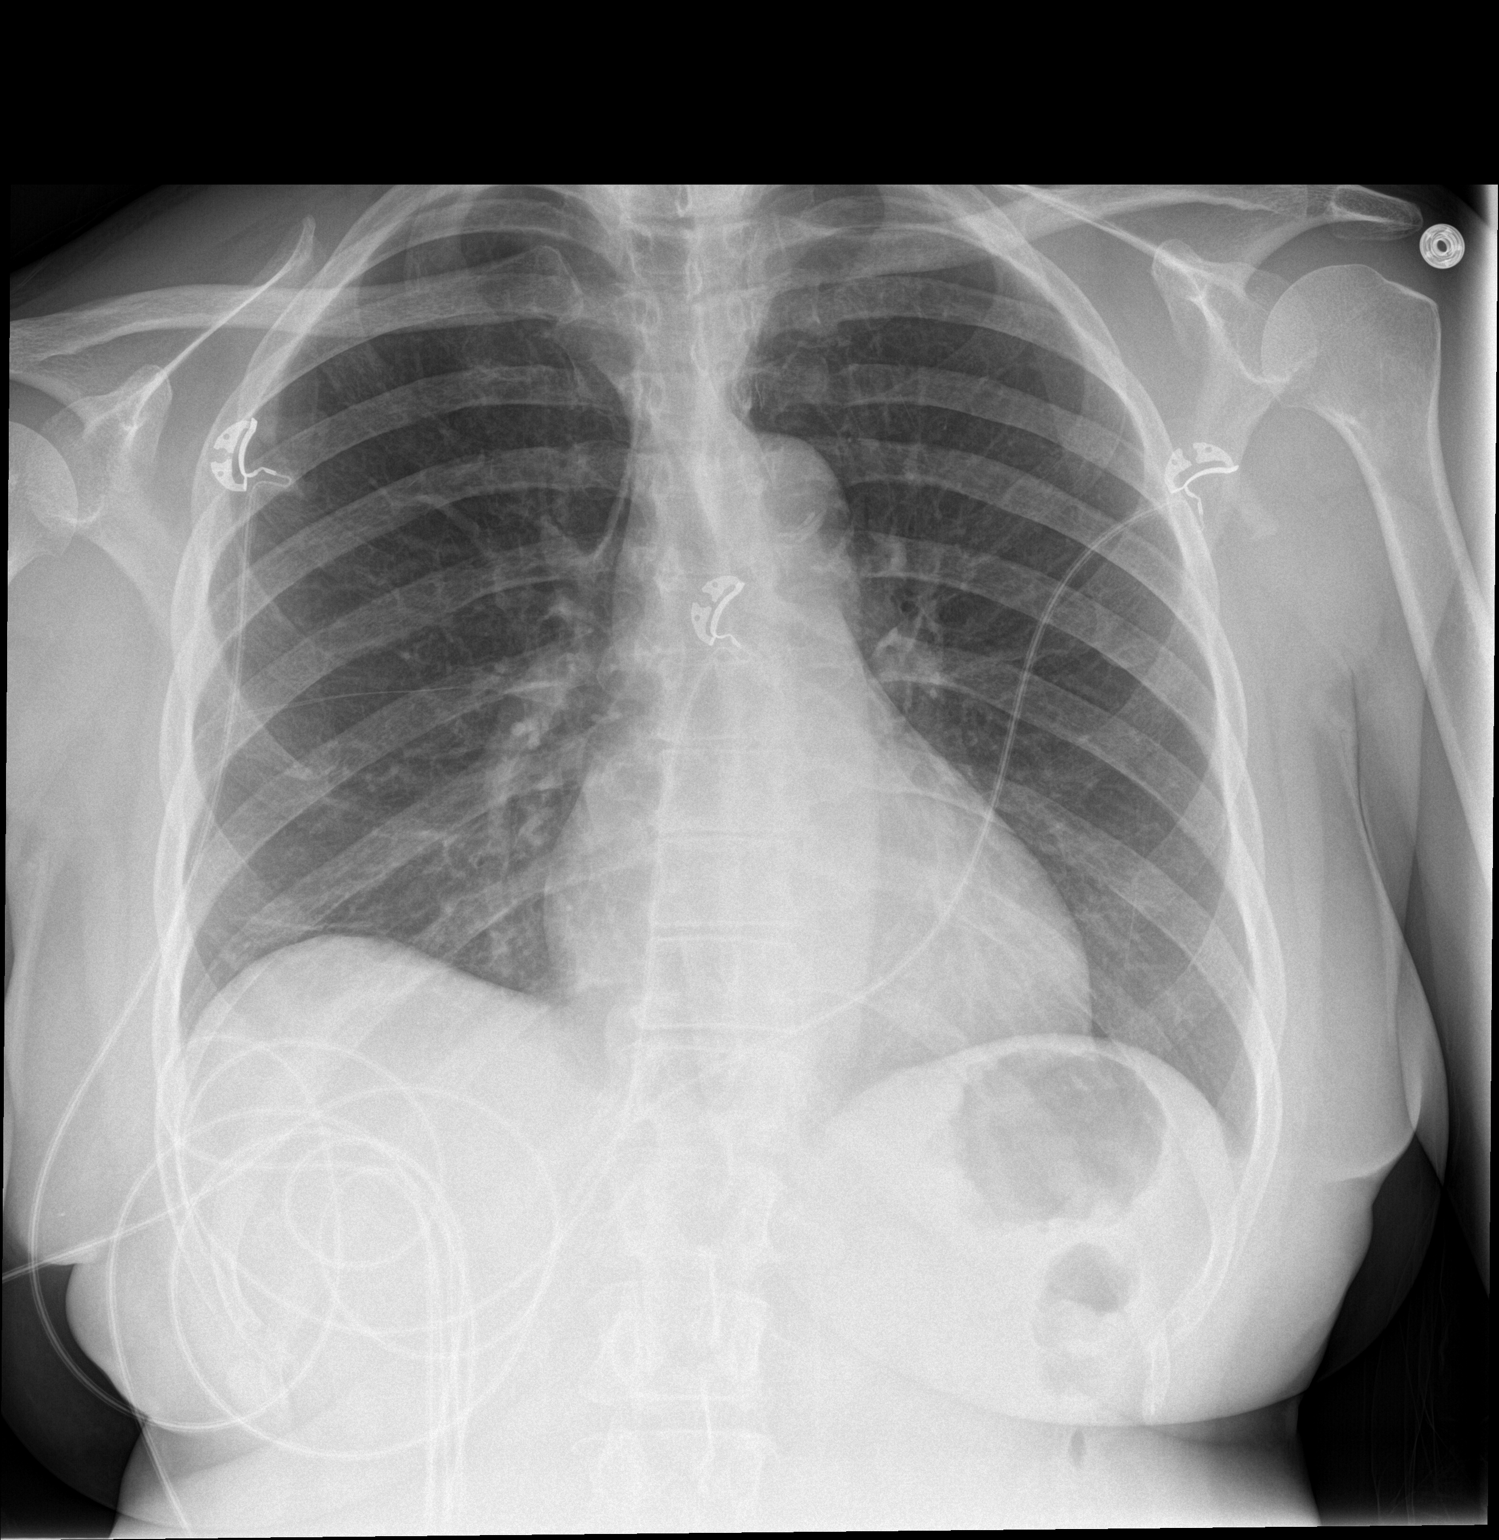

[chest lat]
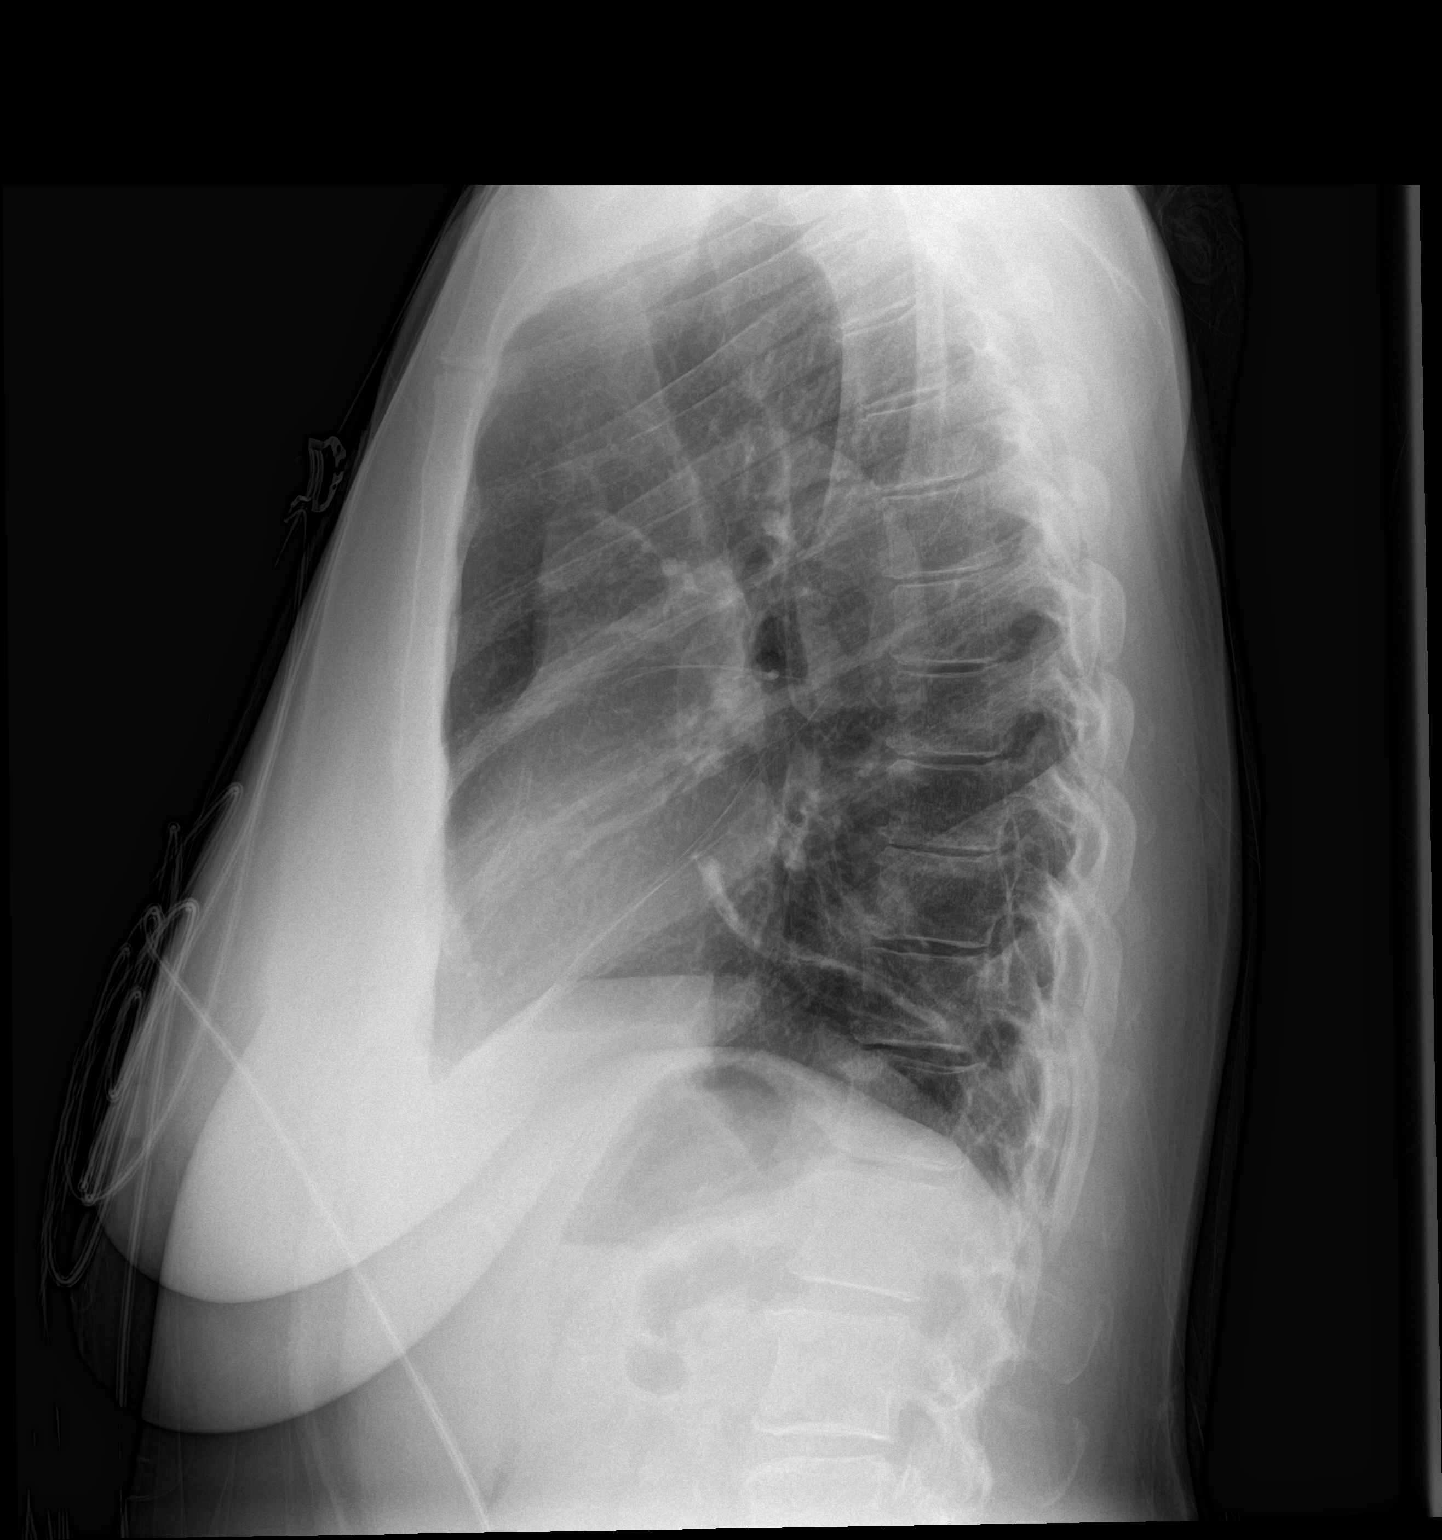

[2 of 2 positions shown; findings below may reference images not displayed]

FINDINGS: The cardiomediastinal silhouette is unremarkable. A coronary stent
is noted.

Subsegmental atelectasis/scarring in the left lower lobe noted.

There is no evidence of focal airspace disease, pulmonary edema,
suspicious pulmonary nodule/mass, pleural effusion, or pneumothorax.
No acute bony abnormalities are identified.
IMPRESSION: Right lower lobe subsegmental atelectasis/scarring without other
significant abnormality.

## 2017-08-03 ENCOUNTER — Other Ambulatory Visit (HOSPITAL_COMMUNITY): Payer: Self-pay | Admitting: Family Medicine

## 2017-08-03 DIAGNOSIS — Z1231 Encounter for screening mammogram for malignant neoplasm of breast: Secondary | ICD-10-CM

## 2017-08-15 ENCOUNTER — Ambulatory Visit (HOSPITAL_COMMUNITY)
Admission: RE | Admit: 2017-08-15 | Discharge: 2017-08-15 | Disposition: A | Discharge status: Home / Self Care | Payer: BC Managed Care – PPO | Source: Ambulatory Visit | Attending: Family Medicine | Admitting: Family Medicine

## 2017-08-15 DIAGNOSIS — R928 Other abnormal and inconclusive findings on diagnostic imaging of breast: Secondary | ICD-10-CM | POA: Diagnosis not present

## 2017-08-15 DIAGNOSIS — Z1231 Encounter for screening mammogram for malignant neoplasm of breast: Secondary | ICD-10-CM | POA: Diagnosis present

## 2017-08-18 ENCOUNTER — Other Ambulatory Visit (HOSPITAL_COMMUNITY): Payer: Self-pay | Admitting: Family Medicine

## 2017-08-18 DIAGNOSIS — R921 Mammographic calcification found on diagnostic imaging of breast: Secondary | ICD-10-CM

## 2017-08-18 HISTORY — PX: BREAST BIOPSY: SHX20

## 2017-08-29 ENCOUNTER — Other Ambulatory Visit (HOSPITAL_COMMUNITY): Payer: Self-pay | Admitting: Family Medicine

## 2017-08-29 ENCOUNTER — Ambulatory Visit (HOSPITAL_COMMUNITY)
Admission: RE | Admit: 2017-08-29 | Discharge: 2017-08-29 | Disposition: A | Discharge status: Home / Self Care | Payer: Medicare Other | Source: Ambulatory Visit | Attending: Family Medicine | Admitting: Family Medicine

## 2017-08-29 DIAGNOSIS — R921 Mammographic calcification found on diagnostic imaging of breast: Secondary | ICD-10-CM

## 2017-09-01 ENCOUNTER — Other Ambulatory Visit: Payer: Self-pay | Admitting: Family Medicine

## 2017-09-01 DIAGNOSIS — R921 Mammographic calcification found on diagnostic imaging of breast: Secondary | ICD-10-CM

## 2017-09-07 ENCOUNTER — Ambulatory Visit
Admission: RE | Admit: 2017-09-07 | Discharge: 2017-09-07 | Disposition: A | Discharge status: Home / Self Care | Payer: Medicare Other | Source: Ambulatory Visit | Attending: Family Medicine | Admitting: Family Medicine

## 2017-09-07 ENCOUNTER — Other Ambulatory Visit: Payer: Self-pay | Admitting: Family Medicine

## 2017-09-07 DIAGNOSIS — R921 Mammographic calcification found on diagnostic imaging of breast: Secondary | ICD-10-CM

## 2017-09-20 ENCOUNTER — Other Ambulatory Visit: Payer: Self-pay | Admitting: Cardiology

## 2018-02-07 ENCOUNTER — Other Ambulatory Visit: Payer: Self-pay | Admitting: Cardiology

## 2018-04-03 ENCOUNTER — Other Ambulatory Visit: Payer: Self-pay | Admitting: Cardiology

## 2018-04-06 ENCOUNTER — Other Ambulatory Visit: Payer: Self-pay | Admitting: *Deleted

## 2018-04-06 ENCOUNTER — Other Ambulatory Visit: Payer: Self-pay | Admitting: Cardiology

## 2018-04-06 MED ORDER — METOPROLOL TARTRATE 25 MG PO TABS: 25.0000 mg | ORAL_TABLET | Freq: Two times a day (BID) | ORAL | 0 refills | Status: DC

## 2018-04-06 NOTE — Telephone Encounter (Signed)
Requested refill on Lopressor 25 mg   [facility] [place] [place]   Patient has upcoming appointment in July with Dr. Wyline MoodBranch.  Please call patient to confirm this RX.    [phone].  Patient is wanting to see if she can do a 90 day refill.

## 2018-04-21 ENCOUNTER — Other Ambulatory Visit: Payer: Self-pay | Admitting: Cardiology

## 2018-05-10 ENCOUNTER — Encounter: Payer: Self-pay | Admitting: Cardiology

## 2018-05-10 ENCOUNTER — Ambulatory Visit: Payer: Medicare Other | Admitting: Cardiology

## 2018-05-10 ENCOUNTER — Other Ambulatory Visit: Payer: Self-pay

## 2018-05-10 VITALS — BP 132/71 | HR 81 | Ht 60.0 in | Wt 133.0 lb

## 2018-05-10 DIAGNOSIS — I2510 Atherosclerotic heart disease of native coronary artery without angina pectoris: Secondary | ICD-10-CM

## 2018-05-10 DIAGNOSIS — I10 Essential (primary) hypertension: Secondary | ICD-10-CM | POA: Diagnosis not present

## 2018-05-10 DIAGNOSIS — E782 Mixed hyperlipidemia: Secondary | ICD-10-CM | POA: Diagnosis not present

## 2018-05-10 MED ORDER — ATORVASTATIN CALCIUM 80 MG PO TABS: ORAL_TABLET | ORAL | 3 refills | Status: DC

## 2018-05-10 NOTE — Patient Instructions (Signed)

## 2018-05-10 NOTE — Progress Notes (Signed)
Clinical Summary [name] is a 67 y.o.female seen today for follow up of the following medical problems.   1. CAD - admit 04/2015 with inferior STEMI, s/p DES x 2 to RCA. LVEF 55-60% by echo   - no specific chest pains. No SOB/DOE - compliant with meds.  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 HTN - compliant with meds  3. Hyperlipidemia - she reports most recent labs are with pcp - she is compliant with statin.       Past Medical History:  Diagnosis Date  . Hypertension   . Pre-diabetes      No Known Allergies   Current Outpatient Medications  Medication Sig Dispense Refill  . amLODipine (NORVASC) 10 MG tablet Take 1 tablet (10 mg total) by mouth daily. 90 tablet 3  . aspirin EC 81 MG tablet Take 1 tablet (81 mg total) by mouth daily. 30 tablet 3  . atorvastatin (LIPITOR) 80 MG tablet TAKE 1 TABLET BY MOUTH ONCE DAILY AT  6  PM 30 tablet 0  . cholecalciferol (VITAMIN D) 1000 UNITS tablet Take 1,000 Units by mouth daily.    . Cyanocobalamin (VITAMIN B-12 CR PO) Take 500 mcg by mouth daily.     Marland Kitchen. lisinopril (PRINIVIL,ZESTRIL) 20 MG tablet TAKE 1 TABLET BY MOUTH ONCE DAILY 90 tablet 3  . metFORMIN (GLUCOPHAGE-XR) 500 MG 24 hr tablet Take 500 mg by mouth 2 (two) times daily with a meal.     . metoprolol tartrate (LOPRESSOR) 25 MG tablet Take 0.5 tablets (12.5 mg total) by mouth 2 (two) times daily. 30 tablet 6  . metoprolol tartrate (LOPRESSOR) 25 MG tablet Take 1 tablet (25 mg total) by mouth 2 (two) times daily. 60 tablet 0  . Multiple Vitamin (MULTIVITAMIN) tablet Take 1 tablet by mouth daily.    . nitroGLYCERIN (NITROSTAT) 0.4 MG SL tablet Place 1 tablet (0.4 mg total) under the tongue every 5 (five) minutes x 3 doses as needed for chest pain. 30 tablet 0  . ranitidine (ZANTAC) 150 MG tablet Take 1 tablet (150 mg total) by mouth 2 (two) times daily. (Patient taking differently: Take 150 mg by mouth as needed. )     No current facility-administered  medications for this visit.      Past Surgical History:  Procedure Laterality Date  . CARDIAC CATHETERIZATION N/A 04/26/2015   Procedure: Left Heart Cath and Coronary Angiography;  Surgeon: Runell GessJonathan J Berry, MD;  [facility];  Service: Cardiovascular;  Laterality: N/A;     No Known Allergies    Family History  Problem Relation Age of Onset  . Colon cancer Mother   . Lung cancer Father   . Stroke Father   . Pancreatic cancer Brother   . Heart disease Paternal Grandmother   . Stroke Paternal Grandmother   . Heart disease Paternal Grandfather   . Stroke Paternal Grandfather      Social History [name] reports that she quit smoking about 3 years ago. Her smoking use included cigarettes. She started smoking about 30 years ago. She has a 5.50 pack-year smoking history. She has never used smokeless tobacco. [name] reports that she does not drink alcohol.   Review of Systems CONSTITUTIONAL: No weight loss, fever, chills, weakness or fatigue.  HEENT: Eyes: No visual loss, blurred vision, double vision or yellow sclerae.No hearing loss, sneezing, congestion, runny nose or sore throat.  SKIN: No rash or itching.  CARDIOVASCULAR: per hpi RESPIRATORY:  No shortness of breath, cough or sputum.  GASTROINTESTINAL: No anorexia, nausea, vomiting or diarrhea. No abdominal pain or blood.  GENITOURINARY: No burning on urination, no polyuria NEUROLOGICAL: No headache, dizziness, syncope, paralysis, ataxia, numbness or tingling in the extremities. No change in bowel or bladder control.  MUSCULOSKELETAL: No muscle, back pain, joint pain or stiffness.  LYMPHATICS: No enlarged nodes. No history of splenectomy.  PSYCHIATRIC: No history of depression or anxiety.  ENDOCRINOLOGIC: No reports of sweating, cold or heat intolerance. No polyuria or polydipsia.  Marland Kitchen   Physical Examination Vitals:   05/10/18 1003  BP: 132/71  Pulse: 81  SpO2: 98%   Vitals:   05/10/18 1003    Weight: 133 lb (60.3 kg)  Height: 5' (1.524 m)    Gen: resting comfortably, no acute distress HEENT: no scleral icterus, pupils equal round and reactive, no palptable cervical adenopathy,  CV: RRR, no m/r/g, no jvd Resp: Clear to auscultation bilaterally GI: abdomen is soft, non-tender, non-distended, normal bowel sounds, no hepatosplenomegaly MSK: extremities are warm, no edema.  Skin: warm, no rash Neuro:  no focal deficits Psych: appropriate affect   Diagnostic Studies 04/2015 Cath  Mid RCA lesion, 99% stenosed. There is a 0% residual stenosis post intervention.  Prox RCA to Mid RCA lesion, 75% stenosed. There is a 0% residual stenosis post intervention.  The left ventricular systolic function is normal.    04/2015 Echo - Left ventricle: The cavity size was normal. Systolic function was normal. The estimated ejection fraction was in the range of 55% to 60%. Wall motion was normal; there were no regional wall motion abnormalities. Doppler parameters are consistent with abnormal left ventricular relaxation (grade 1 diastolic dysfunction).  09/2015 PFTs No COPD       Assessment and Plan  1. CAD - denies any recent symptoms, continue current meds - EKG today shows SR, no ischemic changes   2. HTN - she is at goal, continue current meds  3. Hyperlipidemia -continue statin, request labs from pcp  F/u 1 year       Antoine Poche, M.D.

## 2018-05-11 ENCOUNTER — Encounter: Payer: Self-pay | Admitting: *Deleted

## 2018-05-22 ENCOUNTER — Encounter: Payer: Self-pay | Admitting: Cardiology

## 2018-06-04 ENCOUNTER — Other Ambulatory Visit: Payer: Self-pay | Admitting: Cardiology

## 2018-08-14 ENCOUNTER — Other Ambulatory Visit (HOSPITAL_COMMUNITY): Payer: Self-pay | Admitting: Family Medicine

## 2018-08-14 DIAGNOSIS — Z1231 Encounter for screening mammogram for malignant neoplasm of breast: Secondary | ICD-10-CM

## 2018-08-23 ENCOUNTER — Ambulatory Visit (HOSPITAL_COMMUNITY)
Admission: RE | Admit: 2018-08-23 | Discharge: 2018-08-23 | Disposition: A | Discharge status: Home / Self Care | Payer: Medicare Other | Source: Ambulatory Visit | Attending: Family Medicine | Admitting: Family Medicine

## 2018-08-23 DIAGNOSIS — Z1231 Encounter for screening mammogram for malignant neoplasm of breast: Secondary | ICD-10-CM | POA: Diagnosis present

## 2018-09-09 ENCOUNTER — Other Ambulatory Visit: Payer: Self-pay | Admitting: Cardiology

## 2018-12-10 ENCOUNTER — Other Ambulatory Visit: Payer: Self-pay | Admitting: Cardiology

## 2019-02-20 ENCOUNTER — Telehealth: Payer: Self-pay | Admitting: Orthopedic Surgery

## 2019-05-25 ENCOUNTER — Other Ambulatory Visit: Payer: Self-pay

## 2019-05-25 ENCOUNTER — Ambulatory Visit: Payer: Medicare Other | Admitting: Cardiology

## 2019-05-25 ENCOUNTER — Ambulatory Visit (INDEPENDENT_AMBULATORY_CARE_PROVIDER_SITE_OTHER): Payer: Medicare Other | Admitting: Cardiology

## 2019-05-25 ENCOUNTER — Encounter: Payer: Self-pay | Admitting: Cardiology

## 2019-05-25 ENCOUNTER — Telehealth: Payer: Self-pay | Admitting: Cardiology

## 2019-05-25 VITALS — BP 134/70 | HR 70 | Ht 60.0 in | Wt 138.8 lb

## 2019-05-25 DIAGNOSIS — E782 Mixed hyperlipidemia: Secondary | ICD-10-CM

## 2019-05-25 DIAGNOSIS — M79606 Pain in leg, unspecified: Secondary | ICD-10-CM

## 2019-05-25 DIAGNOSIS — I10 Essential (primary) hypertension: Secondary | ICD-10-CM

## 2019-05-25 DIAGNOSIS — M79605 Pain in left leg: Secondary | ICD-10-CM

## 2019-05-25 DIAGNOSIS — I2510 Atherosclerotic heart disease of native coronary artery without angina pectoris: Secondary | ICD-10-CM

## 2019-05-25 DIAGNOSIS — M79604 Pain in right leg: Secondary | ICD-10-CM

## 2019-05-25 NOTE — Telephone Encounter (Signed)
°  Precert needed for: ABI   Location: [facility] [place]   Date: Jun 14, 2019

## 2019-05-25 NOTE — Progress Notes (Signed)
Clinical Summary [name] is a 68 y.o.female seen today for follow up of the following medical problems.  1. CAD - admit 04/2015 with inferior STEMI, s/p DES x 2 to RCA. LVEF 55-60% by echo  - some chest pain recently - sharp pain mid to left chest, 4/10 in severity. Mostly occurs house work, bending over. Mild SOB. Lasts a just a few seconds. Occurs 1-2 times a week. Symptoms about 3 month ago. Often comes on with stress. Better with calming down. Less often over the last few weeks. - not exercising as frequently as she used to due to COVID19. - compliant with meds  2. HTN -she is compliant with meds  3. Hyperlipidemia  - upcomign labs with pcp - compliant with meds   4. Leg pains - bilateral thigh and calf pain at 1/2 block, cramping pain. Right calf pain is worst.    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   Past Medical History:  Diagnosis Date   Hypertension    Pre-diabetes      No Known Allergies   Current Outpatient Medications  Medication Sig Dispense Refill   amLODipine (NORVASC) 10 MG tablet Take 1 tablet (10 mg total) by mouth daily. 90 tablet 3   aspirin EC 81 MG tablet Take 1 tablet (81 mg total) by mouth daily. 30 tablet 3   atorvastatin (LIPITOR) 80 MG tablet TAKE 1 TABLET BY MOUTH ONCE DAILY AT  6  PM 90 tablet 3   cholecalciferol (VITAMIN D) 1000 UNITS tablet Take 1,000 Units by mouth daily.     Cyanocobalamin (VITAMIN B-12 CR PO) Take 500 mcg by mouth daily.      glipiZIDE (GLUCOTROL XL) 2.5 MG 24 hr tablet Take 2.5 mg by mouth daily.  3   lisinopril (PRINIVIL,ZESTRIL) 20 MG tablet TAKE 1 TABLET BY MOUTH ONCE DAILY 90 tablet 3   metFORMIN (GLUCOPHAGE-XR) 500 MG 24 hr tablet Take 500 mg by mouth 2 (two) times daily with a meal.      metoprolol tartrate (LOPRESSOR) 25 MG tablet TAKE 1 TABLET BY MOUTH TWICE DAILY 180 tablet 2   Multiple Vitamin (MULTIVITAMIN) tablet Take 1 tablet by mouth daily.     nitroGLYCERIN (NITROSTAT) 0.4 MG SL  tablet Place 1 tablet (0.4 mg total) under the tongue every 5 (five) minutes x 3 doses as needed for chest pain. 30 tablet 0   ranitidine (ZANTAC) 150 MG tablet Take 1 tablet (150 mg total) by mouth 2 (two) times daily. (Patient taking differently: Take 150 mg by mouth as needed. )     No current facility-administered medications for this visit.      Past Surgical History:  Procedure Laterality Date   CARDIAC CATHETERIZATION N/A 04/26/2015   Procedure: Left Heart Cath and Coronary Angiography;  Surgeon: Runell GessJonathan J Berry, MD;  [facility];  Service: Cardiovascular;  Laterality: N/A;     No Known Allergies    Family History  Problem Relation Age of Onset   Colon cancer Mother    Lung cancer Father    Stroke Father    Pancreatic cancer Brother    Heart disease Paternal Grandmother    Stroke Paternal Grandmother    Heart disease Paternal Grandfather    Stroke Paternal Grandfather      Social History [name] reports that she quit smoking about 4 years ago. Her smoking use included cigarettes. She started smoking about 31 years ago. She has a 5.50 pack-year smoking history.  She has never used smokeless tobacco. [name] reports no history of alcohol use.   Review of Systems CONSTITUTIONAL: No weight loss, fever, chills, weakness or fatigue.  HEENT: Eyes: No visual loss, blurred vision, double vision or yellow sclerae.No hearing loss, sneezing, congestion, runny nose or sore throat.  SKIN: No rash or itching.  CARDIOVASCULAR: per hpi RESPIRATORY: No shortness of breath, cough or sputum.  GASTROINTESTINAL: No anorexia, nausea, vomiting or diarrhea. No abdominal pain or blood.  GENITOURINARY: No burning on urination, no polyuria NEUROLOGICAL: No headache, dizziness, syncope, paralysis, ataxia, numbness or tingling in the extremities. No change in bowel or bladder control.  MUSCULOSKELETAL: per hpi LYMPHATICS: No enlarged nodes. No history of  splenectomy.  PSYCHIATRIC: No history of depression or anxiety.  ENDOCRINOLOGIC: No reports of sweating, cold or heat intolerance. No polyuria or polydipsia.  Marland Kitchen   Physical Examination Today's Vitals   05/25/19 1033  BP: 134/70  Pulse: 70  SpO2: 99%  Weight: 138 lb 12.8 oz (63 kg)  Height: 5' (1.524 m)   Body mass index is 27.11 kg/m.  Gen: resting comfortably, no acute distress HEENT: no scleral icterus, pupils equal round and reactive, no palptable cervical adenopathy,  CV: RRR, no m/r/g, no jvd Resp: Clear to auscultation bilaterally GI: abdomen is soft, non-tender, non-distended, normal bowel sounds, no hepatosplenomegaly MSK: extremities are warm, no edema.  Skin: warm, no rash Neuro:  no focal deficits Psych: appropriate affect   Diagnostic Studies  04/2015 Cath  Mid RCA lesion, 99% stenosed. There is a 0% residual stenosis post intervention.  Prox RCA to Mid RCA lesion, 75% stenosed. There is a 0% residual stenosis post intervention.  The left ventricular systolic function is normal.    04/2015 Echo - Left ventricle: The cavity size was normal. Systolic function was normal. The estimated ejection fraction was in the range of 55% to 60%. Wall motion was normal; there were no regional wall motion abnormalities. Doppler parameters are consistent with abnormal left ventricular relaxation (grade 1 diastolic dysfunction).  09/2015 PFTs No COPD   Assessment and Plan  1. CAD -recent symptoms not overally consistent with ischemia - EKG SR, no acute ischemic changes - continue to monitor at this time, f/u 2 months to reassess. Asked to contact us sooner if symptoms change   2. HTN -at goal, continue current meds  3. Hyperlipidemia - upcoming labs with pcp, continue statin  4. Leg pains - symptoms suggestive of claudication, we will obtain ABIs. High risk of PAD given her prior CAD   F/u 2 months   Arnoldo Lenis, M.D.,

## 2019-05-25 NOTE — Patient Instructions (Addendum)
Medication Instructions:  Continue all current medications.  Labwork: none  Testing/Procedures:  Your physician has requested that you have an ankle brachial index (ABI). During this test an ultrasound and blood pressure cuff are used to evaluate the arteries that supply the arms and legs with blood. Allow thirty minutes for this exam. There are no restrictions or special instructions.  Office will contact with results via phone or letter.    Follow-Up: 2 months   Any Other Special Instructions Will Be Listed Below (If Applicable).  If you need a refill on your cardiac medications before your next appointment, please call your pharmacy.

## 2019-05-29 ENCOUNTER — Other Ambulatory Visit: Payer: Self-pay | Admitting: Cardiology

## 2019-05-31 ENCOUNTER — Other Ambulatory Visit: Payer: Self-pay | Admitting: Cardiology

## 2019-05-31 DIAGNOSIS — M79606 Pain in leg, unspecified: Secondary | ICD-10-CM

## 2019-06-06 ENCOUNTER — Other Ambulatory Visit: Payer: Self-pay | Admitting: Cardiology

## 2019-06-06 DIAGNOSIS — I739 Peripheral vascular disease, unspecified: Secondary | ICD-10-CM

## 2019-06-14 ENCOUNTER — Telehealth: Payer: Self-pay | Admitting: *Deleted

## 2019-06-14 ENCOUNTER — Other Ambulatory Visit: Payer: Self-pay

## 2019-06-14 ENCOUNTER — Ambulatory Visit (INDEPENDENT_AMBULATORY_CARE_PROVIDER_SITE_OTHER): Payer: Medicare Other

## 2019-06-14 DIAGNOSIS — I739 Peripheral vascular disease, unspecified: Secondary | ICD-10-CM | POA: Diagnosis not present

## 2019-06-14 NOTE — Telephone Encounter (Signed)
-----   Message from Suresh A Koneswaran, MD sent at 06/14/2019 10:28 AM EDT ----- No evidence of blockages. 

## 2019-06-14 NOTE — Telephone Encounter (Signed)
LM to return call.

## 2019-06-15 NOTE — Telephone Encounter (Signed)
Patient informed. Copy sent to PCP °

## 2019-06-15 NOTE — Telephone Encounter (Signed)
-----   Message from Herminio Commons, MD sent at 06/14/2019 10:28 AM EDT ----- No evidence of blockages.

## 2019-07-20 ENCOUNTER — Other Ambulatory Visit (HOSPITAL_COMMUNITY): Payer: Self-pay | Admitting: Family Medicine

## 2019-07-20 DIAGNOSIS — Z1231 Encounter for screening mammogram for malignant neoplasm of breast: Secondary | ICD-10-CM

## 2019-08-03 ENCOUNTER — Ambulatory Visit: Payer: Medicare Other | Admitting: Cardiology

## 2019-08-23 ENCOUNTER — Ambulatory Visit: Payer: Medicare Other | Admitting: Cardiology

## 2019-08-27 ENCOUNTER — Ambulatory Visit (HOSPITAL_COMMUNITY)
Admission: RE | Admit: 2019-08-27 | Discharge: 2019-08-27 | Disposition: A | Discharge status: Home / Self Care | Payer: Medicare Other | Source: Ambulatory Visit | Attending: Family Medicine | Admitting: Family Medicine

## 2019-08-27 ENCOUNTER — Other Ambulatory Visit: Payer: Self-pay

## 2019-08-27 DIAGNOSIS — Z1231 Encounter for screening mammogram for malignant neoplasm of breast: Secondary | ICD-10-CM

## 2019-08-28 ENCOUNTER — Encounter: Payer: Self-pay | Admitting: Cardiology

## 2019-09-05 ENCOUNTER — Ambulatory Visit: Payer: Medicare Other | Admitting: Cardiology

## 2019-09-14 ENCOUNTER — Other Ambulatory Visit: Payer: Self-pay | Admitting: Cardiology

## 2019-11-02 ENCOUNTER — Ambulatory Visit: Payer: Medicare Other | Admitting: Cardiology

## 2019-11-08 ENCOUNTER — Ambulatory Visit: Payer: Medicare Other | Admitting: Cardiology

## 2019-11-30 ENCOUNTER — Other Ambulatory Visit: Payer: Self-pay

## 2019-11-30 DIAGNOSIS — I83891 Varicose veins of right lower extremities with other complications: Secondary | ICD-10-CM

## 2019-12-03 ENCOUNTER — Ambulatory Visit (HOSPITAL_COMMUNITY): Payer: Medicare PPO | Attending: Surgery

## 2019-12-07 ENCOUNTER — Encounter: Payer: Self-pay | Admitting: Cardiology

## 2019-12-07 ENCOUNTER — Other Ambulatory Visit: Payer: Self-pay | Admitting: Cardiology

## 2019-12-07 ENCOUNTER — Ambulatory Visit: Payer: Medicare PPO | Admitting: Cardiology

## 2019-12-07 ENCOUNTER — Other Ambulatory Visit: Payer: Self-pay

## 2019-12-07 ENCOUNTER — Encounter: Payer: Self-pay | Admitting: *Deleted

## 2019-12-07 VITALS — BP 144/70 | HR 74 | Ht 60.0 in | Wt 134.0 lb

## 2019-12-07 DIAGNOSIS — I2510 Atherosclerotic heart disease of native coronary artery without angina pectoris: Secondary | ICD-10-CM | POA: Diagnosis not present

## 2019-12-07 DIAGNOSIS — E782 Mixed hyperlipidemia: Secondary | ICD-10-CM | POA: Diagnosis not present

## 2019-12-07 DIAGNOSIS — I10 Essential (primary) hypertension: Secondary | ICD-10-CM

## 2019-12-07 MED ORDER — LISINOPRIL 20 MG PO TABS: 20.0000 mg | ORAL_TABLET | Freq: Every day | ORAL | 6 refills | Status: DC

## 2019-12-07 MED ORDER — METOPROLOL TARTRATE 25 MG PO TABS: 25.0000 mg | ORAL_TABLET | Freq: Two times a day (BID) | ORAL | 6 refills | Status: DC

## 2019-12-07 MED ORDER — AMLODIPINE BESYLATE 10 MG PO TABS: 10.0000 mg | ORAL_TABLET | Freq: Every day | ORAL | 6 refills | Status: DC

## 2019-12-07 MED ORDER — ATORVASTATIN CALCIUM 80 MG PO TABS: 80.0000 mg | ORAL_TABLET | Freq: Every day | ORAL | 6 refills | Status: DC

## 2019-12-07 MED ORDER — NITROGLYCERIN 0.4 MG SL SUBL: 0.4000 mg | SUBLINGUAL_TABLET | SUBLINGUAL | 3 refills | Status: AC | PRN

## 2019-12-07 NOTE — Progress Notes (Signed)
Clinical Summary Andrea Herrera is a 69 y.o.female seen today for follow up of the following medical problems.  1. CAD - admit 04/2015 with inferior STEMI, s/p DES x 2 to RCA. LVEF 55-60% by echo  - no significant chest pain - some SOB/DOE she attributes to sedentary lifestyle due to pandemic.  - compliant with meds  2. HTN -she is compliant with meds - home bp's 130s/60s   3. Hyperlipidemia - labs followed by pcp - compliant withmeds  4. Leg pains - bilateral thigh and calf pain at 1/2 block, cramping pain. Right calf pain is worst.   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  Has gotten first covid vaccine at [facility] drug.   Past Medical History:  Diagnosis Date  . Hypertension   . Pre-diabetes      No Known Allergies   Current Outpatient Medications  Medication Sig Dispense Refill  . amLODipine (NORVASC) 10 MG tablet Take 1 tablet (10 mg total) by mouth daily. 90 tablet 3  . aspirin EC 81 MG tablet Take 1 tablet (81 mg total) by mouth daily. 30 tablet 3  . atorvastatin (LIPITOR) 80 MG tablet TAKE 1 TABLET BY MOUTH ONCE DAILY AT  6  PM 90 tablet 0  . cholecalciferol (VITAMIN D) 1000 UNITS tablet Take 1,000 Units by mouth daily.    . Cyanocobalamin (VITAMIN B-12 CR PO) Take 500 mcg by mouth daily.     Marland Kitchen glipiZIDE (GLUCOTROL XL) 2.5 MG 24 hr tablet Take 2.5 mg by mouth daily.  3  . lisinopril (ZESTRIL) 20 MG tablet Take 1 tablet by mouth once daily 90 tablet 0  . metFORMIN (GLUCOPHAGE-XR) 500 MG 24 hr tablet Take 500 mg by mouth 2 (two) times daily with a meal.     . metoprolol tartrate (LOPRESSOR) 25 MG tablet TAKE 1 TABLET BY MOUTH TWICE DAILY 180 tablet 2  . Multiple Vitamin (MULTIVITAMIN) tablet Take 1 tablet by mouth daily.    . nitroGLYCERIN (NITROSTAT) 0.4 MG SL tablet Place 1 tablet (0.4 mg total) under the tongue every 5 (five) minutes x 3 doses as needed for chest pain. 30 tablet 0   No current facility-administered medications for  this visit.     Past Surgical History:  Procedure Laterality Date  . CARDIAC CATHETERIZATION N/A 04/26/2015   Procedure: Left Heart Cath and Coronary Angiography;  Surgeon: Runell Gess, MD;  [facility] CV LAB;  Service: Cardiovascular;  Laterality: N/A;     No Known Allergies    Family History  Problem Relation Age of Onset  . Colon cancer Mother   . Lung cancer Father   . Stroke Father   . Pancreatic cancer Brother   . Heart disease Paternal Grandmother   . Stroke Paternal Grandmother   . Heart disease Paternal Grandfather   . Stroke Paternal Grandfather      Social History Andrea Herrera reports that she quit smoking about 4 years ago. Her smoking use included cigarettes. She started smoking about 32 years ago. She has a 5.50 pack-year smoking history. She has never used smokeless tobacco. Andrea Herrera reports no history of alcohol use.   Review of Systems CONSTITUTIONAL: No weight loss, fever, chills, weakness or fatigue.  HEENT: Eyes: No visual loss, blurred vision, double vision or yellow sclerae.No hearing loss, sneezing, congestion, runny nose or sore throat.  SKIN: No rash or itching.  CARDIOVASCULAR: per hpi RESPIRATORY: per hpi GASTROINTESTINAL: No anorexia, nausea, vomiting  or diarrhea. No abdominal pain or blood.  GENITOURINARY: No burning on urination, no polyuria NEUROLOGICAL: No headache, dizziness, syncope, paralysis, ataxia, numbness or tingling in the extremities. No change in bowel or bladder control.  MUSCULOSKELETAL: No muscle, back pain, joint pain or stiffness.  LYMPHATICS: No enlarged nodes. No history of splenectomy.  PSYCHIATRIC: No history of depression or anxiety.  ENDOCRINOLOGIC: No reports of sweating, cold or heat intolerance. No polyuria or polydipsia.  Marland Kitchen   Physical Examination Vitals:   12/07/19 1106  BP: (!) 144/70  Pulse: 74  SpO2: 98%   Filed Weights   12/07/19 1106  Weight: 134 lb (60.8 kg)    Gen: resting  comfortably, no acute distress HEENT: no scleral icterus, pupils equal round and reactive, no palptable cervical adenopathy,  CV: RRR, no m/r/g, no jvd Resp: Clear to auscultation bilaterally GI: abdomen is soft, non-tender, non-distended, normal bowel sounds, no hepatosplenomegaly MSK: extremities are warm, no edema.  Skin: warm, no rash Neuro:  no focal deficits Psych: appropriate affect   Diagnostic Studies  04/2015 Cath  Mid RCA lesion, 99% stenosed. There is a 0% residual stenosis post intervention.  Prox RCA to Mid RCA lesion, 75% stenosed. There is a 0% residual stenosis post intervention.  The left ventricular systolic function is normal.    04/2015 Echo - Left ventricle: The cavity size was normal. Systolic function was normal. The estimated ejection fraction was in the range of 55% to 60%. Wall motion was normal; there were no regional wall motion abnormalities. Doppler parameters are consistent with abnormal left ventricular relaxation (grade 1 diastolic dysfunction).  09/2015 PFTs No COPD  05/2019 ABIs Summary:  Right: Resting right ankle-brachial index is within normal range. No  evidence of significant right lower extremity arterial disease. The right  toe-brachial index is normal.   Left: Resting left ankle-brachial index is within normal range. No  evidence of significant left lower extremity arterial disease. The left  toe-brachial index is normal.     Assessment and Plan   1. CAD -no symptoms, continue current meds  2. HTN -manual recheck 144/80. She will check at home over the next week an update [name]. If above 130s/80s would increase her lisinopril to 40mg  daily.   3. Hyperlipidemia -continue statin, requet pcp labs    F/u 6 months  Arnoldo Lenis, M.D., F.A.C.C.  Date:  12/07/2019

## 2019-12-07 NOTE — Patient Instructions (Addendum)
Your physician wants you to follow-up in: 6 MONTHS WITH DR Avala You will receive a reminder letter in the mail two months in advance. If you don't receive a letter, please call our office to schedule the follow-up appointment.  Your physician recommends that you continue on your current medications as directed. Please refer to the Current Medication list given to you today.  PLEASE KEEP BLOOD PRESSURE LOG DAILY AND CALL us NEXT WEEK WITH READINGS   Thank you for choosing [facility]!!

## 2019-12-25 ENCOUNTER — Telehealth: Payer: Self-pay | Admitting: Cardiology

## 2019-12-25 NOTE — Telephone Encounter (Signed)
Patient called stating that she continues to have issues with elevated BP - states that her chest is feeling tight.

## 2019-12-25 NOTE — Telephone Encounter (Signed)
Voicemail not set up.

## 2019-12-26 NOTE — Telephone Encounter (Signed)
Multiple attempt to contact patient with no success.

## 2020-01-24 ENCOUNTER — Other Ambulatory Visit: Payer: Self-pay | Admitting: *Deleted

## 2020-01-24 DIAGNOSIS — M25569 Pain in unspecified knee: Secondary | ICD-10-CM

## 2020-01-25 ENCOUNTER — Ambulatory Visit (HOSPITAL_COMMUNITY)
Admission: RE | Admit: 2020-01-25 | Discharge: 2020-01-25 | Disposition: A | Discharge status: Home / Self Care | Payer: Medicare PPO | Source: Ambulatory Visit | Attending: Vascular Surgery | Admitting: Vascular Surgery

## 2020-01-25 ENCOUNTER — Ambulatory Visit (INDEPENDENT_AMBULATORY_CARE_PROVIDER_SITE_OTHER): Payer: Medicare PPO | Admitting: Physician Assistant

## 2020-01-25 ENCOUNTER — Other Ambulatory Visit: Payer: Self-pay

## 2020-01-25 VITALS — BP 133/75 | HR 73 | Resp 16 | Ht 60.0 in | Wt 137.3 lb

## 2020-01-25 DIAGNOSIS — M25569 Pain in unspecified knee: Secondary | ICD-10-CM | POA: Diagnosis not present

## 2020-01-25 DIAGNOSIS — M25561 Pain in right knee: Secondary | ICD-10-CM | POA: Diagnosis not present

## 2020-01-25 NOTE — Progress Notes (Signed)
[facility]     History of Present Illness  Andrea Herrera is a 69 y.o. female who presents with chief complaint: of leg pain with ambulation.  Dr. Wyline Mood had B LE ABI's performed which reveal triphasic flow in B LE with calcification.  She states her right leg has more pain than the left with increased activity.    She states she used to walk for exercise until COVID hit and now when she walks her right knee hurt.    Past medical history includes: CAD - admit 04/2015 with inferior STEMI, s/p DES x 2 to RCA. LVEF 55-60% by echo, HTN, Hyperlipidemia, and pre diabetic.     Past Medical History:  Diagnosis Date  . Hypertension   . Pre-diabetes     Past Surgical History:  Procedure Laterality Date  . CARDIAC CATHETERIZATION N/A 04/26/2015   Procedure: Left Heart Cath and Coronary Angiography;  Surgeon: Runell Gess, MD;  Location: [facility] INVASIVE CV LAB;  Service: Cardiovascular;  Laterality: N/A;    Social History   Socioeconomic History  . Marital status: Divorced    Spouse name: Not on file  . Number of children: Not on file  . Years of education: Not on file  . Highest education level: Not on file  Occupational History  . Not on file  Tobacco Use  . Smoking status: Former Smoker    Packs/day: 0.25    Years: 22.00    Pack years: 5.50    Types: Cigarettes    Start date: 09/11/1987    Quit date: 04/21/2015    Years since quitting: 4.7  . Smokeless tobacco: Never Used  Substance and Sexual Activity  . Alcohol use: No    Alcohol/week: 0.0 standard drinks  . Drug use: No  . Sexual activity: Not on file  Other Topics Concern  . Not on file  Social History Narrative  . Not on file   Social Determinants of Health   Financial Resource Strain:   . Difficulty of Paying Living Expenses:   Food Insecurity:   . Worried About Programme researcher, broadcasting/film/video in the Last Year:   . Barista in the Last Year:   Transportation Needs:   . Automotive engineer (Medical):   Marland Kitchen Lack of Transportation (Non-Medical):   Physical Activity:   . Days of Exercise per Week:   . Minutes of Exercise per Session:   Stress:   . Feeling of Stress :   Social Connections:   . Frequency of Communication with Friends and Family:   . Frequency of Social Gatherings with Friends and Family:   . Attends Religious Services:   . Active Member of Clubs or Organizations:   . Attends Banker Meetings:   Marland Kitchen Marital Status:   Intimate Partner Violence:   . Fear of Current or Ex-Partner:   . Emotionally Abused:   Marland Kitchen Physically Abused:   . Sexually Abused:     Family History  Problem Relation Age of Onset  . Colon cancer Mother   . Lung cancer Father   . Stroke Father   . Pancreatic cancer Brother   . Heart disease Paternal Grandmother   . Stroke Paternal Grandmother   . Heart disease Paternal Grandfather   . Stroke Paternal Grandfather     Current Outpatient Medications on File Prior to Visit  Medication Sig Dispense Refill  . amLODipine (NORVASC) 10 MG tablet Take 1 tablet (10 mg  total) by mouth daily. 30 tablet 6  . aspirin EC 81 MG tablet Take 1 tablet (81 mg total) by mouth daily. 30 tablet 3  . atorvastatin (LIPITOR) 80 MG tablet Take 1 tablet (80 mg total) by mouth daily at 6 PM. 30 tablet 6  . cholecalciferol (VITAMIN D) 1000 UNITS tablet Take 1,000 Units by mouth daily.    . Cyanocobalamin (VITAMIN B-12 CR PO) Take 500 mcg by mouth daily.     Marland Kitchen glipiZIDE (GLUCOTROL XL) 2.5 MG 24 hr tablet Take 2.5 mg by mouth daily.  3  . lisinopril (ZESTRIL) 20 MG tablet Take 1 tablet by mouth once daily 90 tablet 1  . metFORMIN (GLUCOPHAGE-XR) 500 MG 24 hr tablet Take 500 mg by mouth 2 (two) times daily with a meal.     . metoprolol tartrate (LOPRESSOR) 25 MG tablet Take 1 tablet (25 mg total) by mouth 2 (two) times daily. 60 tablet 6  . Multiple Vitamin (MULTIVITAMIN) tablet Take 1 tablet by mouth daily.    . nitroGLYCERIN (NITROSTAT) 0.4  MG SL tablet Place 1 tablet (0.4 mg total) under the tongue every 5 (five) minutes x 3 doses as needed for chest pain. 25 tablet 3   No current facility-administered medications on file prior to visit.    Allergies as of 01/25/2020  . (No Known Allergies)     ROS:   General:  No weight loss, Fever, chills  HEENT: No recent headaches, no nasal bleeding, no visual changes, no sore throat  Neurologic: No dizziness, blackouts, seizures. No recent symptoms of stroke or mini- stroke. No recent episodes of slurred speech, or temporary blindness.  Cardiac: No recent episodes of chest pain/pressure, no shortness of breath at rest.  No shortness of breath with exertion.  Denies history of atrial fibrillation or irregular heartbeat  Vascular: No history of rest pain in feet.  No history of claudication.  No history of non-healing ulcer, No history of DVT   Pulmonary: No home oxygen, no productive cough, no hemoptysis,  No asthma or wheezing  Musculoskeletal:  [ ]  Arthritis, [ ]  Low back pain,  [x ] Joint pain  Hematologic:No history of hypercoagulable state.  No history of easy bleeding.  No history of anemia  Gastrointestinal: No hematochezia or melena,  No gastroesophageal reflux, + trouble swallowing  Urinary: [ ]  chronic Kidney disease, [ ]  on HD - [ ]  MWF or [ ]  TTHS, [ ]  Burning with urination, [ ]  Frequent urination, [ ]  Difficulty urinating;   Skin: No rashes  Psychological: No history of anxiety,  No history of depression  Physical Examination  Vitals:   01/25/20 1311  BP: 133/75  Pulse: 73  Resp: 16  SpO2: 100%  Weight: 137 lb 4.8 oz (62.3 kg)  Height: 5' (1.524 m)    Body mass index is 26.81 kg/m.  General:  Alert and oriented, no acute distress HEENT: Normal Neck: No bruit or JVD Pulmonary: Clear to auscultation bilaterally Cardiac: Regular Rate and Rhythm without murmur Abdomen: Soft, non-tender, non-distended, no mass, no scars Skin: No rash, right later  knee/lateral claf telangiectasias 10 cm long pencil lead thin Extremity Pulses:  2+ radial, brachial, femoral, dorsalis pedis, posterior tibial pulses bilaterally Musculoskeletal: No deformity or edema  Neurologic: Upper and lower extremity motor 5/5 and symmetric  DATA:   Venous Reflux Times  +--------------+---------+------+-----------+------------+--------+  RIGHT     Reflux NoRefluxReflux TimeDiameter cmsComments  Yes                   +--------------+---------+------+-----------+------------+--------+  CFV            yes  >1 second             +--------------+---------+------+-----------+------------+--------+  FV mid    no                         +--------------+---------+------+-----------+------------+--------+  Popliteal   no                         +--------------+---------+------+-----------+------------+--------+  GSV at [facility]  no               0.51        +--------------+---------+------+-----------+------------+--------+  GSV prox thighno               0.27        +--------------+---------+------+-----------+------------+--------+  GSV mid thigh no               0.17        +--------------+---------+------+-----------+------------+--------+  GSV dist thighno               0.23        +--------------+---------+------+-----------+------------+--------+  GSV at knee  no               0.24        +--------------+---------+------+-----------+------------+--------+  SSV Pop Fossa no               0.14        +--------------+---------+------+-----------+------------+--------+         Summary:  Right:  - No evidence of deep vein thrombosis seen in the right lower  extremity.  - No evidence of superficial venous thrombosis in the right lower  extremity.  - The common femoral vein is not competent.  - The great saphenous vein is competent.  - The small saphenous vein is competent.    Assessment: Right knee pain Telangiectasias lateral knee and calf Negative arterial stenosis and venous insuffiencey.  She is not at risk of limb loss.       Plan: She will continue to wear compression socks daily.  Restart walking program as tolerates.  Possible knee support for support and comfort.  She may need a f/u appt. With her orthopedic surgeon to re evaluate her right knee pain.    Roxy Horseman PA-C [facility] Office: [phone]  MD in clinic [place]

## 2020-02-26 NOTE — Telephone Encounter (Signed)
Error

## 2020-04-08 DIAGNOSIS — S86911A Strain of unspecified muscle(s) and tendon(s) at lower leg level, right leg, initial encounter: Secondary | ICD-10-CM | POA: Diagnosis not present

## 2020-04-08 DIAGNOSIS — M9903 Segmental and somatic dysfunction of lumbar region: Secondary | ICD-10-CM | POA: Diagnosis not present

## 2020-04-08 DIAGNOSIS — M47816 Spondylosis without myelopathy or radiculopathy, lumbar region: Secondary | ICD-10-CM | POA: Diagnosis not present

## 2020-04-10 DIAGNOSIS — M9903 Segmental and somatic dysfunction of lumbar region: Secondary | ICD-10-CM | POA: Diagnosis not present

## 2020-04-10 DIAGNOSIS — S86911A Strain of unspecified muscle(s) and tendon(s) at lower leg level, right leg, initial encounter: Secondary | ICD-10-CM | POA: Diagnosis not present

## 2020-04-10 DIAGNOSIS — M47816 Spondylosis without myelopathy or radiculopathy, lumbar region: Secondary | ICD-10-CM | POA: Diagnosis not present

## 2020-04-14 DIAGNOSIS — S86911A Strain of unspecified muscle(s) and tendon(s) at lower leg level, right leg, initial encounter: Secondary | ICD-10-CM | POA: Diagnosis not present

## 2020-04-14 DIAGNOSIS — M47816 Spondylosis without myelopathy or radiculopathy, lumbar region: Secondary | ICD-10-CM | POA: Diagnosis not present

## 2020-04-14 DIAGNOSIS — M9903 Segmental and somatic dysfunction of lumbar region: Secondary | ICD-10-CM | POA: Diagnosis not present

## 2020-04-22 DIAGNOSIS — S86911A Strain of unspecified muscle(s) and tendon(s) at lower leg level, right leg, initial encounter: Secondary | ICD-10-CM | POA: Diagnosis not present

## 2020-04-22 DIAGNOSIS — M47816 Spondylosis without myelopathy or radiculopathy, lumbar region: Secondary | ICD-10-CM | POA: Diagnosis not present

## 2020-04-22 DIAGNOSIS — M9903 Segmental and somatic dysfunction of lumbar region: Secondary | ICD-10-CM | POA: Diagnosis not present

## 2020-04-25 DIAGNOSIS — S86911A Strain of unspecified muscle(s) and tendon(s) at lower leg level, right leg, initial encounter: Secondary | ICD-10-CM | POA: Diagnosis not present

## 2020-04-25 DIAGNOSIS — M9903 Segmental and somatic dysfunction of lumbar region: Secondary | ICD-10-CM | POA: Diagnosis not present

## 2020-04-25 DIAGNOSIS — M47816 Spondylosis without myelopathy or radiculopathy, lumbar region: Secondary | ICD-10-CM | POA: Diagnosis not present

## 2020-04-29 DIAGNOSIS — M9903 Segmental and somatic dysfunction of lumbar region: Secondary | ICD-10-CM | POA: Diagnosis not present

## 2020-04-29 DIAGNOSIS — S86911A Strain of unspecified muscle(s) and tendon(s) at lower leg level, right leg, initial encounter: Secondary | ICD-10-CM | POA: Diagnosis not present

## 2020-04-29 DIAGNOSIS — M47816 Spondylosis without myelopathy or radiculopathy, lumbar region: Secondary | ICD-10-CM | POA: Diagnosis not present

## 2020-05-02 DIAGNOSIS — S86911A Strain of unspecified muscle(s) and tendon(s) at lower leg level, right leg, initial encounter: Secondary | ICD-10-CM | POA: Diagnosis not present

## 2020-05-02 DIAGNOSIS — M9903 Segmental and somatic dysfunction of lumbar region: Secondary | ICD-10-CM | POA: Diagnosis not present

## 2020-05-02 DIAGNOSIS — M47816 Spondylosis without myelopathy or radiculopathy, lumbar region: Secondary | ICD-10-CM | POA: Diagnosis not present

## 2020-05-06 DIAGNOSIS — S86911A Strain of unspecified muscle(s) and tendon(s) at lower leg level, right leg, initial encounter: Secondary | ICD-10-CM | POA: Diagnosis not present

## 2020-05-06 DIAGNOSIS — M47816 Spondylosis without myelopathy or radiculopathy, lumbar region: Secondary | ICD-10-CM | POA: Diagnosis not present

## 2020-05-06 DIAGNOSIS — M9903 Segmental and somatic dysfunction of lumbar region: Secondary | ICD-10-CM | POA: Diagnosis not present

## 2020-05-08 DIAGNOSIS — S86911A Strain of unspecified muscle(s) and tendon(s) at lower leg level, right leg, initial encounter: Secondary | ICD-10-CM | POA: Diagnosis not present

## 2020-05-08 DIAGNOSIS — M47816 Spondylosis without myelopathy or radiculopathy, lumbar region: Secondary | ICD-10-CM | POA: Diagnosis not present

## 2020-05-08 DIAGNOSIS — M9903 Segmental and somatic dysfunction of lumbar region: Secondary | ICD-10-CM | POA: Diagnosis not present

## 2020-06-10 ENCOUNTER — Ambulatory Visit: Payer: Medicare PPO | Admitting: Cardiology

## 2020-06-24 ENCOUNTER — Ambulatory Visit: Payer: Medicare PPO | Admitting: Cardiology

## 2020-06-24 ENCOUNTER — Encounter: Payer: Self-pay | Admitting: Cardiology

## 2020-06-24 VITALS — BP 136/72 | HR 73 | Ht 60.0 in | Wt 136.4 lb

## 2020-06-24 DIAGNOSIS — I10 Essential (primary) hypertension: Secondary | ICD-10-CM

## 2020-06-24 DIAGNOSIS — I2510 Atherosclerotic heart disease of native coronary artery without angina pectoris: Secondary | ICD-10-CM

## 2020-06-24 DIAGNOSIS — R0602 Shortness of breath: Secondary | ICD-10-CM | POA: Diagnosis not present

## 2020-06-24 NOTE — Progress Notes (Signed)
Clinical Summary Ms. Koci is a 69 y.o.female seen today for follow up of the following medical problems.  1. CAD - admit 04/2015 with inferior STEMI, s/p DES x 2 to RCA. LVEF 55-60% by echo   - some recent SOB/DOE - started 1 month ago. Occurs with activity or stress - DOE walking, for example walking in from store parking lot - very trivial LE edema. No cough, no wheezing - some chest tightness but with [place]lanta. Often occurs with meals.   2. HTN -compliant with meds   3. Hyperlipidemia - compliant with statin  4. Leg pains - bilateral thigh and calf pain at 1/2 block, cramping pain. Right calf pain is worst.   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    Past Medical History:  Diagnosis Date  . Hypertension   . Pre-diabetes      No Known Allergies   Current Outpatient Medications  Medication Sig Dispense Refill  . amLODipine (NORVASC) 10 MG tablet Take 1 tablet (10 mg total) by mouth daily. 30 tablet 6  . aspirin EC 81 MG tablet Take 1 tablet (81 mg total) by mouth daily. 30 tablet 3  . atorvastatin (LIPITOR) 80 MG tablet Take 1 tablet (80 mg total) by mouth daily at 6 PM. 30 tablet 6  . cholecalciferol (VITAMIN D) 1000 UNITS tablet Take 1,000 Units by mouth daily.    . Cyanocobalamin (VITAMIN B-12 CR PO) Take 500 mcg by mouth daily.     Marland Kitchen glipiZIDE (GLUCOTROL XL) 2.5 MG 24 hr tablet Take 2.5 mg by mouth daily.  3  . lisinopril (ZESTRIL) 20 MG tablet Take 1 tablet by mouth once daily 90 tablet 1  . metFORMIN (GLUCOPHAGE-XR) 500 MG 24 hr tablet Take 500 mg by mouth 2 (two) times daily with a meal.     . metoprolol tartrate (LOPRESSOR) 25 MG tablet Take 1 tablet (25 mg total) by mouth 2 (two) times daily. 60 tablet 6  . Multiple Vitamin (MULTIVITAMIN) tablet Take 1 tablet by mouth daily.    . nitroGLYCERIN (NITROSTAT) 0.4 MG SL tablet Place 1 tablet (0.4 mg total) under the tongue every 5 (five)  minutes x 3 doses as needed for chest pain. 25 tablet 3   No current facility-administered medications for this visit.     Past Surgical History:  Procedure Laterality Date  . CARDIAC CATHETERIZATION N/A 04/26/2015   Procedure: Left Heart Cath and Coronary Angiography;  Surgeon: Runell Gess, MD;  Location: [facility] INVASIVE CV LAB;  Service: Cardiovascular;  Laterality: N/A;     No Known Allergies    Family History  Problem Relation Age of Onset  . Colon cancer Mother   . Lung cancer Father   . Stroke Father   . Pancreatic cancer Brother   . Heart disease Paternal Grandmother   . Stroke Paternal Grandmother   . Heart disease Paternal Grandfather   . Stroke Paternal Grandfather      Social History Ms. Hagemeister reports that she quit smoking about 5 years ago. Her smoking use included cigarettes. She started smoking about 32 years ago. She has a 5.50 pack-year smoking history. She has never used smokeless tobacco. Ms. Kruk reports no history of alcohol use.   Review of Systems CONSTITUTIONAL: No weight loss, fever, chills, weakness or fatigue.  HEENT: Eyes: No visual loss, blurred vision, double vision or yellow sclerae.No hearing loss, sneezing, congestion, runny nose or sore  throat.  SKIN: No rash or itching.  CARDIOVASCULAR: per hpi RESPIRATORY: per hpi GASTROINTESTINAL: No anorexia, nausea, vomiting or diarrhea. No abdominal pain or blood.  GENITOURINARY: No burning on urination, no polyuria NEUROLOGICAL: No headache, dizziness, syncope, paralysis, ataxia, numbness or tingling in the extremities. No change in bowel or bladder control.  MUSCULOSKELETAL: No muscle, back pain, joint pain or stiffness.  LYMPHATICS: No enlarged nodes. No history of splenectomy.  PSYCHIATRIC: No history of depression or anxiety.  ENDOCRINOLOGIC: No reports of sweating, cold or heat intolerance. No polyuria or polydipsia.  Marland Kitchen   Physical Examination Today's Vitals   06/24/20 0850  BP: 136/72    Pulse: 73  SpO2: 95%  Weight: 136 lb 6.4 oz (61.9 kg)  Height: 5' (1.524 m)   Body mass index is 26.64 kg/m.  Gen: resting comfortably, no acute distress HEENT: no scleral icterus, pupils equal round and reactive, no palptable cervical adenopathy,  CV: RRR, no m/r/g, no jvd Resp: Clear to auscultation bilaterally GI: abdomen is soft, non-tender, non-distended, normal bowel sounds, no hepatosplenomegaly MSK: extremities are warm, trace bilateral edema Skin: warm, no rash Neuro:  no focal deficits Psych: appropriate affect   Diagnostic Studies 04/2015 Cath  Mid RCA lesion, 99% stenosed. There is a 0% residual stenosis post intervention.  Prox RCA to Mid RCA lesion, 75% stenosed. There is a 0% residual stenosis post intervention.  The left ventricular systolic function is normal.    04/2015 Echo - Left ventricle: The cavity size was normal. Systolic function was normal. The estimated ejection fraction was in the range of 55% to 60%. Wall motion was normal; there were no regional wall motion abnormalities. Doppler parameters are consistent with abnormal left ventricular relaxation (grade 1 diastolic dysfunction).  09/2015 PFTs No COPD  05/2019 ABIs Summary:  Right: Resting right ankle-brachial index is within normal range. No  evidence of significant right lower extremity arterial disease. The right  toe-brachial index is normal.   Left: Resting left ankle-brachial index is within normal range. No  evidence of significant left lower extremity arterial disease. The left  toe-brachial index is normal.     Assessment and Plan  1. CAD - recent SOB/DOE. Has had some chest pains though atypical and better with antacid - due to DOE will first obtain echo, pending results likely pursue lexiscan - trace bilateral edema, may consider trial of diuretic pending initial workup - EKG today NSR, no ischemic changes  2. HTN -reasonable control, continue current  meds        Antoine Poche, M.D.

## 2020-06-24 NOTE — Patient Instructions (Signed)
Your physician recommends that you schedule a follow-up appointment in: 2 MONTHS WITH DR BRANCH  Your physician recommends that you continue on your current medications as directed. Please refer to the Current Medication list given to you today.  Your physician has requested that you have an echocardiogram. Echocardiography is a painless test that uses sound waves to create images of your heart. It provides your doctor with information about the size and shape of your heart and how well your heart's chambers and valves are working. This procedure takes approximately one hour. There are no restrictions for this procedure.  Thank you for choosing [facility]!!     

## 2020-07-01 DIAGNOSIS — M9903 Segmental and somatic dysfunction of lumbar region: Secondary | ICD-10-CM | POA: Diagnosis not present

## 2020-07-01 DIAGNOSIS — S86911A Strain of unspecified muscle(s) and tendon(s) at lower leg level, right leg, initial encounter: Secondary | ICD-10-CM | POA: Diagnosis not present

## 2020-07-01 DIAGNOSIS — M47816 Spondylosis without myelopathy or radiculopathy, lumbar region: Secondary | ICD-10-CM | POA: Diagnosis not present

## 2020-07-05 ENCOUNTER — Other Ambulatory Visit: Payer: Self-pay | Admitting: Cardiology

## 2020-07-08 DIAGNOSIS — M47816 Spondylosis without myelopathy or radiculopathy, lumbar region: Secondary | ICD-10-CM | POA: Diagnosis not present

## 2020-07-08 DIAGNOSIS — M9903 Segmental and somatic dysfunction of lumbar region: Secondary | ICD-10-CM | POA: Diagnosis not present

## 2020-07-08 DIAGNOSIS — S86911A Strain of unspecified muscle(s) and tendon(s) at lower leg level, right leg, initial encounter: Secondary | ICD-10-CM | POA: Diagnosis not present

## 2020-07-10 ENCOUNTER — Ambulatory Visit (INDEPENDENT_AMBULATORY_CARE_PROVIDER_SITE_OTHER): Payer: Medicare PPO

## 2020-07-10 DIAGNOSIS — R0602 Shortness of breath: Secondary | ICD-10-CM

## 2020-07-10 LAB — ECHOCARDIOGRAM COMPLETE
Area-P 1/2: 2.71 cm2
Calc EF: 65.8 %
MV M vel: 3.62 m/s
MV Peak grad: 52.5 mmHg
S' Lateral: 2.4 cm
Single Plane A2C EF: 68.2 %
Single Plane A4C EF: 64.1 %

## 2020-07-15 DIAGNOSIS — M47816 Spondylosis without myelopathy or radiculopathy, lumbar region: Secondary | ICD-10-CM | POA: Diagnosis not present

## 2020-07-15 DIAGNOSIS — M9903 Segmental and somatic dysfunction of lumbar region: Secondary | ICD-10-CM | POA: Diagnosis not present

## 2020-07-15 DIAGNOSIS — S86911A Strain of unspecified muscle(s) and tendon(s) at lower leg level, right leg, initial encounter: Secondary | ICD-10-CM | POA: Diagnosis not present

## 2020-07-22 ENCOUNTER — Other Ambulatory Visit (HOSPITAL_COMMUNITY): Payer: Self-pay | Admitting: Family Medicine

## 2020-07-22 DIAGNOSIS — S86911A Strain of unspecified muscle(s) and tendon(s) at lower leg level, right leg, initial encounter: Secondary | ICD-10-CM | POA: Diagnosis not present

## 2020-07-22 DIAGNOSIS — Z1231 Encounter for screening mammogram for malignant neoplasm of breast: Secondary | ICD-10-CM

## 2020-07-22 DIAGNOSIS — M9903 Segmental and somatic dysfunction of lumbar region: Secondary | ICD-10-CM | POA: Diagnosis not present

## 2020-07-22 DIAGNOSIS — M47816 Spondylosis without myelopathy or radiculopathy, lumbar region: Secondary | ICD-10-CM | POA: Diagnosis not present

## 2020-08-01 DIAGNOSIS — M47816 Spondylosis without myelopathy or radiculopathy, lumbar region: Secondary | ICD-10-CM | POA: Diagnosis not present

## 2020-08-01 DIAGNOSIS — S86911A Strain of unspecified muscle(s) and tendon(s) at lower leg level, right leg, initial encounter: Secondary | ICD-10-CM | POA: Diagnosis not present

## 2020-08-01 DIAGNOSIS — M9903 Segmental and somatic dysfunction of lumbar region: Secondary | ICD-10-CM | POA: Diagnosis not present

## 2020-08-08 DIAGNOSIS — M9903 Segmental and somatic dysfunction of lumbar region: Secondary | ICD-10-CM | POA: Diagnosis not present

## 2020-08-08 DIAGNOSIS — M47816 Spondylosis without myelopathy or radiculopathy, lumbar region: Secondary | ICD-10-CM | POA: Diagnosis not present

## 2020-08-08 DIAGNOSIS — S86911A Strain of unspecified muscle(s) and tendon(s) at lower leg level, right leg, initial encounter: Secondary | ICD-10-CM | POA: Diagnosis not present

## 2020-08-26 ENCOUNTER — Encounter: Payer: Self-pay | Admitting: Cardiology

## 2020-08-26 ENCOUNTER — Ambulatory Visit: Payer: Medicare PPO | Admitting: Cardiology

## 2020-08-26 VITALS — BP 142/80 | HR 74 | Ht 60.0 in | Wt 136.4 lb

## 2020-08-26 DIAGNOSIS — I10 Essential (primary) hypertension: Secondary | ICD-10-CM

## 2020-08-26 DIAGNOSIS — I2510 Atherosclerotic heart disease of native coronary artery without angina pectoris: Secondary | ICD-10-CM | POA: Diagnosis not present

## 2020-08-26 NOTE — Progress Notes (Signed)
Clinical Summary Andrea Herrera is a 69 y.o.female  seen today for follow up of the following medical problems.This is a focused visit on history of CAD and recent SOB.   1. CAD - admit 04/2015 with inferior STEMI, s/p DES x 2 to RCA. LVEF 55-60% by echo  - last visit reported some SOB 06/2020 echo LVEF 60-65%, indet DDx, normal RV function - breathing has improved since last visit - infrequent chest discomfort, better with mylanta. Not interested in daily antacid.     2. HTN -just took meds prior coming in      [facility]: works home health care. Has completed covid vaccine.    Past Medical History:  Diagnosis Date  . Hypertension   . Pre-diabetes      No Known Allergies   Current Outpatient Medications  Medication Sig Dispense Refill  . amLODipine (NORVASC) 10 MG tablet Take 1 tablet by mouth once daily 90 tablet 1  . aspirin EC 81 MG tablet Take 1 tablet (81 mg total) by mouth daily. 30 tablet 3  . atorvastatin (LIPITOR) 80 MG tablet Take 1 tablet (80 mg total) by mouth daily at 6 PM. 30 tablet 6  . cholecalciferol (VITAMIN D) 1000 UNITS tablet Take 1,000 Units by mouth daily.    . Cyanocobalamin (VITAMIN B-12 CR PO) Take 500 mcg by mouth daily.     Marland Kitchen glipiZIDE (GLUCOTROL XL) 2.5 MG 24 hr tablet Take 2.5 mg by mouth daily.  3  . lisinopril (ZESTRIL) 20 MG tablet Take 1 tablet by mouth once daily 90 tablet 1  . metFORMIN (GLUCOPHAGE-XR) 500 MG 24 hr tablet Take 500 mg by mouth 2 (two) times daily with a meal.     . metoprolol tartrate (LOPRESSOR) 25 MG tablet Take 1 tablet (25 mg total) by mouth 2 (two) times daily. 60 tablet 6  . Multiple Vitamin (MULTIVITAMIN) tablet Take 1 tablet by mouth daily.    . nitroGLYCERIN (NITROSTAT) 0.4 MG SL tablet Place 1 tablet (0.4 mg total) under the tongue every 5 (five) minutes x 3 doses as needed for chest pain. 25 tablet 3   No current facility-administered medications for this visit.     Past Surgical History:  Procedure  Laterality Date  . CARDIAC CATHETERIZATION N/A 04/26/2015   Procedure: Left Heart Cath and Coronary Angiography;  Surgeon: Runell Gess, MD;  Location: [facility] INVASIVE CV LAB;  Service: Cardiovascular;  Laterality: N/A;     No Known Allergies    Family History  Problem Relation Age of Onset  . Colon cancer Mother   . Lung cancer Father   . Stroke Father   . Pancreatic cancer Brother   . Heart disease Paternal Grandmother   . Stroke Paternal Grandmother   . Heart disease Paternal Grandfather   . Stroke Paternal Grandfather      Social History Ms. Yarberry reports that she quit smoking about 5 years ago. Her smoking use included cigarettes. She started smoking about 32 years ago. She has a 5.50 pack-year smoking history. She has never used smokeless tobacco. Ms. Ohlinger reports no history of alcohol use.   Review of Systems CONSTITUTIONAL: No weight loss, fever, chills, weakness or fatigue.  HEENT: Eyes: No visual loss, blurred vision, double vision or yellow sclerae.No hearing loss, sneezing, congestion, runny nose or sore throat.  SKIN: No rash or itching.  CARDIOVASCULAR: per hpi RESPIRATORY: No shortness of breath, cough or sputum.  GASTROINTESTINAL: No anorexia, nausea, vomiting or diarrhea. No  abdominal pain or blood.  GENITOURINARY: No burning on urination, no polyuria NEUROLOGICAL: No headache, dizziness, syncope, paralysis, ataxia, numbness or tingling in the extremities. No change in bowel or bladder control.  MUSCULOSKELETAL: No muscle, back pain, joint pain or stiffness.  LYMPHATICS: No enlarged nodes. No history of splenectomy.  PSYCHIATRIC: No history of depression or anxiety.  ENDOCRINOLOGIC: No reports of sweating, cold or heat intolerance. No polyuria or polydipsia.  Marland Kitchen   Physical Examination Today's Vitals   08/26/20 0840  BP: (!) 142/80  Pulse: 74  SpO2: 98%  Weight: 136 lb 6.4 oz (61.9 kg)  Height: 5' (1.524 m)   Body mass index is 26.64 kg/m.  Gen:  resting comfortably, no acute distress HEENT: no scleral icterus, pupils equal round and reactive, no palptable cervical adenopathy,  CV: RRR, no m/r/g, no jvd Resp: Clear to auscultation bilaterally GI: abdomen is soft, non-tender, non-distended, normal bowel sounds, no hepatosplenomegaly MSK: extremities are warm, no edema.  Skin: warm, no rash Neuro:  no focal deficits Psych: appropriate affect   Diagnostic Studies  04/2015 Cath  Mid RCA lesion, 99% stenosed. There is a 0% residual stenosis post intervention.  Prox RCA to Mid RCA lesion, 75% stenosed. There is a 0% residual stenosis post intervention.  The left ventricular systolic function is normal.    04/2015 Echo - Left ventricle: The cavity size was normal. Systolic function was normal. The estimated ejection fraction was in the range of 55% to 60%. Wall motion was normal; there were no regional wall motion abnormalities. Doppler parameters are consistent with abnormal left ventricular relaxation (grade 1 diastolic dysfunction).  09/2015 PFTs No COPD  05/2019 ABIs Summary:  Right: Resting right ankle-brachial index is within normal range. No  evidence of significant right lower extremity arterial disease. The right  toe-brachial index is normal.   Left: Resting left ankle-brachial index is within normal range. No  evidence of significant left lower extremity arterial disease. The left  toe-brachial index is normal.   06/2020 echo IMPRESSIONS    1. Left ventricular ejection fraction, by estimation, is 60 to 65%. The  left ventricle has normal function. The left ventricle has no regional  wall motion abnormalities. Left ventricular diastolic parameters are  indeterminate.  2. Right ventricular systolic function is normal. The right ventricular  size is normal. There is normal pulmonary artery systolic pressure. The  estimated right ventricular systolic pressure is 23.4 mmHg.  3. The mitral  valve is grossly normal. Trivial mitral valve  regurgitation.  4. The aortic valve is tricuspid. Aortic valve regurgitation is not  visualized. Mild aortic valve sclerosis is present, with no evidence of  aortic valve stenosis.  5. The inferior vena cava is normal in size with greater than 50%  respiratory variability, suggesting right atrial pressure of 3 mmHg.   Assessment and Plan  1. CAD - prior SOB/DOE has improved - recent benign echo - no exertional chest pains - monitor at this time, if recurrent/progressive SOB/DOE would plan for stress test  2. HTN - elevated today but just took meds, at last visit was essentialyl at goal just 2 months ago - monitor at this time.   F/u 6 months  Antoine Poche, M.D.

## 2020-08-26 NOTE — Patient Instructions (Signed)

## 2020-08-28 DIAGNOSIS — E1165 Type 2 diabetes mellitus with hyperglycemia: Secondary | ICD-10-CM | POA: Diagnosis not present

## 2020-08-28 DIAGNOSIS — K219 Gastro-esophageal reflux disease without esophagitis: Secondary | ICD-10-CM | POA: Diagnosis not present

## 2020-08-28 DIAGNOSIS — R5382 Chronic fatigue, unspecified: Secondary | ICD-10-CM | POA: Diagnosis not present

## 2020-08-28 DIAGNOSIS — I10 Essential (primary) hypertension: Secondary | ICD-10-CM | POA: Diagnosis not present

## 2020-08-29 ENCOUNTER — Other Ambulatory Visit: Payer: Self-pay

## 2020-08-29 ENCOUNTER — Ambulatory Visit (HOSPITAL_COMMUNITY)
Admission: RE | Admit: 2020-08-29 | Discharge: 2020-08-29 | Disposition: A | Discharge status: Home / Self Care | Payer: Medicare PPO | Source: Ambulatory Visit | Attending: Family Medicine | Admitting: Family Medicine

## 2020-08-29 DIAGNOSIS — Z1231 Encounter for screening mammogram for malignant neoplasm of breast: Secondary | ICD-10-CM | POA: Insufficient documentation

## 2020-09-02 DIAGNOSIS — E1165 Type 2 diabetes mellitus with hyperglycemia: Secondary | ICD-10-CM | POA: Diagnosis not present

## 2020-09-02 DIAGNOSIS — F419 Anxiety disorder, unspecified: Secondary | ICD-10-CM | POA: Diagnosis not present

## 2020-09-02 DIAGNOSIS — Z0000 Encounter for general adult medical examination without abnormal findings: Secondary | ICD-10-CM | POA: Diagnosis not present

## 2020-09-02 DIAGNOSIS — I10 Essential (primary) hypertension: Secondary | ICD-10-CM | POA: Diagnosis not present

## 2020-09-02 DIAGNOSIS — Z6827 Body mass index (BMI) 27.0-27.9, adult: Secondary | ICD-10-CM | POA: Diagnosis not present

## 2020-09-02 DIAGNOSIS — Z23 Encounter for immunization: Secondary | ICD-10-CM | POA: Diagnosis not present

## 2020-09-25 ENCOUNTER — Encounter: Payer: Self-pay | Admitting: Orthopaedic Surgery

## 2020-09-25 ENCOUNTER — Other Ambulatory Visit: Payer: Self-pay

## 2020-09-25 ENCOUNTER — Ambulatory Visit (INDEPENDENT_AMBULATORY_CARE_PROVIDER_SITE_OTHER): Payer: Medicare PPO | Admitting: Orthopaedic Surgery

## 2020-09-25 ENCOUNTER — Ambulatory Visit: Payer: Medicare PPO | Admitting: Orthopaedic Surgery

## 2020-09-25 DIAGNOSIS — M67439 Ganglion, unspecified wrist: Secondary | ICD-10-CM

## 2020-09-25 NOTE — Progress Notes (Signed)
Office Visit Note   Patient: Andrea Herrera           Date of Birth: [birth year]           MRN: [id number] Visit Date: 09/25/2020              Requested by: Lawerance Sabal, [place] [address] [place],  [place] [address] PCP: Lawerance Sabal, [place]   Assessment & Plan: Visit Diagnoses:  1. Dorsal wrist ganglion     Plan: Patient has small dorsal wrist ganglion adjacent to the EPL may come off the tendon or may be off the dorsal capsule.  That enlarges become painful she can return we discussed recurrence after aspiration to 50% and after excision of 10% recurrence rates.  She can get a small splint at [facility] she can use.  Follow-Up Instructions: Return if symptoms worsen or fail to improve.   Orders:  No orders of the defined types were placed in this encounter.  No orders of the defined types were placed in this encounter.     Procedures: No procedures performed   Clinical Data: No additional findings.   Subjective: Chief Complaint  Patient presents with  . Left Wrist - Pain    HPI 69 year old female seen with ganglion cyst left wrist small adjacent to Lister's tubercle.  With been present for years she thinks it may have grown slightly.  She noticed some loss of grip strength.  Hands do not wake her up at night she does not have to shake them.  Occasionally she knows she drops objects.  She denies associated neck pain.  She has been seen here by [facility] for evaluation.  She denies gait disturbance no myelopathic symptoms.  She does have problems with diabetes hypertension previous MI chronic tobacco use and coronary artery disease.  Patient also has some discomfort with right shoulder pain with outstretch and overhead activity. Review of Systems all other systems are negative is obtained HPI.   Objective: Vital Signs: BP 140/72   Pulse 60   Ht 4\' 11"  (1.499 m)   Wt 136 lb (61.7 kg)   BMI 27.47 kg/m   Physical Exam Constitutional:      Appearance: She  is well-developed.  HENT:     Head: Normocephalic.     Right Ear: External ear normal.     Left Ear: External ear normal.  Eyes:     Pupils: Pupils are equal, round, and reactive to light.  Neck:     Thyroid: No thyromegaly.     Trachea: No tracheal deviation.  Cardiovascular:     Rate and Rhythm: Normal rate.  Pulmonary:     Effort: Pulmonary effort is normal.  Abdominal:     Palpations: Abdomen is soft.  Skin:    General: Skin is warm and dry.  Neurological:     Mental Status: She is alert and oriented to person, place, and time.  Psychiatric:        Mood and Affect: Mood and affect normal.        Behavior: Behavior normal.     Ortho Exam patient is tiny ganglion cyst adjacent to Lister's tubercle next to the EPL.  May be 2 mm in size.  No tenderness over the snuffbox.  Scaphoid tuberosity is normal.  No thenar weakness.  Carpal canal is normal negative Phalen's test.  Good cervical range of motion no brachial plexus tenderness.  Specialty Comments:  No specialty comments available.  Imaging: No  results found.   PMFS History: Patient Active Problem List   Diagnosis Date Noted  . Dorsal wrist ganglion 09/30/2020  . Essential hypertension 06/04/2015  . CAD (coronary artery disease) 06/04/2015  . Palpitations 06/04/2015  . Diabetes mellitus (HCC) 04/28/2015  . Dyslipidemia 04/28/2015  . Tobacco abuse 04/28/2015  . Subsequent ST elevation (STEMI) myocardial infarction of inferior wall (HCC) 04/26/2015  . STEMI (ST elevation myocardial infarction) (HCC) 04/26/2015  . Acute ST elevation myocardial infarction (STEMI) involving other coronary artery of inferior wall (HCC) 04/26/2015   Past Medical History:  Diagnosis Date  . Hypertension   . Pre-diabetes     Family History  Problem Relation Age of Onset  . Colon cancer Mother   . Lung cancer Father   . Stroke Father   . Pancreatic cancer Brother   . Heart disease Paternal Grandmother   . Stroke Paternal  Grandmother   . Heart disease Paternal Grandfather   . Stroke Paternal Grandfather     Past Surgical History:  Procedure Laterality Date  . CARDIAC CATHETERIZATION N/A 04/26/2015   Procedure: Left Heart Cath and Coronary Angiography;  Surgeon: Runell Gess, MD;  Location: [facility] INVASIVE CV LAB;  Service: Cardiovascular;  Laterality: N/A;   Social History   Occupational History  . Not on file  Tobacco Use  . Smoking status: Former Smoker    Packs/day: 0.25    Years: 22.00    Pack years: 5.50    Types: Cigarettes    Start date: 09/11/1987    Quit date: 04/21/2015    Years since quitting: 5.4  . Smokeless tobacco: Never Used  Substance and Sexual Activity  . Alcohol use: No    Alcohol/week: 0.0 standard drinks  . Drug use: No  . Sexual activity: Not on file

## 2020-09-30 DIAGNOSIS — M67439 Ganglion, unspecified wrist: Secondary | ICD-10-CM | POA: Insufficient documentation

## 2020-10-15 ENCOUNTER — Ambulatory Visit (HOSPITAL_COMMUNITY): Payer: Medicare PPO

## 2020-10-29 ENCOUNTER — Other Ambulatory Visit: Payer: Self-pay

## 2020-10-29 ENCOUNTER — Ambulatory Visit (HOSPITAL_COMMUNITY)
Admission: RE | Admit: 2020-10-29 | Discharge: 2020-10-29 | Disposition: A | Discharge status: Home / Self Care | Payer: Medicare PPO | Source: Ambulatory Visit | Attending: Family Medicine | Admitting: Family Medicine

## 2020-10-29 DIAGNOSIS — Z1231 Encounter for screening mammogram for malignant neoplasm of breast: Secondary | ICD-10-CM | POA: Insufficient documentation

## 2020-11-10 ENCOUNTER — Telehealth: Payer: Self-pay | Admitting: Cardiology

## 2020-11-10 NOTE — Telephone Encounter (Signed)
Patient went to Urgent Care today for a fall. Was put on medication Relafen 500mg  - wants to know if this medication is safe to take.

## 2020-11-12 NOTE — Telephone Encounter (Signed)
This medication is an NSAID, in general given her heart history try to avoid or limit this class of meds, a short course would be ok but would not be on long term. Maybe 7-10 days would be reasonable   Dominga Ferry MD

## 2020-11-14 NOTE — Telephone Encounter (Signed)
Patient notified and verbalized understanding. 

## 2020-11-27 ENCOUNTER — Encounter: Payer: Self-pay | Admitting: Orthopedic Surgery

## 2020-11-27 ENCOUNTER — Ambulatory Visit (INDEPENDENT_AMBULATORY_CARE_PROVIDER_SITE_OTHER): Payer: Medicare PPO | Admitting: Orthopedic Surgery

## 2020-11-27 ENCOUNTER — Ambulatory Visit: Payer: Medicare PPO

## 2020-11-27 ENCOUNTER — Other Ambulatory Visit: Payer: Self-pay

## 2020-11-27 VITALS — BP 156/81 | HR 65 | Ht 60.0 in | Wt 132.0 lb

## 2020-11-27 DIAGNOSIS — M25561 Pain in right knee: Secondary | ICD-10-CM

## 2020-11-27 DIAGNOSIS — G8929 Other chronic pain: Secondary | ICD-10-CM

## 2020-11-27 NOTE — Progress Notes (Signed)
NEW PROBLEM//OFFICE VISIT  Summary assessment and plan:   70 year old female did a hurdler type split hyperflexing her right knee which had no problems before that even though she had surgery about 12 years ago or so.  Still having lateral pain tenderness and lateral joint line and Gertie's tubercle  I recommend MRI to evaluate lateral joint for meniscal tear versus hyperflexion of the iliotibial band at its insertion  Patient will take Relafen for 7days okayed by Dr. Wyline Mood otherwise cannot take NSAIDs  Chief Complaint  Patient presents with  . Knee Pain    Right since fall 11/04/20 has had previous surgery on right knee     71 year old female did a hurdler's type split hyperflexing the right knee complains of lateral pain.  She did go to see urgent care they recommended Relafen but she was afraid to take it because of the warnings as she has had a cardiac event in the past.  She called her cardiologist Dr. Wyline Mood who said she can take Relafen for 7 to 10 days although she did not do that yet  She did wear a brace on the knee she has been icing the knee presents now with continued lateral pain  Also complains of fullness in the back of the knee with swelling   Review of Systems  Cardiovascular: Positive for leg swelling.  Musculoskeletal: Positive for back pain.  All other systems reviewed and are negative.    Past Medical History:  Diagnosis Date  . Hypertension   . Pre-diabetes     Past Surgical History:  Procedure Laterality Date  . CARDIAC CATHETERIZATION N/A 04/26/2015   Procedure: Left Heart Cath and Coronary Angiography;  Surgeon: Runell Gess, MD;  Location: [facility] INVASIVE CV LAB;  Service: Cardiovascular;  Laterality: N/A;    Family History  Problem Relation Age of Onset  . Colon cancer Mother   . Lung cancer Father   . Stroke Father   . Pancreatic cancer Brother   . Heart disease Paternal Grandmother   . Stroke Paternal Grandmother   . Heart disease Paternal  Grandfather   . Stroke Paternal Grandfather    Social History   Tobacco Use  . Smoking status: Former Smoker    Packs/day: 0.25    Years: 22.00    Pack years: 5.50    Types: Cigarettes    Start date: 09/11/1987    Quit date: 04/21/2015    Years since quitting: 5.6  . Smokeless tobacco: Never Used  Substance Use Topics  . Alcohol use: No    Alcohol/week: 0.0 standard drinks  . Drug use: No    No Known Allergies  Current Meds  Medication Sig  . amLODipine (NORVASC) 10 MG tablet Take 1 tablet by mouth once daily  . aspirin EC 81 MG tablet Take 1 tablet (81 mg total) by mouth daily.  Marland Kitchen atorvastatin (LIPITOR) 80 MG tablet Take 1 tablet (80 mg total) by mouth daily at 6 PM.  . cholecalciferol (VITAMIN D) 1000 UNITS tablet Take 1,000 Units by mouth daily.  . Cyanocobalamin (VITAMIN B-12 CR PO) Take 500 mcg by mouth daily.   Marland Kitchen glipiZIDE (GLUCOTROL XL) 2.5 MG 24 hr tablet Take 2.5 mg by mouth daily.  Marland Kitchen lisinopril (ZESTRIL) 20 MG tablet Take 1 tablet by mouth once daily  . metFORMIN (GLUCOPHAGE-XR) 500 MG 24 hr tablet Take 500 mg by mouth 2 (two) times daily with a meal.   . metoprolol tartrate (LOPRESSOR) 25 MG tablet Take 1 tablet (  25 mg total) by mouth 2 (two) times daily.  . Multiple Vitamin (MULTIVITAMIN) tablet Take 1 tablet by mouth daily.  . nitroGLYCERIN (NITROSTAT) 0.4 MG SL tablet Place 1 tablet (0.4 mg total) under the tongue every 5 (five) minutes x 3 doses as needed for chest pain.    BP (!) 156/81   Pulse 65   Ht 5' (1.524 m)   Wt 132 lb (59.9 kg)   BMI 25.78 kg/m   Physical Exam Constitutional:      General: She is not in acute distress.    Appearance: She is well-developed.     Comments: Well developed, well nourished Normal grooming and hygiene     Cardiovascular:     Comments: No peripheral edema Musculoskeletal:     Comments: Right knee swelling and tenderness over Gertie's tubercle the skin is intact  She has full range of motion knee feels stable.   She has tenderness along the lateral joint line with the joint effusion and pain and tenderness in the back of the knee perhaps Baker's cyst    Skin:    General: Skin is warm and dry.  Neurological:     Mental Status: She is alert and oriented to person, place, and time.     Sensory: No sensory deficit.     Coordination: Coordination normal.     Gait: Gait normal.     Deep Tendon Reflexes: Reflexes are normal and symmetric.  Psychiatric:        Mood and Affect: Mood normal.        Behavior: Behavior normal.        Thought Content: Thought content normal.        Judgment: Judgment normal.     Comments: Affect normal          MEDICAL DECISION MAKING  A.  Encounter Diagnosis  Name Primary?  . Chronic pain of right knee Yes    B. DATA ANALYSED:   IMAGING: Interpretation of images: Internal images of the knee  Please refer to full dictated report but the right knee shows valgus alignment between the tibia and femur lateral compartment degenerative chondrosis chronic medial compartment degenerative arthritis of the left knee  Patella well centered on the right    Orders: MRI right knee  Outside records reviewed: Patient was able to bring her notes from [facility] regarding her arthroscopic surgery done in 2003 with her MRI report confirming that she did have a arthroscopic partial lateral meniscectomy and lateral retinacular release she also had a chondroplasty laterally she had a large complex tear the posterior horn which was resected   C. MANAGEMENT   Relafen 7 days then continue ice and Tylenol  Follow-up after MRI  No orders of the defined types were placed in this encounter.     Fuller Canada, MD  11/27/2020 5:08 PM

## 2020-11-27 NOTE — Patient Instructions (Signed)
Ice 3 x a day   Take the relafen for 7 days

## 2020-12-03 DIAGNOSIS — E1165 Type 2 diabetes mellitus with hyperglycemia: Secondary | ICD-10-CM | POA: Diagnosis not present

## 2020-12-03 DIAGNOSIS — F419 Anxiety disorder, unspecified: Secondary | ICD-10-CM | POA: Diagnosis not present

## 2020-12-03 DIAGNOSIS — Z6826 Body mass index (BMI) 26.0-26.9, adult: Secondary | ICD-10-CM | POA: Diagnosis not present

## 2020-12-03 DIAGNOSIS — I10 Essential (primary) hypertension: Secondary | ICD-10-CM | POA: Diagnosis not present

## 2020-12-10 ENCOUNTER — Other Ambulatory Visit: Payer: Self-pay

## 2020-12-10 ENCOUNTER — Ambulatory Visit (HOSPITAL_COMMUNITY)
Admission: RE | Admit: 2020-12-10 | Discharge: 2020-12-10 | Disposition: A | Discharge status: Home / Self Care | Payer: Medicare PPO | Source: Ambulatory Visit | Attending: Orthopedic Surgery | Admitting: Orthopedic Surgery

## 2020-12-10 DIAGNOSIS — G8929 Other chronic pain: Secondary | ICD-10-CM | POA: Insufficient documentation

## 2020-12-10 DIAGNOSIS — M25561 Pain in right knee: Secondary | ICD-10-CM | POA: Insufficient documentation

## 2020-12-10 DIAGNOSIS — S82831A Other fracture of upper and lower end of right fibula, initial encounter for closed fracture: Secondary | ICD-10-CM | POA: Diagnosis not present

## 2020-12-10 DIAGNOSIS — S82401A Unspecified fracture of shaft of right fibula, initial encounter for closed fracture: Secondary | ICD-10-CM | POA: Diagnosis not present

## 2020-12-10 DIAGNOSIS — M1711 Unilateral primary osteoarthritis, right knee: Secondary | ICD-10-CM | POA: Diagnosis not present

## 2020-12-13 ENCOUNTER — Other Ambulatory Visit: Payer: Self-pay | Admitting: Cardiology

## 2020-12-15 ENCOUNTER — Ambulatory Visit: Payer: Medicare PPO | Admitting: Orthopedic Surgery

## 2020-12-15 ENCOUNTER — Encounter: Payer: Self-pay | Admitting: Orthopedic Surgery

## 2020-12-15 ENCOUNTER — Other Ambulatory Visit: Payer: Self-pay

## 2020-12-15 VITALS — BP 141/75 | HR 80 | Ht 60.0 in | Wt 132.0 lb

## 2020-12-15 DIAGNOSIS — S82101A Unspecified fracture of upper end of right tibia, initial encounter for closed fracture: Secondary | ICD-10-CM | POA: Diagnosis not present

## 2020-12-15 DIAGNOSIS — S82141A Displaced bicondylar fracture of right tibia, initial encounter for closed fracture: Secondary | ICD-10-CM

## 2020-12-15 DIAGNOSIS — S82141D Displaced bicondylar fracture of right tibia, subsequent encounter for closed fracture with routine healing: Secondary | ICD-10-CM

## 2020-12-15 DIAGNOSIS — S82831D Other fracture of upper and lower end of right fibula, subsequent encounter for closed fracture with routine healing: Secondary | ICD-10-CM

## 2020-12-15 DIAGNOSIS — S82831A Other fracture of upper and lower end of right fibula, initial encounter for closed fracture: Secondary | ICD-10-CM | POA: Diagnosis not present

## 2020-12-15 NOTE — Progress Notes (Signed)
MRI follow-up  70 year old female had a fall January 18 continue with lateral knee pain which prompted MRI of the knee. MRI shows a fibular fracture and a tibial plateau fracture minimal displacement  Patient is still symptomatic will be placed in a hinged knee brace with a playmaker  She is allowed to work in this brace  6 weeks follow-up x-ray  Patient had MRI of the right knee on December 10, 2020  My reading of the MRI is as follows the MRI shows that she has a lateral plateau fracture is minimally displaced there is also a fibular head fracture as well as degenerative changes in the lateral compartment  The MRI report summary  IMPRESSION: 1. Acute nondisplaced fracture of the fibular head and neck. 2. Findings suggestive of a subacute-to-chronic depressed fracture of the posterior aspect of the lateral tibial plateau. 3. Severe lateral compartment osteoarthritis with extensive areas of full-thickness cartilage loss and prominent subchondral cystic changes throughout the lateral tibial plateau. 4. Complete maceration of the lateral meniscus. 5. Small knee joint effusion. Trace Baker's cyst.     Electronically Signed   By: Duanne Guess D.O.   On: 12/10/2020 16:19   Encounter Diagnoses  Name Primary?  . Closed fracture of right tibial plateau with routine healing, subsequent encounter Yes  . Closed fracture of head of right fibula with routine healing, subsequent encounter    She will follow up in 6 weeks for x-ray She can work in the brace weight-bear as tolerated

## 2020-12-15 NOTE — Patient Instructions (Addendum)
To whom it may concern   Andrea Herrera sustained a fracture of her fibula and tibia right knee secondary to fall January 18. Patient will work in a hinged knee brace next 6 weeks  Encounter Diagnoses  Name Primary?  . Closed fracture of right tibial plateau with routine healing, subsequent encounter Yes  . Closed fracture of head of right fibula with routine healing, subsequent encounter

## 2021-01-16 ENCOUNTER — Other Ambulatory Visit: Payer: Self-pay | Admitting: Cardiology

## 2021-01-26 ENCOUNTER — Other Ambulatory Visit: Payer: Self-pay

## 2021-01-26 ENCOUNTER — Ambulatory Visit: Payer: Medicare PPO

## 2021-01-26 ENCOUNTER — Ambulatory Visit: Payer: Medicare PPO | Admitting: Orthopedic Surgery

## 2021-01-26 ENCOUNTER — Encounter: Payer: Self-pay | Admitting: Orthopedic Surgery

## 2021-01-26 VITALS — BP 144/76 | HR 77 | Ht 60.0 in | Wt 138.0 lb

## 2021-01-26 DIAGNOSIS — S82141D Displaced bicondylar fracture of right tibia, subsequent encounter for closed fracture with routine healing: Secondary | ICD-10-CM

## 2021-01-26 DIAGNOSIS — G8929 Other chronic pain: Secondary | ICD-10-CM | POA: Diagnosis not present

## 2021-01-26 DIAGNOSIS — M1711 Unilateral primary osteoarthritis, right knee: Secondary | ICD-10-CM

## 2021-01-26 NOTE — Progress Notes (Signed)
Follow up   Encounter Diagnoses  Name Primary?  . Closed fracture of right tibial plateau with routine healing, subsequent encounter Yes  . Chronic pain of right knee   . Primary osteoarthritis of right knee     Chief Complaint  Patient presents with  . Knee Pain    Follow up   [name] is 70 years old she was pretty much asymptomatic until her injury back in late January or early February.  We sent her for an MRI she has a compression fracture lateral tibial plateau with degenerative complex the lateral compartment  She comes in today after treatment with Tylenol knee brace with continued posterior pain behind the knee and lateral joint line pain at the knee joint  She is on aspirin for stent and cardiology has asked that we not use anti-inflammatories  She was told "home therapy which driving 45 minutes without 20 using her current looks like  Examination of the right knee shows there is no effusion but she does have tenderness over the lateral femoral condyle and lateral joint line proximal tibial plateau  Possible trace distinguish between her fracture pain in her arthritis pain although she notes that she has stiffness in the morning difficulty getting out of the chair and pain at night  Her x-ray today shows increased valgus alignment to the right joint space narrowing of the lateral compartment with a fairly normal medial compartment and mild patellofemoral peripheral osteophytes and femur on x-ray.  MRI of a month or 2 ago did show a compression fracture that should PCL fixation time  We discussed that she is in a difficult situation right now because we cannot distinguish between 2 sources of potential pain is most likely her arthritis pain that is bothering her left muscle injection right knee  She will see Dr. Wyline Mood in May they will discuss possible total knee  We discussed possible hyaluronic acid injection especially if we cannot perform the surgery which I have  advised if her pain and dysfunction continue  Coding: Chronic problem with exacerbation, cardiac history and psoriasis surgery  Cortisone injection  Procedure note right knee injection  Verbal consent was obtained timeout was taken to confirm the injection site as right knee  A lateral approach with the knee in flexion 6 mg of Celestone and 2 cc once lidocaine injected after cleaning the skin with alcohol and spraying complications.dx  Encounter Diagnoses  Name Primary?  . Closed fracture of right tibial plateau with routine healing, subsequent encounter Yes  . Chronic pain of right knee   . Primary osteoarthritis of right knee

## 2021-02-09 ENCOUNTER — Other Ambulatory Visit: Payer: Self-pay | Admitting: Cardiology

## 2021-02-24 ENCOUNTER — Ambulatory Visit: Payer: Medicare PPO | Admitting: Cardiology

## 2021-02-24 ENCOUNTER — Encounter: Payer: Self-pay | Admitting: Cardiology

## 2021-02-24 VITALS — BP 140/70 | HR 76 | Ht 60.0 in | Wt 136.4 lb

## 2021-02-24 DIAGNOSIS — I2510 Atherosclerotic heart disease of native coronary artery without angina pectoris: Secondary | ICD-10-CM | POA: Diagnosis not present

## 2021-02-24 DIAGNOSIS — Z01810 Encounter for preprocedural cardiovascular examination: Secondary | ICD-10-CM | POA: Diagnosis not present

## 2021-02-24 DIAGNOSIS — I10 Essential (primary) hypertension: Secondary | ICD-10-CM | POA: Diagnosis not present

## 2021-02-24 DIAGNOSIS — E782 Mixed hyperlipidemia: Secondary | ICD-10-CM

## 2021-02-24 MED ORDER — LISINOPRIL 40 MG PO TABS: 40.0000 mg | ORAL_TABLET | Freq: Every day | ORAL | 1 refills | Status: DC

## 2021-02-24 NOTE — Patient Instructions (Signed)
Your physician recommends that you schedule a follow-up appointment in: 6 MONTHS WITH DR [facility]  Your physician has recommended you make the following change in your medication:   INCREASE LISINOPRIL 40 MG DAILY   Your physician recommends that you return for lab work in 2 WEEKS BMP/LIPIDS - PLEASE FAST 6-8 WEEKS PRIOR TO LAB WORK  Thank you for choosing [facility]!!

## 2021-02-24 NOTE — Progress Notes (Signed)
Clinical Summary Andrea Herrera is a 70 y.o.female seen today for follow up of the following medical problems.This is a focused visit on history of CAD and recent SOB.   1. CAD - admit 04/2015 with inferior STEMI, s/p DES x 2 to RCA. LVEF 55-60% by echo  06/2020 echo LVEF 60-65%, indet DDx, normal RV function - breathing has improved since last visit - infrequent chest discomfort, better with mylanta. Not interested in daily antacid.    - no recent chest pain. SOB improving.  - compliant with meds   2. HTN Compliant with meds  3. Hyperlipidemia - labs followed by pcp - she is on statin  4. 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    Past Medical History:  Diagnosis Date  . Hypertension   . Pre-diabetes      No Known Allergies   Current Outpatient Medications  Medication Sig Dispense Refill  . amLODipine (NORVASC) 10 MG tablet Take 1 tablet by mouth once daily 90 tablet 1  . aspirin EC 81 MG tablet Take 1 tablet (81 mg total) by mouth daily. 30 tablet 3  . atorvastatin (LIPITOR) 80 MG tablet TAKE 1 TABLET BY MOUTH ONCE DAILY AT 6 PM 30 tablet 6  . cholecalciferol (VITAMIN D) 1000 UNITS tablet Take 1,000 Units by mouth daily.    . Cyanocobalamin (VITAMIN B-12 CR PO) Take 500 mcg by mouth daily.     Marland Kitchen glipiZIDE (GLUCOTROL XL) 2.5 MG 24 hr tablet Take 2.5 mg by mouth daily.  3  . lisinopril (ZESTRIL) 20 MG tablet Take 1 tablet by mouth once daily 90 tablet 3  . metFORMIN (GLUCOPHAGE-XR) 500 MG 24 hr tablet Take 500 mg by mouth 2 (two) times daily with a meal.     . metoprolol tartrate (LOPRESSOR) 25 MG tablet Take 1 tablet by mouth twice daily 180 tablet 2  . Multiple Vitamin (MULTIVITAMIN) tablet Take 1 tablet by mouth daily.    . nitroGLYCERIN (NITROSTAT) 0.4 MG SL  tablet Place 1 tablet (0.4 mg total) under the tongue every 5 (five) minutes x 3 doses as needed for chest pain. 25 tablet 3   No current facility-administered medications for this visit.     Past Surgical History:  Procedure Laterality Date  . CARDIAC CATHETERIZATION N/A 04/26/2015   Procedure: Left Heart Cath and Coronary Angiography;  Surgeon: Runell Gess, MD;  Location: [facility] INVASIVE CV LAB;  Service: Cardiovascular;  Laterality: N/A;     No Known Allergies    Family History  Problem Relation Age of Onset  . Colon cancer Mother   . Lung cancer Father   . Stroke Father   . Pancreatic cancer Brother   . Heart disease Paternal Grandmother   . Stroke Paternal Grandmother   . Heart disease Paternal Grandfather   . Stroke Paternal Grandfather      Social History Andrea Herrera reports that she quit smoking about 5 years ago. Her smoking use included cigarettes. She started smoking about 33 years ago. She has a 5.50 pack-year smoking history. She has never used smokeless tobacco. Andrea Herrera reports no history of alcohol use.   Review of Systems CONSTITUTIONAL: No weight loss, fever, chills, weakness or fatigue.  HEENT: Eyes: No visual loss, blurred vision, double vision or yellow  sclerae.No hearing loss, sneezing, congestion, runny nose or sore throat.  SKIN: No rash or itching.  CARDIOVASCULAR: per hpi RESPIRATORY: No shortness of breath, cough or sputum.  GASTROINTESTINAL: No anorexia, nausea, vomiting or diarrhea. No abdominal pain or blood.  GENITOURINARY: No burning on urination, no polyuria NEUROLOGICAL: No headache, dizziness, syncope, paralysis, ataxia, numbness or tingling in the extremities. No change in bowel or bladder control.  MUSCULOSKELETAL: No muscle, back pain, joint pain or stiffness.  LYMPHATICS: No enlarged nodes. No history of splenectomy.  PSYCHIATRIC: No history of depression or anxiety.  ENDOCRINOLOGIC: No reports of sweating, cold or heat intolerance.  No polyuria or polydipsia.  Marland Kitchen   Physical Examination Today's Vitals   02/24/21 0834  BP: 140/70  Pulse: 76  SpO2: 98%  Weight: 136 lb 6.4 oz (61.9 kg)  Height: 5' (1.524 m)   Body mass index is 26.64 kg/m.  Gen: resting comfortably, no acute distress HEENT: no scleral icterus, pupils equal round and reactive, no palptable cervical adenopathy,  CV: RRR, no m/r/g, no jvd Resp: Clear to auscultation bilaterally GI: abdomen is soft, non-tender, non-distended, normal bowel sounds, no hepatosplenomegaly MSK: extremities are warm, no edema.  Skin: warm, no rash Neuro:  no focal deficits Psych: appropriate affect   Diagnostic Studies 04/2015 Cath  Mid RCA lesion, 99% stenosed. There is a 0% residual stenosis post intervention.  Prox RCA to Mid RCA lesion, 75% stenosed. There is a 0% residual stenosis post intervention.  The left ventricular systolic function is normal.    04/2015 Echo - Left ventricle: The cavity size was normal. Systolic function was normal. The estimated ejection fraction was in the range of 55% to 60%. Wall motion was normal; there were no regional wall motion abnormalities. Doppler parameters are consistent with abnormal left ventricular relaxation (grade 1 diastolic dysfunction).  09/2015 PFTs No COPD  05/2019 ABIs Summary:  Right: Resting right ankle-brachial index is within normal range. No  evidence of significant right lower extremity arterial disease. The right  toe-brachial index is normal.   Left: Resting left ankle-brachial index is within normal range. No  evidence of significant left lower extremity arterial disease. The left  toe-brachial index is normal.   06/2020 echo IMPRESSIONS    1. Left ventricular ejection fraction, by estimation, is 60 to 65%. The  left ventricle has normal function. The left ventricle has no regional  wall motion abnormalities. Left ventricular diastolic parameters are  indeterminate.   2. Right ventricular systolic function is normal. The right ventricular  size is normal. There is normal pulmonary artery systolic pressure. The  estimated right ventricular systolic pressure is 23.4 mmHg.  3. The mitral valve is grossly normal. Trivial mitral valve  regurgitation.  4. The aortic valve is tricuspid. Aortic valve regurgitation is not  visualized. Mild aortic valve sclerosis is present, with no evidence of  aortic valve stenosis.  5. The inferior vena cava is normal in size with greater than 50%  respiratory variability, suggesting right atrial pressure of 3 mmHg.     Assessment and Plan  1. CAD - no recent symptoms, continue current meds  2. HTN -not at goal, increase lisinopril to 40mg  daily. Check bmet 2 weeks  3. Hyperlipidemia - continue statin, repeat lipid panel  4. Preoperative evaluation - considering knee replacement per patient report - history of CAD, not able to assess functional capacity as greatly limited by knee pain - if she plans for surgery in the future would recommend lexiscasn prior for  risk stratification.       Antoine Poche, M.D

## 2021-03-09 ENCOUNTER — Ambulatory Visit: Payer: Medicare PPO | Admitting: Orthopedic Surgery

## 2021-03-23 ENCOUNTER — Other Ambulatory Visit: Payer: Self-pay

## 2021-03-23 ENCOUNTER — Other Ambulatory Visit (HOSPITAL_COMMUNITY)
Admission: RE | Admit: 2021-03-23 | Discharge: 2021-03-23 | Disposition: A | Discharge status: Home / Self Care | Payer: Medicare PPO | Source: Ambulatory Visit | Attending: Cardiology | Admitting: Cardiology

## 2021-03-23 DIAGNOSIS — I2510 Atherosclerotic heart disease of native coronary artery without angina pectoris: Secondary | ICD-10-CM | POA: Diagnosis not present

## 2021-03-23 LAB — LIPID PANEL
Cholesterol: 99 mg/dL (ref 0–200)
HDL: 56 mg/dL (ref >40)
LDL Cholesterol: 31 mg/dL (ref 0–99)
Total CHOL/HDL Ratio: 1.8 RATIO
Triglycerides: 60 mg/dL (ref <150)
VLDL: 12 mg/dL (ref 0–40)

## 2021-03-23 LAB — BASIC METABOLIC PANEL
Anion gap: 9 (ref 5–15)
BUN: 7 mg/dL — ABNORMAL LOW (ref 8–23)
CO2: 27 mmol/L (ref 22–32)
Calcium: 9.6 mg/dL (ref 8.9–10.3)
Chloride: 101 mmol/L (ref 98–111)
Creatinine, Ser: 0.6 mg/dL (ref 0.44–1.00)
GFR, Estimated: >60 mL/min (ref >60)
Glucose, Bld: 310 mg/dL — ABNORMAL HIGH (ref 70–99)
Potassium: 4.3 mmol/L (ref 3.5–5.1)
Sodium: 137 mmol/L (ref 135–145)

## 2021-04-02 ENCOUNTER — Encounter: Payer: Self-pay | Admitting: *Deleted

## 2021-05-12 DIAGNOSIS — Z23 Encounter for immunization: Secondary | ICD-10-CM | POA: Diagnosis not present

## 2021-06-03 DIAGNOSIS — F419 Anxiety disorder, unspecified: Secondary | ICD-10-CM | POA: Diagnosis not present

## 2021-06-03 DIAGNOSIS — E1165 Type 2 diabetes mellitus with hyperglycemia: Secondary | ICD-10-CM | POA: Diagnosis not present

## 2021-06-03 DIAGNOSIS — Z6826 Body mass index (BMI) 26.0-26.9, adult: Secondary | ICD-10-CM | POA: Diagnosis not present

## 2021-06-03 DIAGNOSIS — I10 Essential (primary) hypertension: Secondary | ICD-10-CM | POA: Diagnosis not present

## 2021-06-03 DIAGNOSIS — F17210 Nicotine dependence, cigarettes, uncomplicated: Secondary | ICD-10-CM | POA: Diagnosis not present

## 2021-06-11 DIAGNOSIS — Z6826 Body mass index (BMI) 26.0-26.9, adult: Secondary | ICD-10-CM | POA: Diagnosis not present

## 2021-06-11 DIAGNOSIS — F17210 Nicotine dependence, cigarettes, uncomplicated: Secondary | ICD-10-CM | POA: Diagnosis not present

## 2021-06-11 DIAGNOSIS — E1165 Type 2 diabetes mellitus with hyperglycemia: Secondary | ICD-10-CM | POA: Diagnosis not present

## 2021-07-10 DIAGNOSIS — E1165 Type 2 diabetes mellitus with hyperglycemia: Secondary | ICD-10-CM | POA: Diagnosis not present

## 2021-07-10 DIAGNOSIS — Z6826 Body mass index (BMI) 26.0-26.9, adult: Secondary | ICD-10-CM | POA: Diagnosis not present

## 2021-07-10 DIAGNOSIS — F17210 Nicotine dependence, cigarettes, uncomplicated: Secondary | ICD-10-CM | POA: Diagnosis not present

## 2021-07-28 ENCOUNTER — Other Ambulatory Visit: Payer: Self-pay

## 2021-07-28 ENCOUNTER — Encounter: Payer: Medicare PPO | Attending: Family Medicine | Admitting: Nutrition

## 2021-07-28 ENCOUNTER — Encounter: Payer: Self-pay | Admitting: Nutrition

## 2021-07-28 VITALS — Ht 60.0 in | Wt 137.0 lb

## 2021-07-28 DIAGNOSIS — I10 Essential (primary) hypertension: Secondary | ICD-10-CM | POA: Diagnosis not present

## 2021-07-28 DIAGNOSIS — I252 Old myocardial infarction: Secondary | ICD-10-CM | POA: Diagnosis not present

## 2021-07-28 DIAGNOSIS — E118 Type 2 diabetes mellitus with unspecified complications: Secondary | ICD-10-CM | POA: Diagnosis not present

## 2021-07-28 NOTE — Progress Notes (Signed)
Medical Nutrition Therapy  Appointment Start time:  1530  Appointment End time:  1630  Primary concerns today: DM Type 2  Referral diagnosis: E11.8 Preferred learning style: No preference. Learning readiness: Ready   NUTRITION ASSESSMENT  FBS 140's usually.   Anthropometrics  Wt Readings from Last 3 Encounters:  07/28/21 137 lb (62.1 kg)  02/24/21 136 lb 6.4 oz (61.9 kg)  01/26/21 138 lb (62.6 kg)   Ht Readings from Last 3 Encounters:  07/28/21 5' (1.524 m)  02/24/21 5' (1.524 m)  01/26/21 5' (1.524 m)   Body mass index is 26.76 kg/m. @BMIFA @ Facility age limit for growth percentiles is 20 years. Facility age limit for growth percentiles is 20 years.    Clinical Medical Hx: HA 2015, Type 2 DM Medications: Metformin 500 mg BID, Glipizide 5 mg a day. Had been increased Linsinopril to 40 mg by cardiology   Labs:  Lab Results  Component Value Date   HGBA1C 6.7 (H) 04/27/2015   CMP Latest Ref Rng & Units 03/23/2021 04/26/2015 04/26/2015  Glucose 70 - 99 mg/dL 06/27/2015) 619(J) 093(O)  BUN 8 - 23 mg/dL 7(L) 671(I) 5(L)  Creatinine 0.44 - 1.00 mg/dL <4(P 8.09 9.83  Sodium 135 - 145 mmol/L 137 138 139  Potassium 3.5 - 5.1 mmol/L 4.3 3.7 3.9  Chloride 98 - 111 mmol/L 101 103 103  CO2 22 - 32 mmol/L 27 26 26   Calcium 8.9 - 10.3 mg/dL 9.6 9.4 9.5  Total Protein 6.5 - 8.1 g/dL - - 7.7  Total Bilirubin 0.3 - 1.2 mg/dL - - 0.4  Alkaline Phos 38 - 126 U/L - - 111  AST 15 - 41 U/L - - 41  ALT 14 - 54 U/L - - 20    Notable Signs/Symptoms: Blurry vision  Lifestyle & Dietary Hx Lives with her son. Works PT for [employer] in [place].   Estimated daily fluid intake: 40 oz Supplements: MVI, VIt D Sleep: 8/9 hrs  Stress / self-care: none Current average weekly physical activity: Cardiac rehab exercises daily.   24-Hr Dietary Recall First Meal:  3/4 c Cherrios  with 2%. 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   NUTRITION INTERVENTION  Nutrition education (E-1) on the following topics:  Nutrition and Diabetes education provided on My Plate, CHO counting, meal planning, portion sizes, timing of meals, avoiding snacks between meals unless having a low blood sugar, target ranges for A1C and blood sugars, signs/symptoms and treatment of hyper/hypoglycemia, monitoring blood sugars, taking medications as prescribed, benefits of exercising 30 minutes per day and prevention of complications of DM. Low Cholesterol, high fiber diet  Handouts Provided Include  My Plate Meal Plan Card Diabetes instructions Low Cholesterol Diet  Learning Style & Readiness for Change Teaching method utilized: Visual & Auditory  Demonstrated degree of understanding via: Teach Back  Barriers to learning/adherence to lifestyle change: none  Goals Established by Pt Goals  Follow My Plate Plant based  Eat meals on time Eat 2-3 carbs choices per meal Drink 5-6 bottles of water per day Get am BS less than 130 and  Bedtime less than 150. Test blood sugars twice a day Take medications as prescribed. Record BS on sheets and meter to all visits.   MONITORING & EVALUATION Dietary intake, weekly physical activity, and weight/blood sugars in 1 month.  Next Steps  Patient is to work on meal planning and choosing more plant based foods.Marland Kitchen

## 2021-07-28 NOTE — Patient Instructions (Signed)
Goals  Follow My Plate Plant based  Eat meals on time Eat 2-3 carbs choices per meal Drink 5-6 bottles of water per day Get am BS less than 130 and Bedtime less than 150. Test blood sugars twice a day Take medications as prescribed. Record BS on sheets and meter to all visits.

## 2021-08-17 ENCOUNTER — Encounter: Payer: Self-pay | Admitting: Nutrition

## 2021-08-27 ENCOUNTER — Ambulatory Visit: Payer: Medicare PPO | Admitting: Nutrition

## 2021-09-01 DIAGNOSIS — I10 Essential (primary) hypertension: Secondary | ICD-10-CM | POA: Diagnosis not present

## 2021-09-01 DIAGNOSIS — Z23 Encounter for immunization: Secondary | ICD-10-CM | POA: Diagnosis not present

## 2021-09-01 DIAGNOSIS — Z0000 Encounter for general adult medical examination without abnormal findings: Secondary | ICD-10-CM | POA: Diagnosis not present

## 2021-09-01 DIAGNOSIS — F17210 Nicotine dependence, cigarettes, uncomplicated: Secondary | ICD-10-CM | POA: Diagnosis not present

## 2021-09-01 DIAGNOSIS — E1165 Type 2 diabetes mellitus with hyperglycemia: Secondary | ICD-10-CM | POA: Diagnosis not present

## 2021-09-01 DIAGNOSIS — Z6826 Body mass index (BMI) 26.0-26.9, adult: Secondary | ICD-10-CM | POA: Diagnosis not present

## 2021-09-03 ENCOUNTER — Encounter: Payer: Self-pay | Admitting: Cardiology

## 2021-09-03 ENCOUNTER — Ambulatory Visit: Payer: Medicare PPO | Admitting: Cardiology

## 2021-09-03 ENCOUNTER — Other Ambulatory Visit: Payer: Self-pay

## 2021-09-03 VITALS — BP 124/80 | HR 75 | Ht 60.0 in | Wt 133.0 lb

## 2021-09-03 DIAGNOSIS — I2510 Atherosclerotic heart disease of native coronary artery without angina pectoris: Secondary | ICD-10-CM | POA: Diagnosis not present

## 2021-09-03 DIAGNOSIS — I10 Essential (primary) hypertension: Secondary | ICD-10-CM | POA: Diagnosis not present

## 2021-09-03 DIAGNOSIS — E782 Mixed hyperlipidemia: Secondary | ICD-10-CM

## 2021-09-03 MED ORDER — LISINOPRIL 2.5 MG PO TABS: 2.5000 mg | ORAL_TABLET | Freq: Every day | ORAL | 6 refills | Status: DC

## 2021-09-03 NOTE — Progress Notes (Signed)
Clinical Summary Andrea Herrera is a 70 y.o.female seen today for follow up of the following medical problems.   1. CAD - admit 04/2015 with inferior STEMI, s/p DES x 2 to RCA. LVEF 55-60% by echo   06/2020 echo LVEF 60-65%, indet DDx, normal RV function      - no chest pains.Chronic stable mild SOB - compliant with meds     2. HTN Compliant with meds - side effects on lisinopril 40mg , stopped taking.  - home bp's 110s/60s    3. Hyperlipidemia - she is on atorvastatin 80mg  daily  03/2021 TC 99 TG 60 HDL 56 LDL 31    4. Preoperative evaluation - considering knee replacement but not committed - limited by knee injury - previously was doing 20-30 min on stationary bike  at regular pace in January prior to knee injury    Past Medical History:  Diagnosis Date   Hypertension    Pre-diabetes      No Known Allergies   Current Outpatient Medications  Medication Sig Dispense Refill   amLODipine (NORVASC) 10 MG tablet Take 1 tablet by mouth once daily 90 tablet 1   aspirin EC 81 MG tablet Take 1 tablet (81 mg total) by mouth daily. 30 tablet 3   atorvastatin (LIPITOR) 80 MG tablet TAKE 1 TABLET BY MOUTH ONCE DAILY AT 6 PM 30 tablet 6   cholecalciferol (VITAMIN D) 1000 UNITS tablet Take 1,000 Units by mouth daily.     Cyanocobalamin (VITAMIN B-12 CR PO) Take 500 mcg by mouth daily.      glipiZIDE (GLUCOTROL XL) 2.5 MG 24 hr tablet Take 2.5 mg by mouth daily.  3   lisinopril (ZESTRIL) 40 MG tablet Take 1 tablet (40 mg total) by mouth daily. 90 tablet 1   metFORMIN (GLUCOPHAGE-XR) 500 MG 24 hr tablet Take 500 mg by mouth 2 (two) times daily with a meal.      metoprolol tartrate (LOPRESSOR) 25 MG tablet Take 1 tablet by mouth twice daily 180 tablet 2   Multiple Vitamin (MULTIVITAMIN) tablet Take 1 tablet by mouth daily.     nitroGLYCERIN (NITROSTAT) 0.4 MG SL tablet Place 1 tablet (0.4 mg total) under the tongue every 5 (five) minutes x 3 doses as needed for chest pain. 25 tablet  3   No current facility-administered medications for this visit.     Past Surgical History:  Procedure Laterality Date   CARDIAC CATHETERIZATION N/A 04/26/2015   Procedure: Left Heart Cath and Coronary Angiography;  Surgeon: February, MD;  Location: [facility] INVASIVE CV LAB;  Service: Cardiovascular;  Laterality: N/A;     No Known Allergies    Family History  Problem Relation Age of Onset   Colon cancer Mother    Lung cancer Father    Stroke Father    Pancreatic cancer Brother    Heart disease Paternal Grandmother    Stroke Paternal Grandmother    Heart disease Paternal Grandfather    Stroke Paternal Grandfather      Social History Ms. Bost reports that she quit smoking about 6 years ago. Her smoking use included cigarettes. She started smoking about 34 years ago. She has a 5.50 pack-year smoking history. She has never used smokeless tobacco. Ms. Lindfors reports no history of alcohol use.   Review of Systems CONSTITUTIONAL: No weight loss, fever, chills, weakness or fatigue.  HEENT: Eyes: No visual loss, blurred vision, double vision or yellow sclerae.No hearing loss, sneezing, congestion, runny nose  or sore throat.  SKIN: No rash or itching.  CARDIOVASCULAR: per hpi RESPIRATORY: No shortness of breath, cough or sputum.  GASTROINTESTINAL: No anorexia, nausea, vomiting or diarrhea. No abdominal pain or blood.  GENITOURINARY: No burning on urination, no polyuria NEUROLOGICAL: No headache, dizziness, syncope, paralysis, ataxia, numbness or tingling in the extremities. No change in bowel or bladder control.  MUSCULOSKELETAL: No muscle, back pain, joint pain or stiffness.  LYMPHATICS: No enlarged nodes. No history of splenectomy.  PSYCHIATRIC: No history of depression or anxiety.  ENDOCRINOLOGIC: No reports of sweating, cold or heat intolerance. No polyuria or polydipsia.  Marland Kitchen   Physical Examination Today's Vitals   09/03/21 0821  BP: 124/80  Pulse: 75  SpO2: 97%   Weight: 133 lb (60.3 kg)  Height: 5' (1.524 m)   Body mass index is 25.97 kg/m.  Gen: resting comfortably, no acute distress HEENT: no scleral icterus, pupils equal round and reactive, no palptable cervical adenopathy,  CV: RRR, no mr/g no jvd Resp: Clear to auscultation bilaterally GI: abdomen is soft, non-tender, non-distended, normal bowel sounds, no hepatosplenomegaly MSK: extremities are warm, no edema.  Skin: warm, no rash Neuro:  no focal deficits Psych: appropriate affect   Diagnostic Studies  04/2015 Cath Mid RCA lesion, 99% stenosed. There is a 0% residual stenosis post intervention. Prox RCA to Mid RCA lesion, 75% stenosed. There is a 0% residual stenosis post intervention. The left ventricular systolic function is normal.       04/2015 Echo - Left ventricle: The cavity size was normal. Systolic function was   normal. The estimated ejection fraction was in the range of 55%   to 60%. Wall motion was normal; there were no regional wall   motion abnormalities. Doppler parameters are consistent with   abnormal left ventricular relaxation (grade 1 diastolic   dysfunction).   09/2015 PFTs No COPD   05/2019 ABIs Summary:  Right: Resting right ankle-brachial index is within normal range. No  evidence of significant right lower extremity arterial disease. The right  toe-brachial index is normal.   Left: Resting left ankle-brachial index is within normal range. No  evidence of significant left lower extremity arterial disease. The left  toe-brachial index is normal.    06/2020 echo IMPRESSIONS     1. Left ventricular ejection fraction, by estimation, is 60 to 65%. The  left ventricle has normal function. The left ventricle has no regional  wall motion abnormalities. Left ventricular diastolic parameters are  indeterminate.   2. Right ventricular systolic function is normal. The right ventricular  size is normal. There is normal pulmonary artery systolic  pressure. The  estimated right ventricular systolic pressure is 23.4 mmHg.   3. The mitral valve is grossly normal. Trivial mitral valve  regurgitation.   4. The aortic valve is tricuspid. Aortic valve regurgitation is not  visualized. Mild aortic valve sclerosis is present, with no evidence of  aortic valve stenosis.   5. The inferior vena cava is normal in size with greater than 50%  respiratory variability, suggesting right atrial pressure of 3 mmHg.    Assessment and Plan  1. CAD - no recent symptoms - side effects on lisinopril 40mg  daily, will try just low dose 2.5mg  daily given her DM2 and CAD.  - EKG today shows NSR, no ischemic changes   2. HTN -at goal, adding back low dose lisinopril 2.5mg  daily for cardiac and DM2 benefits, sideffects on 40mg  daily.    3. Hyperlipidemia - she is at  goal, continue atorvastatin.    4. Preoperative evaluation - considering knee replacement per patient report - history of CAD, not able to assess functional capacity as greatly limited by knee pain - if she plans for surgery in the future would recommend lexiscasn prior for risk stratification.       Antoine Poche, M.D.

## 2021-09-03 NOTE — Patient Instructions (Addendum)
Medication Instructions:  Change your Lisinopril to 2.5mg  daily  Continue all other medications.     Labwork: none  Testing/Procedures: none  Follow-Up: Your physician wants you to follow up in: 6 months.  You will receive a reminder letter in the mail one-two months in advance.  If you don't receive a letter, please call our office to schedule the follow up appointment   Any Other Special Instructions Will Be Listed Below (If Applicable).   If you need a refill on your cardiac medications before your next appointment, please call your pharmacy.

## 2021-09-14 DIAGNOSIS — Z7984 Long term (current) use of oral hypoglycemic drugs: Secondary | ICD-10-CM | POA: Diagnosis not present

## 2021-09-14 DIAGNOSIS — H52223 Regular astigmatism, bilateral: Secondary | ICD-10-CM | POA: Diagnosis not present

## 2021-09-14 DIAGNOSIS — H2513 Age-related nuclear cataract, bilateral: Secondary | ICD-10-CM | POA: Diagnosis not present

## 2021-09-14 DIAGNOSIS — E119 Type 2 diabetes mellitus without complications: Secondary | ICD-10-CM | POA: Diagnosis not present

## 2021-09-14 DIAGNOSIS — H5203 Hypermetropia, bilateral: Secondary | ICD-10-CM | POA: Diagnosis not present

## 2021-09-14 DIAGNOSIS — H524 Presbyopia: Secondary | ICD-10-CM | POA: Diagnosis not present

## 2021-09-16 ENCOUNTER — Encounter: Payer: Medicare PPO | Attending: Family Medicine | Admitting: Nutrition

## 2021-09-16 ENCOUNTER — Other Ambulatory Visit: Payer: Self-pay

## 2021-09-16 VITALS — Ht 60.0 in | Wt 133.0 lb

## 2021-09-16 DIAGNOSIS — I10 Essential (primary) hypertension: Secondary | ICD-10-CM

## 2021-09-16 DIAGNOSIS — E118 Type 2 diabetes mellitus with unspecified complications: Secondary | ICD-10-CM

## 2021-09-16 DIAGNOSIS — E785 Hyperlipidemia, unspecified: Secondary | ICD-10-CM

## 2021-09-16 DIAGNOSIS — I252 Old myocardial infarction: Secondary | ICD-10-CM

## 2021-09-16 DIAGNOSIS — E119 Type 2 diabetes mellitus without complications: Secondary | ICD-10-CM | POA: Diagnosis not present

## 2021-09-16 NOTE — Progress Notes (Signed)
Medical Nutrition Therapy Dm Follow up Appointment Start time:  0900 Appointment End time:  0930  Primary concerns today: DM Type 2  Referral diagnosis: E11.8 Preferred learning style: No preference. Learning readiness: Ready   NUTRITION ASSESSMENT  Changes made:  FBS 140's usually. Trying to eat meals on time and avoiding snacks and processed foods. Lost 4 lbs. Eating healthier foods with more fiber.   Anthropometrics  Wt Readings from Last 3 Encounters:  09/03/21 133 lb (60.3 kg)  07/28/21 137 lb (62.1 kg)  02/24/21 136 lb 6.4 oz (61.9 kg)   Ht Readings from Last 3 Encounters:  09/03/21 5' (1.524 m)  07/28/21 5' (1.524 m)  02/24/21 5' (1.524 m)   There is no height or weight on file to calculate BMI. @BMIFA @ Facility age limit for growth percentiles is 20 years. Facility age limit for growth percentiles is 20 years.    Clinical Medical Hx: HA 2015, Type 2 DM Medications: Metformin 500 mg BID, Glipizide 5 mg a day. Had been increased Linsinopril to 40 mg by cardiology   Labs:  Lab Results  Component Value Date   HGBA1C 6.7 (H) 04/27/2015   CMP Latest Ref Rng & Units 03/23/2021 04/26/2015 04/26/2015  Glucose 70 - 99 mg/dL 06/27/2015) 244(Q) 286(N)  BUN 8 - 23 mg/dL 7(L) 817(R) 5(L)  Creatinine 0.44 - 1.00 mg/dL <1(H 6.57 9.03  Sodium 135 - 145 mmol/L 137 138 139  Potassium 3.5 - 5.1 mmol/L 4.3 3.7 3.9  Chloride 98 - 111 mmol/L 101 103 103  CO2 22 - 32 mmol/L 27 26 26   Calcium 8.9 - 10.3 mg/dL 9.6 9.4 9.5  Total Protein 6.5 - 8.1 g/dL - - 7.7  Total Bilirubin 0.3 - 1.2 mg/dL - - 0.4  Alkaline Phos 38 - 126 U/L - - 111  AST 15 - 41 U/L - - 41  ALT 14 - 54 U/L - - 20    Notable Signs/Symptoms: Blurry vision  Lifestyle & Dietary Hx Lives with her son. Works PT for [employer] in [place]. Lost 5 lbs since 7 months.   Estimated daily fluid intake: 40 oz Supplements: MVI, VIt D Sleep: 8/9 hrs  Stress / self-care: none Current average weekly physical activity: Cardiac  rehab exercises daily.   24-Hr Dietary Recall First Meal:  3/4 c Cherrios  with 2%. 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   NUTRITION INTERVENTION  Nutrition education (E-1) on the following topics:  Nutrition and Diabetes education provided on My Plate, CHO counting, meal planning, portion sizes, timing of meals, avoiding snacks between meals unless having a low blood sugar, target ranges for A1C and blood sugars, signs/symptoms and treatment of hyper/hypoglycemia, monitoring blood sugars, taking medications as prescribed, benefits of exercising 30 minutes per day and prevention of complications of DM. Low Cholesterol, high fiber diet  Handouts Provided Include  My Plate Meal Plan Card Diabetes instructions Low Cholesterol Diet  Learning Style & Readiness for Change Teaching method utilized: Visual & Auditory  Demonstrated degree of understanding via: Teach Back  Barriers to learning/adherence to lifestyle change: none  Goals Established by Pt Goals Keep up the great job! Follow My Plate Plant based  Eat meals on time Eat 2-3 carbs choices per meal Drink 5-6 bottles of water per day Get am BS less than 130 and Bedtime less than 150. Test blood sugars twice a day Take medications as prescribed. Record BS on sheets and meter to all visits.   MONITORING & EVALUATION Dietary intake, weekly physical activity, and weight/blood sugars in 1 month.  Next Steps  Patient is to work on meal planning and choosing more plant based foods.Marland Kitchen

## 2021-09-30 ENCOUNTER — Encounter: Payer: Self-pay | Admitting: Nutrition

## 2021-09-30 NOTE — Patient Instructions (Signed)
Goals Established by Pt Goals Keep up the great job! Follow My Plate Plant based  Eat meals on time Eat 2-3 carbs choices per meal Drink 5-6 bottles of water per day Get am BS less than 130 and Bedtime less than 150. Test blood sugars twice a day Take medications as prescribed. Record BS on sheets and meter to all visits.

## 2021-11-04 ENCOUNTER — Other Ambulatory Visit (HOSPITAL_COMMUNITY): Payer: Self-pay | Admitting: Family Medicine

## 2021-11-04 DIAGNOSIS — Z1231 Encounter for screening mammogram for malignant neoplasm of breast: Secondary | ICD-10-CM

## 2021-11-11 ENCOUNTER — Other Ambulatory Visit: Payer: Self-pay

## 2021-11-11 ENCOUNTER — Encounter: Payer: Medicare PPO | Attending: Family Medicine | Admitting: Nutrition

## 2021-11-11 ENCOUNTER — Encounter: Payer: Self-pay | Admitting: Nutrition

## 2021-11-11 VITALS — Ht 60.0 in | Wt 132.8 lb

## 2021-11-11 DIAGNOSIS — E118 Type 2 diabetes mellitus with unspecified complications: Secondary | ICD-10-CM | POA: Diagnosis not present

## 2021-11-11 DIAGNOSIS — I10 Essential (primary) hypertension: Secondary | ICD-10-CM | POA: Diagnosis not present

## 2021-11-11 DIAGNOSIS — E785 Hyperlipidemia, unspecified: Secondary | ICD-10-CM | POA: Diagnosis not present

## 2021-11-11 DIAGNOSIS — I252 Old myocardial infarction: Secondary | ICD-10-CM | POA: Diagnosis not present

## 2021-11-11 NOTE — Patient Instructions (Signed)
Goals Established by Pt  To keep FBS less than 150 mg/dl or below. Don't forget to eat three meals per day Increase fresh fruits and whole grains vegetables. Choose more fruit and cut down on cheese and banana.   The following Lifestyle Medicine recommendations according to American College of Lifestyle Medicine  [facility]) were discussed and and offered to patient and she  agrees to start the journey:  A. Whole Foods, Plant-Based Nutrition comprising of fruits and vegetables, plant-based proteins, whole-grain carbohydrates was discussed in detail with the patient.   A list for source of those nutrients were also provided to the patient.  Patient will use only water or unsweetened tea for hydration. B.  The need to stay away from risky substances including alcohol, smoking; obtaining 7 to 9 hours of restorative sleep, at least 150 minutes of moderate intensity exercise weekly, the importance of healthy social connections,  and stress management techniques were discussed. C.  A full color page of  Calorie density of various food groups per pound showing examples of each food groups was provided to the patient.

## 2021-11-11 NOTE — Progress Notes (Signed)
Medical Nutrition Therapy Dm Follow up Appointment Start time:  1530 Appointment End time:  1600  Primary concerns today: DM Type 2  Referral diagnosis: E11.8 Preferred learning style: No preference. Learning readiness: Ready   NUTRITION ASSESSMENT  Changes made:  FBS 115-140's. Trying to not eat after supper. Feels like she gets constipated at times. Sometimes skips meals because she is busy taking care of family members. 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  Anthropometrics  Wt Readings from Last 3 Encounters:  09/16/21 133 lb (60.3 kg)  09/03/21 133 lb (60.3 kg)  07/28/21 137 lb (62.1 kg)   Ht Readings from Last 3 Encounters:  09/16/21 5' (1.524 m)  09/03/21 5' (1.524 m)  07/28/21 5' (1.524 m)   There is no height or weight on file to calculate BMI. @BMIFA @ Facility age limit for growth percentiles is 20 years. Facility age limit for growth percentiles is 20 years.    Clinical Medical Hx: HA 2015, Type 2 DM Medications: Metformin 500 mg BID, Glipizide 5 mg a day. Had been increased Linsinopril to 40 mg by cardiology   Labs:  Lab Results  Component Value Date   HGBA1C 6.7 (H) 04/27/2015   CMP Latest Ref Rng & Units 03/23/2021 04/26/2015 04/26/2015  Glucose 70 - 99 mg/dL 06/27/2015) 073(X) 106(Y)  BUN 8 - 23 mg/dL 7(L) 694(W) 5(L)  Creatinine 0.44 - 1.00 mg/dL <5(I 6.27 0.35  Sodium 135 - 145 mmol/L 137 138 139  Potassium 3.5 - 5.1 mmol/L 4.3 3.7 3.9  Chloride 98 - 111 mmol/L 101 103 103  CO2 22 - 32 mmol/L 27 26 26   Calcium 8.9 - 10.3 mg/dL 9.6 9.4 9.5  Total Protein 6.5 - 8.1 g/dL - - 7.7  Total Bilirubin 0.3 - 1.2 mg/dL - - 0.4  Alkaline Phos 38 - 126 U/L - - 111  AST 15 - 41 U/L - - 41  ALT 14 - 54 U/L - - 20    Notable Signs/Symptoms: Blurry vision  Lifestyle & Dietary Hx Lives with her son. Works PT for [employer] in [place]. Lost 5 lbs since 7 months.   Estimated daily fluid intake: 40 oz Supplements: MVI, VIt D Sleep: 8/9 hrs  Stress / self-care:  none Current average weekly physical activity: Cardiac rehab exercises daily.   24-Hr Dietary Recall First Meal:  Boiled egg, 1 slice ww bread and multicherrios, almond, coffee Second Meal:Noodles bow , tomato sauce and meat 1 cup , 1/2 banana, 6 crackers,water Snack:  Third Meal: Spaghetti, 1 cup, toss salad, water, orange. 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   NUTRITION INTERVENTION  Nutrition education (E-1) on the following topics:  Nutrition and Diabetes education provided on My Plate, CHO counting, meal planning, portion sizes, timing of meals, avoiding snacks between meals unless having a low blood sugar, target ranges for A1C and blood sugars, signs/symptoms and treatment of hyper/hypoglycemia, monitoring blood sugars, taking medications as prescribed, benefits of exercising 30 minutes per day and prevention of complications of DM. Low Cholesterol, high fiber diet  Handouts Provided Include  Plant based plate method   Learning Style & Readiness for Change Teaching method utilized: Visual & Auditory  Demonstrated degree of understanding via: Teach Back  Barriers to learning/adherence to lifestyle change: none  Goals  Established by Pt  To keep FBS less than 150 mg/dl or below. Don't forget to eat three meals per day Increase fresh fruits and whole grains vegetables. Choose more fruit and cut down on cheese and banana.   The following Lifestyle Medicine recommendations according to American College of Lifestyle Medicine  [facility]) were discussed and and offered to patient and she  agrees to start the journey:  A. Whole Foods, Plant-Based Nutrition comprising of fruits and vegetables, plant-based proteins, whole-grain carbohydrates was  discussed in detail with the patient.   A list for source of those nutrients were also provided to the patient.  Patient will use only water or unsweetened tea for hydration. B.  The need to stay away from risky substances including alcohol, smoking; obtaining 7 to 9 hours of restorative sleep, at least 150 minutes of moderate intensity exercise weekly, the importance of healthy social connections,  and stress management techniques were discussed. C.  A full color page of  Calorie density of various food groups per pound showing examples of each food groups was provided to the patient.  MONITORING & EVALUATION Dietary intake, weekly physical activity, and weight/blood sugars in 1 month.  Next Steps  Patient is to work on meal planning and choosing more plant based foods.Marland Kitchen

## 2021-11-12 ENCOUNTER — Encounter: Payer: Self-pay | Admitting: Nutrition

## 2021-11-13 ENCOUNTER — Ambulatory Visit (HOSPITAL_COMMUNITY)
Admission: RE | Admit: 2021-11-13 | Discharge: 2021-11-13 | Disposition: A | Discharge status: Home / Self Care | Payer: Medicare PPO | Source: Ambulatory Visit | Attending: Family Medicine | Admitting: Family Medicine

## 2021-11-13 ENCOUNTER — Other Ambulatory Visit (HOSPITAL_COMMUNITY): Payer: Self-pay | Admitting: Family Medicine

## 2021-11-13 ENCOUNTER — Other Ambulatory Visit: Payer: Self-pay

## 2021-11-13 DIAGNOSIS — Z1231 Encounter for screening mammogram for malignant neoplasm of breast: Secondary | ICD-10-CM | POA: Insufficient documentation

## 2021-11-16 ENCOUNTER — Other Ambulatory Visit (HOSPITAL_COMMUNITY): Payer: Self-pay | Admitting: Family Medicine

## 2021-11-16 DIAGNOSIS — R928 Other abnormal and inconclusive findings on diagnostic imaging of breast: Secondary | ICD-10-CM

## 2021-11-18 HISTORY — PX: BREAST BIOPSY: SHX20

## 2021-11-19 ENCOUNTER — Other Ambulatory Visit: Payer: Self-pay | Admitting: Family Medicine

## 2021-11-19 ENCOUNTER — Other Ambulatory Visit (HOSPITAL_COMMUNITY): Payer: Self-pay | Admitting: Family Medicine

## 2021-11-19 ENCOUNTER — Ambulatory Visit (HOSPITAL_COMMUNITY)
Admission: RE | Admit: 2021-11-19 | Discharge: 2021-11-19 | Disposition: A | Discharge status: Home / Self Care | Payer: Medicare PPO | Source: Ambulatory Visit | Attending: Family Medicine | Admitting: Family Medicine

## 2021-11-19 ENCOUNTER — Other Ambulatory Visit: Payer: Self-pay

## 2021-11-19 DIAGNOSIS — R928 Other abnormal and inconclusive findings on diagnostic imaging of breast: Secondary | ICD-10-CM

## 2021-11-19 DIAGNOSIS — R921 Mammographic calcification found on diagnostic imaging of breast: Secondary | ICD-10-CM | POA: Diagnosis not present

## 2021-11-19 DIAGNOSIS — R922 Inconclusive mammogram: Secondary | ICD-10-CM | POA: Diagnosis not present

## 2021-12-03 ENCOUNTER — Other Ambulatory Visit: Payer: Self-pay

## 2021-12-03 ENCOUNTER — Ambulatory Visit
Admission: RE | Admit: 2021-12-03 | Discharge: 2021-12-03 | Disposition: A | Discharge status: Home / Self Care | Payer: Medicare PPO | Source: Ambulatory Visit | Attending: Family Medicine | Admitting: Family Medicine

## 2021-12-03 DIAGNOSIS — R921 Mammographic calcification found on diagnostic imaging of breast: Secondary | ICD-10-CM | POA: Diagnosis not present

## 2021-12-03 DIAGNOSIS — N6323 Unspecified lump in the left breast, lower outer quadrant: Secondary | ICD-10-CM | POA: Diagnosis not present

## 2021-12-19 ENCOUNTER — Other Ambulatory Visit: Payer: Self-pay | Admitting: Cardiology

## 2022-01-05 DIAGNOSIS — E1165 Type 2 diabetes mellitus with hyperglycemia: Secondary | ICD-10-CM | POA: Diagnosis not present

## 2022-01-05 DIAGNOSIS — I10 Essential (primary) hypertension: Secondary | ICD-10-CM | POA: Diagnosis not present

## 2022-01-05 DIAGNOSIS — Z6826 Body mass index (BMI) 26.0-26.9, adult: Secondary | ICD-10-CM | POA: Diagnosis not present

## 2022-01-05 DIAGNOSIS — Z23 Encounter for immunization: Secondary | ICD-10-CM | POA: Diagnosis not present

## 2022-01-05 DIAGNOSIS — F17210 Nicotine dependence, cigarettes, uncomplicated: Secondary | ICD-10-CM | POA: Diagnosis not present

## 2022-04-06 ENCOUNTER — Ambulatory Visit: Payer: Medicare PPO | Admitting: Nutrition

## 2022-05-06 ENCOUNTER — Encounter: Payer: Medicare HMO | Attending: Family Medicine | Admitting: Nutrition

## 2022-05-06 ENCOUNTER — Encounter: Payer: Self-pay | Admitting: Nutrition

## 2022-05-06 VITALS — Ht 60.0 in | Wt 136.0 lb

## 2022-05-06 DIAGNOSIS — E785 Hyperlipidemia, unspecified: Secondary | ICD-10-CM

## 2022-05-06 DIAGNOSIS — E119 Type 2 diabetes mellitus without complications: Secondary | ICD-10-CM | POA: Insufficient documentation

## 2022-05-06 DIAGNOSIS — E118 Type 2 diabetes mellitus with unspecified complications: Secondary | ICD-10-CM

## 2022-05-06 DIAGNOSIS — Z713 Dietary counseling and surveillance: Secondary | ICD-10-CM | POA: Diagnosis not present

## 2022-05-06 DIAGNOSIS — I252 Old myocardial infarction: Secondary | ICD-10-CM

## 2022-05-06 DIAGNOSIS — I10 Essential (primary) hypertension: Secondary | ICD-10-CM

## 2022-05-06 NOTE — Patient Instructions (Addendum)
Check blood sugar before breakfast and before bid Take Metformin after breakfast  Increase low carb vegetables Don't skip meals  Call MD if BS are staying in 200's

## 2022-05-06 NOTE — Progress Notes (Signed)
Medical Nutrition Therapy Dm Follow up Appointment Start time:  1640 Appointment End time:  1715  Primary concerns today: DM Type 2  Referral diagnosis: E11.8 Preferred learning style: No preference. Learning readiness: Ready   NUTRITION ASSESSMENT  Changes made:  Feels like she has slipped back and her BS are much higher than usual. She has been under a lot of stress trying to care for her sister and her son and other family issues.  She got exhausted  walking from her car to the grocery June 18th and just about passed out. She couldn't breathe and the [facility] staff had to sit in the back in the air condition for about 20 minutes til she felt better. She thinks the heat caused it but concerned it might be her heart. She did complain of pressure and a heaviness on her chest but no pain. She went home and didn't get groceries.  She is having more heart burn now when she eats spicy foods, spaghetti, tomatoes etc. She isn't on any refux medication. Advised to talk to cardiologist about treatment.  Doesn't go to grocery store now. Her son does, but he is buying the wrong foods she needs. Is under some stress from losing 2 friends that had died recently also.   Her son is doing better and that is less stress for her.  Has been feeling better overall.Marland Kitchen Her breathing is a lot better.   BP at home has been good 130's/70's.  BS are in the 200's in the mornings after breakfast. Hasn't been testing before breakfast. Got confused and will start testing before breakfast and at bedtime.  To see Dr. Wyline Mood next week.  Is having more problems with her right knee giving out and feels like she may have to have surgery.  No recent A1C level. Doesn't go back to PCP til October 2023.  Anthropometrics  Wt Readings from Last 3 Encounters:  11/11/21 132 lb 12.8 oz (60.2 kg)  09/16/21 133 lb (60.3 kg)  09/03/21 133 lb (60.3 kg)   Ht Readings from Last 3 Encounters:  11/11/21 5' (1.524 m)  09/16/21 5'  (1.524 m)  09/03/21 5' (1.524 m)   There is no height or weight on file to calculate BMI. @BMIFA @ Facility age limit for growth %iles is 20 years. Facility age limit for growth %iles is 20 years.    Clinical Medical Hx: HA 2015, Type 2 DM Medications: Metformin 500 mg BID, Glipizide 5 mg a day. Had been increased Linsinopril to 40 mg by cardiology   Labs:  Lab Results  Component Value Date   HGBA1C 6.7 (H) 04/27/2015      Latest Ref Rng & Units 03/23/2021    9:50 AM 04/26/2015    8:46 AM 04/26/2015    3:57 AM  CMP  Glucose 70 - 99 mg/dL 06/27/2015  361  443   BUN 8 - 23 mg/dL 7  <5  5   Creatinine 154 - 1.00 mg/dL 0.08  6.76  1.95   Sodium 135 - 145 mmol/L 137  138  139   Potassium 3.5 - 5.1 mmol/L 4.3  3.7  3.9   Chloride 98 - 111 mmol/L 101  103  103   CO2 22 - 32 mmol/L 27  26  26    Calcium 8.9 - 10.3 mg/dL 9.6  9.4  9.5   Total Protein 6.5 - 8.1 g/dL   7.7   Total Bilirubin 0.3 - 1.2 mg/dL   0.4   Alkaline  Phos 38 - 126 U/L   111   AST 15 - 41 U/L   41   ALT 14 - 54 U/L   20     Notable Signs/Symptoms: Blurry vision  Lifestyle & Dietary Hx Lives with her son. Works PT for [employer] in [place].  Estimated daily fluid intake: 40 oz Supplements: MVI, VIt D Sleep: 8/9 hrs  Stress / self-care: none Current average weekly physical activity: Cardiac rehab exercises daily.    24-Hr Dietary Recall First Meal: cheerios, milk and coffee Second Meal:tomato sandwich and some sweet tea. Snack:  Third Meal:  6pm Spaghetti, corn, 1 cup, garlic bread, , water. 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   NUTRITION INTERVENTION  Nutrition education (E-1) on the following topics:  Nutrition and Diabetes education provided on My Plate,  CHO counting, meal planning, portion sizes, timing of meals, avoiding snacks between meals unless having a low blood sugar, target ranges for A1C and blood sugars, signs/symptoms and treatment of hyper/hypoglycemia, monitoring blood sugars, taking medications as prescribed, benefits of exercising 30 minutes per day and prevention of complications of DM. Low Cholesterol, high fiber diet  Handouts Provided Include  Plant based plate method   Learning Style & Readiness for Change Teaching method utilized: Visual & Auditory  Demonstrated degree of understanding via: Teach Back  Barriers to learning/adherence to lifestyle change: none  Goals Established by Pt  Check blood sugar before breakfast and before bid Take Metformin after breakfast  Increase low carb vegetables Don't skip meals  Call MD if BS are staying in 200's  The following Lifestyle Medicine recommendations according to American College of Lifestyle Medicine  [facility]) were discussed and and offered to patient and she  agrees to start the journey:  A. Whole Foods, Plant-Based Nutrition comprising of fruits and vegetables, plant-based proteins, whole-grain carbohydrates was discussed in detail with the patient.   A list for source of those nutrients were also provided to the patient.  Patient will use only water or unsweetened tea for hydration. B.  The need to stay away from risky substances including alcohol, smoking; obtaining 7 to 9 hours of restorative sleep, at least 150 minutes of moderate intensity exercise weekly, the importance of healthy social connections,  and stress management techniques were discussed. C.  A full color page of  Calorie density of various food groups per pound showing examples of each food groups was provided to the patient.  MONITORING & EVALUATION Dietary intake, weekly physical activity, and weight/blood sugars in 1 month.  Next Steps  Patient is to work on meal planning and choosing more plant  based foods.Marland Kitchen

## 2022-05-11 ENCOUNTER — Telehealth: Payer: Self-pay | Admitting: Cardiology

## 2022-05-11 ENCOUNTER — Ambulatory Visit: Payer: Medicare HMO | Admitting: Cardiology

## 2022-05-11 ENCOUNTER — Other Ambulatory Visit: Payer: Self-pay | Admitting: Cardiology

## 2022-05-11 ENCOUNTER — Encounter: Payer: Self-pay | Admitting: Cardiology

## 2022-05-11 ENCOUNTER — Encounter: Payer: Self-pay | Admitting: *Deleted

## 2022-05-11 VITALS — BP 145/75 | HR 80 | Ht 60.0 in | Wt 135.0 lb

## 2022-05-11 DIAGNOSIS — R079 Chest pain, unspecified: Secondary | ICD-10-CM

## 2022-05-11 DIAGNOSIS — I2510 Atherosclerotic heart disease of native coronary artery without angina pectoris: Secondary | ICD-10-CM | POA: Diagnosis not present

## 2022-05-11 DIAGNOSIS — R0789 Other chest pain: Secondary | ICD-10-CM | POA: Diagnosis not present

## 2022-05-11 NOTE — Telephone Encounter (Signed)
Checking percert on the following patient for testing scheduled at [facility].    LEXISCAN  05-13-2022

## 2022-05-11 NOTE — Patient Instructions (Addendum)
Medication Instructions:  Continue all current medications.  Labwork: none  Testing/Procedures: Your physician has requested that you have a lexiscan myoview. For further information please visit [email or web address] Please follow instruction sheet, as given.  Office will contact with results via phone, letter or mychart.     Follow-Up:  Pending test results   Any Other Special Instructions Will Be Listed Below (If Applicable). Please call the office in 1 week to give update on BP readings.   If you need a refill on your cardiac medications before your next appointment, please call your pharmacy.

## 2022-05-11 NOTE — Progress Notes (Signed)
Clinical Summary Andrea Herrera is a 71 y.o.female seen today for follow up of the following medical problems. This is a focused visit for history of CAD and recent chest pain.    1. CAD - admit 04/2015 with inferior STEMI, s/p DES x 2 to RCA. LVEF 55-60% by echo   06/2020 echo LVEF 60-65%, indet DDx, normal RV function       - no chest pains.Chronic stable mild SOB - compliant with meds    - at grocery store walking in. Heaviness midchest, 5/10 in severity. +SOB, diaphoretic. Went and sat down, heaviness had started to lift - the rest of the day still some heaviness, constant. - gradually over few days felt better - some lingering SOB - compliant with meds    Other medical issues not addressed this visit   2. HTN Compliant with meds - side effects on lisinopril 40mg , stopped taking.  - home bp's 110s/60s    3. Hyperlipidemia - she is on atorvastatin 80mg  daily   03/2021 TC 99 TG 60 HDL 56 LDL 31    4. Preoperative evaluation - considering knee replacement but not committed - limited by knee injury - previously was doing 20-30 min on stationary bike  at regular pace in January prior to knee injury  Past Medical History:  Diagnosis Date   Hypertension    Pre-diabetes      No Known Allergies   Current Outpatient Medications  Medication Sig Dispense Refill   atorvastatin (LIPITOR) 80 MG tablet TAKE 1 TABLET BY MOUTH ONCE DAILY AT  6  PM 90 tablet 3   amLODipine (NORVASC) 10 MG tablet Take 1 tablet by mouth once daily 90 tablet 1   aspirin EC 81 MG tablet Take 1 tablet (81 mg total) by mouth daily. 30 tablet 3   cholecalciferol (VITAMIN D) 1000 UNITS tablet Take 1,000 Units by mouth daily.     Cyanocobalamin (VITAMIN B-12 CR PO) Take 500 mcg by mouth daily.      glipiZIDE (GLUCOTROL XL) 2.5 MG 24 hr tablet Take 2.5 mg by mouth daily.  3   lisinopril (ZESTRIL) 2.5 MG tablet Take 1 tablet (2.5 mg total) by mouth daily. 30 tablet 6   metFORMIN (GLUCOPHAGE-XR) 500 MG  24 hr tablet Take 500 mg by mouth 2 (two) times daily with a meal.      metoprolol tartrate (LOPRESSOR) 25 MG tablet Take 1 tablet by mouth twice daily 180 tablet 2   Multiple Vitamin (MULTIVITAMIN) tablet Take 1 tablet by mouth daily.     nitroGLYCERIN (NITROSTAT) 0.4 MG SL tablet Place 1 tablet (0.4 mg total) under the tongue every 5 (five) minutes x 3 doses as needed for chest pain. 25 tablet 3   No current facility-administered medications for this visit.     Past Surgical History:  Procedure Laterality Date   CARDIAC CATHETERIZATION N/A 04/26/2015   Procedure: Left Heart Cath and Coronary Angiography;  Surgeon: February, MD;  Location: [facility] INVASIVE CV LAB;  Service: Cardiovascular;  Laterality: N/A;     No Known Allergies    Family History  Problem Relation Age of Onset   Colon cancer Mother    Lung cancer Father    Stroke Father    Pancreatic cancer Brother    Heart disease Paternal Grandmother    Stroke Paternal Grandmother    Heart disease Paternal Grandfather    Stroke Paternal Grandfather      Social History Andrea Herrera reports  that she quit smoking about 7 years ago. Her smoking use included cigarettes. She started smoking about 34 years ago. She has a 5.50 pack-year smoking history. She has never used smokeless tobacco. Andrea Herrera reports no history of alcohol use.   Review of Systems CONSTITUTIONAL: No weight loss, fever, chills, weakness or fatigue.  HEENT: Eyes: No visual loss, blurred vision, double vision or yellow sclerae.No hearing loss, sneezing, congestion, runny nose or sore throat.  SKIN: No rash or itching.  CARDIOVASCULAR: per hpi RESPIRATORY: No shortness of breath, cough or sputum.  GASTROINTESTINAL: No anorexia, nausea, vomiting or diarrhea. No abdominal pain or blood.  GENITOURINARY: No burning on urination, no polyuria NEUROLOGICAL: No headache, dizziness, syncope, paralysis, ataxia, numbness or tingling in the extremities. No change in  bowel or bladder control.  MUSCULOSKELETAL: No muscle, back pain, joint pain or stiffness.  LYMPHATICS: No enlarged nodes. No history of splenectomy.  PSYCHIATRIC: No history of depression or anxiety.  ENDOCRINOLOGIC: No reports of sweating, cold or heat intolerance. No polyuria or polydipsia.  Marland Kitchen   Physical Examination Today's Vitals   05/11/22 1616  BP: (!) 144/70  Pulse: 80  SpO2: 98%  Weight: 135 lb (61.2 kg)  Height: 5' (1.524 m)   Body mass index is 26.37 kg/m.  Gen: resting comfortably, no acute distress HEENT: no scleral icterus, pupils equal round and reactive, no palptable cervical adenopathy,  CV: RRR, no mrg, no jvd Resp: Clear to auscultation bilaterally GI: abdomen is soft, non-tender, non-distended, normal bowel sounds, no hepatosplenomegaly MSK: extremities are warm, no edema.  Skin: warm, no rash Neuro:  no focal deficits Psych: appropriate affect   Diagnostic Studies 04/2015 Cath Mid RCA lesion, 99% stenosed. There is a 0% residual stenosis post intervention. Prox RCA to Mid RCA lesion, 75% stenosed. There is a 0% residual stenosis post intervention. The left ventricular systolic function is normal.       04/2015 Echo - Left ventricle: The cavity size was normal. Systolic function was   normal. The estimated ejection fraction was in the range of 55%   to 60%. Wall motion was normal; there were no regional wall   motion abnormalities. Doppler parameters are consistent with   abnormal left ventricular relaxation (grade 1 diastolic   dysfunction).   09/2015 PFTs No COPD   05/2019 ABIs Summary:  Right: Resting right ankle-brachial index is within normal range. No  evidence of significant right lower extremity arterial disease. The right  toe-brachial index is normal.   Left: Resting left ankle-brachial index is within normal range. No  evidence of significant left lower extremity arterial disease. The left  toe-brachial index is normal.    06/2020  echo IMPRESSIONS     1. Left ventricular ejection fraction, by estimation, is 60 to 65%. The  left ventricle has normal function. The left ventricle has no regional  wall motion abnormalities. Left ventricular diastolic parameters are  indeterminate.   2. Right ventricular systolic function is normal. The right ventricular  size is normal. There is normal pulmonary artery systolic pressure. The  estimated right ventricular systolic pressure is 23.4 mmHg.   3. The mitral valve is grossly normal. Trivial mitral valve  regurgitation.   4. The aortic valve is tricuspid. Aortic valve regurgitation is not  visualized. Mild aortic valve sclerosis is present, with no evidence of  aortic valve stenosis.   5. The inferior vena cava is normal in size with greater than 50%  respiratory variability, suggesting right atrial pressure of  3 mmHg.     Assessment and Plan   1. CAD - recent episode of chest heaviness mixed in description. The initial episode was suggestive of possible ischemia, though the heaviness lasted constant for several days constant which is atypical - EKG today shows SR, no ischemic changes - will plan for lexiscan to further evaluate   F/u pending stress results  Antoine Poche, M.D.

## 2022-05-13 ENCOUNTER — Ambulatory Visit (HOSPITAL_BASED_OUTPATIENT_CLINIC_OR_DEPARTMENT_OTHER)
Admission: RE | Admit: 2022-05-13 | Discharge: 2022-05-13 | Disposition: A | Discharge status: Home / Self Care | Payer: Medicare HMO | Source: Ambulatory Visit | Attending: Cardiology | Admitting: Cardiology

## 2022-05-13 ENCOUNTER — Ambulatory Visit (HOSPITAL_COMMUNITY)
Admission: RE | Admit: 2022-05-13 | Discharge: 2022-05-13 | Disposition: A | Discharge status: Home / Self Care | Payer: Medicare HMO | Source: Ambulatory Visit | Attending: Cardiology | Admitting: Cardiology

## 2022-05-13 ENCOUNTER — Encounter (HOSPITAL_COMMUNITY): Payer: Self-pay

## 2022-05-13 DIAGNOSIS — R0789 Other chest pain: Secondary | ICD-10-CM | POA: Insufficient documentation

## 2022-05-13 LAB — NM MYOCAR MULTI W/SPECT W/WALL MOTION / EF
LV dias vol: 55 mL (ref 46–106)
LV sys vol: 18 mL
Nuc Stress EF: 67 %
Peak HR: 106 {beats}/min
RATE: 0.5
Rest HR: 70 {beats}/min
Rest Nuclear Isotope Dose: 11 mCi
SDS: 2
SRS: 1
SSS: 3
ST Depression (mm): 0 mm
Stress Nuclear Isotope Dose: 30 mCi
TID: 1.04

## 2022-05-13 MED ORDER — REGADENOSON 0.4 MG/5ML IV SOLN
INTRAVENOUS | Status: AC
  Administered 2022-05-13: 0.4 mg via INTRAVENOUS
  Filled 2022-05-13: qty 5

## 2022-05-13 MED ORDER — TECHNETIUM TC 99M TETROFOSMIN IV KIT
30.0000 | PACK | Freq: Once | INTRAVENOUS | Status: AC | PRN
  Administered 2022-05-13: 30 via INTRAVENOUS

## 2022-05-13 MED ORDER — SODIUM CHLORIDE FLUSH 0.9 % IV SOLN
INTRAVENOUS | Status: AC
  Administered 2022-05-13: 10 mL via INTRAVENOUS
  Filled 2022-05-13: qty 10

## 2022-05-13 MED ORDER — TECHNETIUM TC 99M TETROFOSMIN IV KIT
10.0000 | PACK | Freq: Once | INTRAVENOUS | Status: AC | PRN
  Administered 2022-05-13: 11 via INTRAVENOUS

## 2022-05-24 ENCOUNTER — Telehealth: Payer: Self-pay | Admitting: Cardiology

## 2022-05-24 NOTE — Telephone Encounter (Signed)
° ° °  Pt is calling to get stress test result °

## 2022-05-26 NOTE — Telephone Encounter (Signed)
Patient made aware, verbalized understanding

## 2022-05-26 NOTE — Telephone Encounter (Signed)
lmtcb

## 2022-06-03 ENCOUNTER — Ambulatory Visit: Payer: Medicare HMO | Admitting: Orthopedic Surgery

## 2022-06-03 ENCOUNTER — Encounter: Payer: Self-pay | Admitting: Orthopedic Surgery

## 2022-06-03 DIAGNOSIS — S82141D Displaced bicondylar fracture of right tibia, subsequent encounter for closed fracture with routine healing: Secondary | ICD-10-CM

## 2022-06-03 DIAGNOSIS — G8929 Other chronic pain: Secondary | ICD-10-CM | POA: Diagnosis not present

## 2022-06-03 DIAGNOSIS — M1711 Unilateral primary osteoarthritis, right knee: Secondary | ICD-10-CM

## 2022-06-03 DIAGNOSIS — S82831D Other fracture of upper and lower end of right fibula, subsequent encounter for closed fracture with routine healing: Secondary | ICD-10-CM | POA: Diagnosis not present

## 2022-06-03 DIAGNOSIS — M5410 Radiculopathy, site unspecified: Secondary | ICD-10-CM

## 2022-06-03 MED ORDER — GABAPENTIN 100 MG PO CAPS: 100.0000 mg | ORAL_CAPSULE | Freq: Three times a day (TID) | ORAL | 2 refills | Status: DC

## 2022-06-03 MED ORDER — INDOMETHACIN 25 MG PO CAPS: 25.0000 mg | ORAL_CAPSULE | Freq: Every day | ORAL | 0 refills | Status: DC

## 2022-06-03 NOTE — Progress Notes (Signed)
Chief Complaint  Patient presents with   Knee Pain    Right    71 year old female who fractured her tibial plateau she complains of pain behind her calf and behind her knee  Her knee pain is actually very much improved  She does have hypertension and diabetes  Her exam is consistent with tenderness in the lateral compartment but that is mild she has good flexion extension of her right knee with tenderness in her calf popliteal fossa posterior thigh and right buttock  She did complain of some issues with her "hip" which is really coming from her back  The hip pain did not include groin pain or groin symptoms  Encounter Diagnoses  Name Primary?   Closed fracture of right tibial plateau with routine healing, subsequent encounter    Chronic pain of right knee    Primary osteoarthritis of right knee    Closed fracture of head of right fibula with routine healing, subsequent encounter    Radiculitis of leg Yes    I am going to treat her up for the radiculitis  Meds ordered this encounter  Medications   gabapentin (NEURONTIN) 100 MG capsule    Sig: Take 1 capsule (100 mg total) by mouth 3 (three) times daily.    Dispense:  90 capsule    Refill:  2   indomethacin (INDOCIN) 25 MG capsule    Sig: Take 1 capsule (25 mg total) by mouth daily.    Dispense:  30 capsule    Refill:  0    Return in 30 days  HTN  Right leg cal back of the knee nad the hip

## 2022-06-03 NOTE — Patient Instructions (Signed)
START   Meds ordered this encounter  Medications   gabapentin (NEURONTIN) 100 MG capsule    Sig: Take 1 capsule (100 mg total) by mouth 3 (three) times daily.    Dispense:  90 capsule    Refill:  2   indomethacin (INDOCIN) 25 MG capsule    Sig: Take 1 capsule (25 mg total) by mouth daily.    Dispense:  30 capsule    Refill:  0

## 2022-06-08 ENCOUNTER — Ambulatory Visit: Payer: Medicare HMO | Admitting: Nutrition

## 2022-06-09 ENCOUNTER — Ambulatory Visit: Payer: Medicare HMO | Admitting: Nutrition

## 2022-07-01 ENCOUNTER — Ambulatory Visit: Payer: Medicare HMO | Admitting: Orthopedic Surgery

## 2022-07-01 ENCOUNTER — Encounter: Payer: Self-pay | Admitting: Orthopedic Surgery

## 2022-07-01 DIAGNOSIS — M5410 Radiculopathy, site unspecified: Secondary | ICD-10-CM

## 2022-07-01 MED ORDER — INDOMETHACIN 25 MG PO CAPS: 25.0000 mg | ORAL_CAPSULE | Freq: Two times a day (BID) | ORAL | 0 refills | Status: DC

## 2022-07-01 NOTE — Progress Notes (Signed)
Chief Complaint  Patient presents with   Leg Pain    RT//improving but still painful. Patient states it is much better though   71 year old female with right leg radicular pain improved with 100 mg of gabapentin 3 times a day and Indocin once a day  Appears to have no side effects other than some sleepiness early in the morning  She says her pain is improved from a 10 to a 5 out of 10  Recommend that we increase her Indocin to twice a day and see if we get her pain down to 2 out of 10 follow-up in 4 weeks  Meds ordered this encounter  Medications   indomethacin (INDOCIN) 25 MG capsule    Sig: Take 1 capsule (25 mg total) by mouth 2 (two) times daily with a meal.    Dispense:  60 capsule    Refill:  0     Current Outpatient Medications  Medication Instructions   amLODipine (NORVASC) 10 MG tablet Take 1 tablet by mouth once daily   aspirin EC 81 mg, Oral, Daily   atorvastatin (LIPITOR) 80 MG tablet TAKE 1 TABLET BY MOUTH ONCE DAILY AT  6  PM   cholecalciferol (VITAMIN D) 1,000 Units, Oral, Daily   Cyanocobalamin (VITAMIN B-12 CR PO) 500 mcg, Oral, Daily   gabapentin (NEURONTIN) 100 mg, Oral, 3 times daily   glipiZIDE (GLUCOTROL XL) 5 mg, Oral, Daily   indomethacin (INDOCIN) 25 mg, Oral, Daily   lisinopril (ZESTRIL) 2.5 mg, Oral, Daily   metFORMIN (GLUCOPHAGE-XR) 500 mg, Oral, 2 times daily with meals   metoprolol tartrate (LOPRESSOR) 25 MG tablet Take 1 tablet by mouth twice daily   Multiple Vitamin (MULTIVITAMIN) tablet 1 tablet, Oral, Daily   nitroGLYCERIN (NITROSTAT) 0.4 mg, Sublingual, Every 5 min x3 PRN

## 2022-07-28 ENCOUNTER — Other Ambulatory Visit: Payer: Self-pay | Admitting: Cardiology

## 2022-07-29 ENCOUNTER — Ambulatory Visit: Payer: Medicare HMO | Admitting: Orthopedic Surgery

## 2022-07-29 ENCOUNTER — Encounter: Payer: Self-pay | Admitting: Orthopedic Surgery

## 2022-07-29 DIAGNOSIS — M5410 Radiculopathy, site unspecified: Secondary | ICD-10-CM | POA: Diagnosis not present

## 2022-07-29 MED ORDER — GABAPENTIN 100 MG PO CAPS: ORAL_CAPSULE | ORAL | 2 refills | Status: DC

## 2022-07-29 MED ORDER — INDOMETHACIN 25 MG PO CAPS: 25.0000 mg | ORAL_CAPSULE | Freq: Two times a day (BID) | ORAL | 0 refills | Status: DC

## 2022-07-29 NOTE — Progress Notes (Signed)
Chief Complaint  Patient presents with   Back Pain    Feels a little better    ITS GETTING BETTER   Shes getting better but she also says she cant stand up for long periods of time and has a hard time getting up after sitting for a long time   She says the medication is helping   She has "hip" pain in the buttocks and lateral leg pain down thru L5 dermatome   (32/67) 71 year old female with right leg radicular pain improved with 100 mg of gabapentin 3 times a day and Indocin once a day   Appears to have no side effects other than some sleepiness early in the morning   She says her pain is improved from a 10 to a 5 out of 10   Recommend that we increase her Indocin to twice a day and see if we get her pain down to 2 out of 10 follow-up in 4 weeks  Physical Exam Nursing note reviewed.  Constitutional:      Appearance: Normal appearance.  Musculoskeletal:     Comments: The knee is swollen but no effusion and there is mild tenderness on the lateral joint line   The anterolateral lower leg is tender   Skin:    General: Skin is warm and dry.     Capillary Refill: Capillary refill takes less than 2 seconds.  Neurological:     General: No focal deficit present.     Mental Status: She is alert and oriented to person, place, and time.  Psychiatric:        Mood and Affect: Mood normal.        Thought Content: Thought content normal.      Encounter Diagnosis  Name Primary?   Radiculitis of leg Yes    Refill indocin and increase gabapentin  Meds ordered this encounter  Medications   gabapentin (NEURONTIN) 100 MG capsule    Sig: Two caps tid    Dispense:  90 capsule    Refill:  2   indomethacin (INDOCIN) 25 MG capsule    Sig: Take 1 capsule (25 mg total) by mouth 2 (two) times daily with a meal.    Dispense:  60 capsule    Refill:  0   Fu 4 weeks

## 2022-08-09 ENCOUNTER — Encounter: Payer: Medicare HMO | Admitting: Nutrition

## 2022-08-26 ENCOUNTER — Ambulatory Visit: Payer: Medicare HMO | Admitting: Nutrition

## 2022-08-26 ENCOUNTER — Ambulatory Visit: Payer: Medicare HMO | Admitting: Cardiology

## 2022-08-30 ENCOUNTER — Telehealth: Payer: Self-pay | Admitting: Orthopedic Surgery

## 2022-08-30 NOTE — Telephone Encounter (Signed)
Pt called lvm that she needed to cx her appt for Thursday, 09/02/22, but the appointment is actually on Wednesday, 09/01/22.  I left a message to confirm that she still wants to cancel.  Phone # [phone]

## 2022-09-01 ENCOUNTER — Ambulatory Visit: Payer: Medicare HMO | Admitting: Orthopedic Surgery

## 2022-09-01 DIAGNOSIS — E1165 Type 2 diabetes mellitus with hyperglycemia: Secondary | ICD-10-CM | POA: Diagnosis not present

## 2022-09-01 DIAGNOSIS — E782 Mixed hyperlipidemia: Secondary | ICD-10-CM | POA: Diagnosis not present

## 2022-09-01 DIAGNOSIS — I10 Essential (primary) hypertension: Secondary | ICD-10-CM | POA: Diagnosis not present

## 2022-09-01 DIAGNOSIS — K219 Gastro-esophageal reflux disease without esophagitis: Secondary | ICD-10-CM | POA: Diagnosis not present

## 2022-09-01 DIAGNOSIS — Z1329 Encounter for screening for other suspected endocrine disorder: Secondary | ICD-10-CM | POA: Diagnosis not present

## 2022-09-01 DIAGNOSIS — E7849 Other hyperlipidemia: Secondary | ICD-10-CM | POA: Diagnosis not present

## 2022-09-01 DIAGNOSIS — R5382 Chronic fatigue, unspecified: Secondary | ICD-10-CM | POA: Diagnosis not present

## 2022-09-02 ENCOUNTER — Other Ambulatory Visit: Payer: Self-pay | Admitting: Orthopedic Surgery

## 2022-09-02 DIAGNOSIS — M5410 Radiculopathy, site unspecified: Secondary | ICD-10-CM

## 2022-09-13 DIAGNOSIS — Z1159 Encounter for screening for other viral diseases: Secondary | ICD-10-CM | POA: Diagnosis not present

## 2022-09-14 ENCOUNTER — Other Ambulatory Visit (HOSPITAL_COMMUNITY): Payer: Self-pay | Admitting: Family Medicine

## 2022-09-14 DIAGNOSIS — Z1231 Encounter for screening mammogram for malignant neoplasm of breast: Secondary | ICD-10-CM

## 2022-10-15 ENCOUNTER — Ambulatory Visit: Payer: Medicare HMO | Attending: Cardiology | Admitting: Cardiology

## 2022-10-15 ENCOUNTER — Encounter: Payer: Self-pay | Admitting: Cardiology

## 2022-10-15 VITALS — BP 140/64 | HR 89 | Ht 60.0 in | Wt 134.6 lb

## 2022-10-15 DIAGNOSIS — E782 Mixed hyperlipidemia: Secondary | ICD-10-CM

## 2022-10-15 DIAGNOSIS — I10 Essential (primary) hypertension: Secondary | ICD-10-CM

## 2022-10-15 DIAGNOSIS — R1013 Epigastric pain: Secondary | ICD-10-CM

## 2022-10-15 DIAGNOSIS — I2510 Atherosclerotic heart disease of native coronary artery without angina pectoris: Secondary | ICD-10-CM | POA: Diagnosis not present

## 2022-10-15 NOTE — Patient Instructions (Addendum)
Medication Instructions:  Your physician recommends that you continue on your current medications as directed. Please refer to the Current Medication list given to you today.  Labwork: none  Testing/Procedures: none  Follow-Up: Your physician recommends that you schedule a follow-up appointment in: 6 months  Any Other Special Instructions Will Be Listed Below (If Applicable). Your physician has requested that you regularly monitor and record your blood pressure readings at home. Please use the same machine at the same time of day to check your readings and record them. Call office next week with your home blood pressure readings. You have been referred to Gastroenterology.  If you need a refill on your cardiac medications before your next appointment, please call your pharmacy.

## 2022-10-15 NOTE — Progress Notes (Signed)
Clinical Summary Andrea Herrera is a 71 y.o.female seen today for follow up of the following medical problems.   1. CAD - admit 04/2015 with inferior STEMI, s/p DES x 2 to RCA. LVEF 55-60% by echo   06/2020 echo LVEF 60-65%, indet DDx, normal RV function      04/2022 nuclear stress: no ischemia. - no recent chest pains. Can have some epigastric pain with eating, dysphagia       2. HTN - compliant with meds    3. Hyperlipidemia - she is on atorvastatin 80mg  daily   03/2021 TC 99 TG 60 HDL 56 LDL 31 08/2021 TC 84 TG 77 HDL 46 LDL 22    Past Medical History:  Diagnosis Date   Hypertension    Pre-diabetes      No Known Allergies   Current Outpatient Medications  Medication Sig Dispense Refill   amLODipine (NORVASC) 10 MG tablet Take 1 tablet by mouth once daily 90 tablet 1   aspirin EC 81 MG tablet Take 1 tablet (81 mg total) by mouth daily. 30 tablet 3   atorvastatin (LIPITOR) 80 MG tablet TAKE 1 TABLET BY MOUTH ONCE DAILY AT  6  PM 90 tablet 3   cholecalciferol (VITAMIN D) 1000 UNITS tablet Take 1,000 Units by mouth daily.     Cyanocobalamin (VITAMIN B-12 CR PO) Take 500 mcg by mouth daily.      gabapentin (NEURONTIN) 100 MG capsule Two caps tid 90 capsule 2   glipiZIDE (GLUCOTROL XL) 5 MG 24 hr tablet Take 5 mg by mouth daily.     indomethacin (INDOCIN) 25 MG capsule TAKE 1 CAPSULE BY MOUTH TWICE DAILY WITH A MEAL 60 capsule 0   lisinopril (ZESTRIL) 2.5 MG tablet Take 1 tablet by mouth once daily 90 tablet 3   metFORMIN (GLUCOPHAGE-XR) 500 MG 24 hr tablet Take 500 mg by mouth 2 (two) times daily with a meal.      metoprolol tartrate (LOPRESSOR) 25 MG tablet Take 1 tablet by mouth twice daily 180 tablet 1   Multiple Vitamin (MULTIVITAMIN) tablet Take 1 tablet by mouth daily.     nitroGLYCERIN (NITROSTAT) 0.4 MG SL tablet Place 1 tablet (0.4 mg total) under the tongue every 5 (five) minutes x 3 doses as needed for chest pain. 25 tablet 3   No current  facility-administered medications for this visit.     Past Surgical History:  Procedure Laterality Date   CARDIAC CATHETERIZATION N/A 04/26/2015   Procedure: Left Heart Cath and Coronary Angiography;  Surgeon: 06/27/2015, MD;  Location: [facility] INVASIVE CV LAB;  Service: Cardiovascular;  Laterality: N/A;     No Known Allergies    Family History  Problem Relation Age of Onset   Colon cancer Mother    Lung cancer Father    Stroke Father    Pancreatic cancer Brother    Heart disease Paternal Grandmother    Stroke Paternal Grandmother    Heart disease Paternal Grandfather    Stroke Paternal Grandfather      Social History Ms. Colquitt reports that she quit smoking about 7 years ago. Her smoking use included cigarettes. She started smoking about 35 years ago. She has a 5.50 pack-year smoking history. She has never used smokeless tobacco. Ms. Focht reports no history of alcohol use.   Review of Systems CONSTITUTIONAL: No weight loss, fever, chills, weakness or fatigue.  HEENT: Eyes: No visual loss, blurred vision, double vision or yellow sclerae.No hearing loss,  sneezing, congestion, runny nose or sore throat.  SKIN: No rash or itching.  CARDIOVASCULAR: per hpi RESPIRATORY: No shortness of breath, cough or sputum.  GASTROINTESTINAL: No anorexia, nausea, vomiting or diarrhea. No abdominal pain or blood.  GENITOURINARY: No burning on urination, no polyuria NEUROLOGICAL: No headache, dizziness, syncope, paralysis, ataxia, numbness or tingling in the extremities. No change in bowel or bladder control.  MUSCULOSKELETAL: No muscle, back pain, joint pain or stiffness.  LYMPHATICS: No enlarged nodes. No history of splenectomy.  PSYCHIATRIC: No history of depression or anxiety.  ENDOCRINOLOGIC: No reports of sweating, cold or heat intolerance. No polyuria or polydipsia.  Marland Kitchen   Physical Examination Today's Vitals   10/15/22 1501  BP: (!) 140/64  Pulse: 89  SpO2: 96%  Weight: 134 lb  9.6 oz (61.1 kg)  Height: 5' (1.524 m)   Body mass index is 26.29 kg/m.  Gen: resting comfortably, no acute distress HEENT: no scleral icterus, pupils equal round and reactive, no palptable cervical adenopathy,  CV: RRR, no m/r/g no jvd Resp: Clear to auscultation bilaterally GI: abdomen is soft, non-tender, non-distended, normal bowel sounds, no hepatosplenomegaly MSK: extremities are warm, no edema.  Skin: warm, no rash Neuro:  no focal deficits Psych: appropriate affect   Diagnostic Studies 04/2015 Cath Mid RCA lesion, 99% stenosed. There is a 0% residual stenosis post intervention. Prox RCA to Mid RCA lesion, 75% stenosed. There is a 0% residual stenosis post intervention. The left ventricular systolic function is normal.       04/2015 Echo - Left ventricle: The cavity size was normal. Systolic function was   normal. The estimated ejection fraction was in the range of 55%   to 60%. Wall motion was normal; there were no regional wall   motion abnormalities. Doppler parameters are consistent with   abnormal left ventricular relaxation (grade 1 diastolic   dysfunction).   09/2015 PFTs No COPD   05/2019 ABIs Summary:  Right: Resting right ankle-brachial index is within normal range. No  evidence of significant right lower extremity arterial disease. The right  toe-brachial index is normal.   Left: Resting left ankle-brachial index is within normal range. No  evidence of significant left lower extremity arterial disease. The left  toe-brachial index is normal.    06/2020 echo IMPRESSIONS     1. Left ventricular ejection fraction, by estimation, is 60 to 65%. The  left ventricle has normal function. The left ventricle has no regional  wall motion abnormalities. Left ventricular diastolic parameters are  indeterminate.   2. Right ventricular systolic function is normal. The right ventricular  size is normal. There is normal pulmonary artery systolic pressure. The   estimated right ventricular systolic pressure is 23.4 mmHg.   3. The mitral valve is grossly normal. Trivial mitral valve  regurgitation.   4. The aortic valve is tricuspid. Aortic valve regurgitation is not  visualized. Mild aortic valve sclerosis is present, with no evidence of  aortic valve stenosis.   5. The inferior vena cava is normal in size with greater than 50%  respiratory variability, suggesting right atrial pressure of 3 mmHg.     Assessment and Plan  1.CAD - no recent symptoms, recent stress test was benign - continue current meds  2. Epigastric pain/dysphagia - refer to GI  3. HTN - above goal, she will call with updated home bp's next week. Room to increase lisinopril to 5mg  if needed  4.Hyperlipidemia - at goal, continue current meds   ,  M.D. 

## 2022-10-21 ENCOUNTER — Encounter: Payer: Self-pay | Admitting: Gastroenterology

## 2022-10-26 ENCOUNTER — Ambulatory Visit: Payer: Medicare HMO | Admitting: Nutrition

## 2022-10-26 ENCOUNTER — Telehealth: Payer: Self-pay | Admitting: Nutrition
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
## 2022-10-29 DIAGNOSIS — F17210 Nicotine dependence, cigarettes, uncomplicated: Secondary | ICD-10-CM | POA: Diagnosis not present

## 2022-10-29 DIAGNOSIS — Z6826 Body mass index (BMI) 26.0-26.9, adult: Secondary | ICD-10-CM | POA: Diagnosis not present

## 2022-10-29 DIAGNOSIS — I10 Essential (primary) hypertension: Secondary | ICD-10-CM | POA: Diagnosis not present

## 2022-10-29 DIAGNOSIS — E1165 Type 2 diabetes mellitus with hyperglycemia: Secondary | ICD-10-CM | POA: Diagnosis not present

## 2022-10-29 DIAGNOSIS — Z0000 Encounter for general adult medical examination without abnormal findings: Secondary | ICD-10-CM | POA: Diagnosis not present

## 2022-11-11 ENCOUNTER — Encounter: Payer: Medicare HMO | Attending: Family Medicine | Admitting: Nutrition

## 2022-11-11 ENCOUNTER — Telehealth: Payer: Self-pay | Admitting: Nutrition

## 2022-11-11 VITALS — Ht 60.0 in | Wt 135.0 lb

## 2022-11-11 DIAGNOSIS — I10 Essential (primary) hypertension: Secondary | ICD-10-CM | POA: Diagnosis not present

## 2022-11-11 DIAGNOSIS — E785 Hyperlipidemia, unspecified: Secondary | ICD-10-CM | POA: Insufficient documentation

## 2022-11-11 DIAGNOSIS — E118 Type 2 diabetes mellitus with unspecified complications: Secondary | ICD-10-CM | POA: Insufficient documentation

## 2022-11-11 DIAGNOSIS — I252 Old myocardial infarction: Secondary | ICD-10-CM | POA: Insufficient documentation

## 2022-11-11 NOTE — Telephone Encounter (Signed)
VM reminder left

## 2022-11-11 NOTE — Progress Notes (Signed)
Medical Nutrition Therapy Dm Follow up Appointment Start time:  1610 Appointment End time:  1600  Primary concerns today: DM Type 2  Referral diagnosis: E11.8 Preferred learning style: No preference. Learning readiness: Ready   NUTRITION ASSESSMENT  DM Follow up Changes made:   Not testing blood sugars. She does report reading food labels.  A1C 8.3%. She admits she needs to get back to doing better, taking her meals to work and plan better meals at home. She also realizes she needs to test blood sugars.   Anthropometrics  Wt Readings from Last 3 Encounters:  10/15/22 134 lb 9.6 oz (61.1 kg)  05/11/22 135 lb (61.2 kg)  05/06/22 136 lb (61.7 kg)   Ht Readings from Last 3 Encounters:  10/15/22 5' (1.524 m)  05/11/22 5' (1.524 m)  05/06/22 5' (1.524 m)   There is no height or weight on file to calculate BMI. @BMIFA @ Facility age limit for growth %iles is 20 years. Facility age limit for growth %iles is 20 years.    Clinical Medical Hx: HA 2015, Type 2 DM Medications: Metformin 500 mg BID, Glipizide 5 mg a day. Had been increased Linsinopril to 40 mg by cardiology   Labs:  Lab Results  Component Value Date   HGBA1C 6.7 (H) 04/27/2015      Latest Ref Rng & Units 03/23/2021    9:50 AM 04/26/2015    8:46 AM 04/26/2015    3:57 AM  CMP  Glucose 70 - 99 mg/dL 310  206  237   BUN 8 - 23 mg/dL 7  <5  5   Creatinine 0.44 - 1.00 mg/dL 0.60  0.65  0.76   Sodium 135 - 145 mmol/L 137  138  139   Potassium 3.5 - 5.1 mmol/L 4.3  3.7  3.9   Chloride 98 - 111 mmol/L 101  103  103   CO2 22 - 32 mmol/L 27  26  26    Calcium 8.9 - 10.3 mg/dL 9.6  9.4  9.5   Total Protein 6.5 - 8.1 g/dL   7.7   Total Bilirubin 0.3 - 1.2 mg/dL   0.4   Alkaline Phos 38 - 126 U/L   111   AST 15 - 41 U/L   41   ALT 14 - 54 U/L   20     Notable Signs/Symptoms: Blurry vision  Lifestyle & Dietary Hx Lives with her son. Works PT for [employer] in [place].  Estimated daily fluid intake: 40 oz Supplements:  MVI, VIt D Sleep: 8/9 hrs  Stress / self-care: none Current average weekly physical activity: Cardiac rehab exercises daily.    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   NUTRITION INTERVENTION  Nutrition education (E-1) on the following topics:  Nutrition and Diabetes education provided on My Plate, CHO counting, meal planning, portion sizes, timing of meals, avoiding snacks between meals unless having a low blood sugar, target ranges for A1C and blood sugars, signs/symptoms and treatment of hyper/hypoglycemia, monitoring blood sugars, taking medications as prescribed, benefits of exercising 30 minutes per day and prevention of complications of DM. Low Cholesterol, high fiber diet  Handouts Provided Include  Plant  based plate method   Learning Style & Readiness for Change Teaching method utilized: Visual & Auditory  Demonstrated degree of understanding via: Teach Back  Barriers to learning/adherence to lifestyle change: none  Goals Established by Pt  Check blood sugar before breakfast and before bid Take Metformin after breakfast  Increase low carb vegetables Don't skip meals  Call MD if BS are staying in 200's  The following Lifestyle Medicine recommendations according to [place] of Lifestyle Medicine  [facility]) were discussed and and offered to patient and she  agrees to start the journey:  A. Whole Foods, Plant-Based Nutrition comprising of fruits and vegetables, plant-based proteins, whole-grain carbohydrates was discussed in detail with the patient.   A list for source of those nutrients were also provided to the  patient.  Patient will use only water or unsweetened tea for hydration. B.  The need to stay away from risky substances including alcohol, smoking; obtaining 7 to 9 hours of restorative sleep, at least 150 minutes of moderate intensity exercise weekly, the importance of healthy social connections,  and stress management techniques were discussed. C.  A full color page of  Calorie density of various food groups per pound showing examples of each food groups was provided to the patient.  MONITORING & EVALUATION Dietary intake, weekly physical activity, and weight/blood sugars in 1 month.  Next Steps  Patient is to work on meal planning and choosing more plant based foods.Marland Kitchen

## 2022-11-11 NOTE — Patient Instructions (Signed)
Goals   Test blood sugars twice a day; before breakfast and before dinner Increase more plant based foods Get A1C down to 7%

## 2022-11-16 ENCOUNTER — Encounter: Payer: Self-pay | Admitting: Nutrition

## 2022-11-17 ENCOUNTER — Ambulatory Visit (HOSPITAL_COMMUNITY)
Admission: RE | Admit: 2022-11-17 | Discharge: 2022-11-17 | Disposition: A | Discharge status: Home / Self Care | Payer: Medicare HMO | Source: Ambulatory Visit | Attending: Family Medicine | Admitting: Family Medicine

## 2022-11-17 DIAGNOSIS — Z1231 Encounter for screening mammogram for malignant neoplasm of breast: Secondary | ICD-10-CM

## 2022-11-23 ENCOUNTER — Telehealth: Payer: Self-pay | Admitting: Cardiology

## 2022-11-23 ENCOUNTER — Encounter: Payer: Self-pay | Admitting: Gastroenterology

## 2022-11-23 ENCOUNTER — Ambulatory Visit (INDEPENDENT_AMBULATORY_CARE_PROVIDER_SITE_OTHER): Payer: Medicare HMO | Admitting: Gastroenterology

## 2022-11-23 VITALS — BP 151/82 | HR 82 | Temp 97.5°F | Ht 60.0 in | Wt 135.3 lb

## 2022-11-23 DIAGNOSIS — R1310 Dysphagia, unspecified: Secondary | ICD-10-CM

## 2022-11-23 DIAGNOSIS — R5383 Other fatigue: Secondary | ICD-10-CM

## 2022-11-23 DIAGNOSIS — K5900 Constipation, unspecified: Secondary | ICD-10-CM | POA: Diagnosis not present

## 2022-11-23 DIAGNOSIS — Z86010 Personal history of colonic polyps: Secondary | ICD-10-CM | POA: Diagnosis not present

## 2022-11-23 DIAGNOSIS — K219 Gastro-esophageal reflux disease without esophagitis: Secondary | ICD-10-CM

## 2022-11-23 MED ORDER — PANTOPRAZOLE SODIUM 40 MG PO TBEC: 40.0000 mg | DELAYED_RELEASE_TABLET | Freq: Every day | ORAL | 1 refills | Status: DC

## 2022-11-23 NOTE — Telephone Encounter (Signed)
Pt stated her BP is still running high. She just had it checked at her visit today.

## 2022-11-23 NOTE — Patient Instructions (Addendum)
Start pantoprazole 40 mg once daily, 30 minutes prior to breakfast. For swallowing you should alternate small bites with sips of liquids.    Follow a GERD diet:  Avoid fried, fatty, greasy, spicy, citrus foods, tomato based foods. Avoid caffeine and carbonated beverages. Avoid chocolate. Try eating 4-6 small meals a day rather than 3 large meals. Do not eat within 3 hours of laying down. Prop head of bed up on wood or bricks to create a 6 inch incline.  We are scheduling you for a colonoscopy and upper endoscopy with dilation in the near future with Dr. Gala Romney.  Start colace (docusate sodium) 1 tablet daily and also may use 1 capful of miralax nightly. (Polyethylene glycol)  We are checking your iron and hemoglobin to see if this is cause of your fatigue.   It was a pleasure to see you today. I want to create trusting relationships with patients. If you receive a survey regarding your visit,  I greatly appreciate you taking time to fill this out on paper or through your MyChart. I value your feedback.  Venetia Night, MSN, FNP-BC, AGACNP-BC [facility]

## 2022-11-23 NOTE — Progress Notes (Unsigned)
GI Office Note    Referring Provider: Lawerance Sabal, [place] Primary Care Physician:  Lawerance Sabal, [place]  Primary Gastroenterologist: Gerrit Friends.Rourk, MD   Chief Complaint   Chief Complaint  Patient presents with   Abdominal Pain    Patient here today due to epigastric discomfort. Patient says it is hard to swallow certain types of foods and pills. Patient says she has some burning sensation in her stomach after certain foods. She is not on any ppi, she says she takes mylanta prn.     History of Present Illness   Andrea Herrera is a 72 y.o. female presenting today at the request of Lawerance Sabal, [place] for epigastric abdominal pain.   Patient reports difficulty swallowing certain foods and pills.  Also has burning sensation to her stomach after eating certain foods.  Has been taking Mylanta as needed.  Has been having issues with swallowing and feeling like her food stop in her lower chest between her chest. Was having shortness of breath but has been evaluated by cardiology and had EKG/stress test that was normal. Mylanta gives her some relief but not on daily basis. Has burning sensation to her stomach with certain foods (sausage, spaghetti sauce, pizza, and some meats). No nausea or vomiting. Had difficulty with [place] swallowing at thanksgiving. Has been getting fuller quicker. .Has nasty burps and reports lots of saliva back up in her mouth.   Thinks she may have had an EGD before in [place] many years ago. Unsure time frame. Also had colonoscopy there as well - has had polyps in the past. Repots its been 10-12 years since she had one of those.   Also having constipation. Will take a laxative (wellness orange pill) every 2 weeks or once per month and stools are hard. May have a BM once every 3 days or so and she is straining. Tried senna and that did not work. Has not tried miralax.   Fatigue and shortness of breath. Shortness of breath is usually with talking, singing, or with  exertion.  Denies any melena or BRBPR.  MI in 2015 s/p 2 stents placed. Does not take NSAIDs.    Current Outpatient Medications  Medication Sig Dispense Refill   amLODipine (NORVASC) 10 MG tablet Take 1 tablet by mouth once daily 90 tablet 1   aspirin EC 81 MG tablet Take 1 tablet (81 mg total) by mouth daily. 30 tablet 3   atorvastatin (LIPITOR) 80 MG tablet TAKE 1 TABLET BY MOUTH ONCE DAILY AT  6  PM 90 tablet 3   CALCIUM PO Take by mouth daily.     cholecalciferol (VITAMIN D) 1000 UNITS tablet Take 1,000 Units by mouth daily.     gabapentin (NEURONTIN) 100 MG capsule Two caps tid 90 capsule 2   glipiZIDE (GLUCOTROL XL) 5 MG 24 hr tablet Take 5 mg by mouth daily.     indomethacin (INDOCIN) 25 MG capsule TAKE 1 CAPSULE BY MOUTH TWICE DAILY WITH A MEAL 60 capsule 0   lisinopril (ZESTRIL) 2.5 MG tablet Take 1 tablet by mouth once daily 90 tablet 3   metFORMIN (GLUCOPHAGE-XR) 500 MG 24 hr tablet Take 500 mg by mouth 2 (two) times daily with a meal.      metoprolol tartrate (LOPRESSOR) 25 MG tablet Take 1 tablet by mouth twice daily 180 tablet 1   Multiple Vitamin (MULTIVITAMIN) tablet Take 1 tablet by mouth daily.     nitroGLYCERIN (NITROSTAT) 0.4 MG SL tablet Place 1 tablet (  0.4 mg total) under the tongue every 5 (five) minutes x 3 doses as needed for chest pain. 25 tablet 3   Omega-3 Fatty Acids (FISH OIL) 1000 MG CAPS Take by mouth. Every other day per patient.     pantoprazole (PROTONIX) 40 MG tablet Take 1 tablet (40 mg total) by mouth daily. 30 tablet 1   POTASSIUM PO Take 99 mg by mouth daily. OTC K+     No current facility-administered medications for this visit.    Past Medical History:  Diagnosis Date   Hypertension    Pre-diabetes     Past Surgical History:  Procedure Laterality Date   CARDIAC CATHETERIZATION N/A 04/26/2015   Procedure: Left Heart Cath and Coronary Angiography;  Surgeon: Lorretta Harp, MD;  Location: [place] CV LAB;  Service: Cardiovascular;   Laterality: N/A;    Family History  Problem Relation Age of Onset   Colon cancer Mother    Lung cancer Father    Stroke Father    Pancreatic cancer Brother    Heart disease Paternal Grandmother    Stroke Paternal Grandmother    Heart disease Paternal Grandfather    Stroke Paternal Grandfather     Allergies as of 11/23/2022   (No Known Allergies)    Social History   Socioeconomic History   Marital status: Divorced    Spouse name: Not on file   Number of children: Not on file   Years of education: Not on file   Highest education level: Not on file  Occupational History   Not on file  Tobacco Use   Smoking status: Former    Packs/day: 0.25    Years: 22.00    Total pack years: 5.50    Types: Cigarettes    Start date: 09/11/1987    Quit date: 04/21/2015    Years since quitting: 7.5   Smokeless tobacco: Never  Vaping Use   Vaping Use: Never used  Substance and Sexual Activity   Alcohol use: No    Alcohol/week: 0.0 standard drinks of alcohol   Drug use: No   Sexual activity: Not on file  Other Topics Concern   Not on file  Social History Narrative   Not on file   Social Determinants of Health   Financial Resource Strain: Not on file  Food Insecurity: Not on file  Transportation Needs: Not on file  Physical Activity: Not on file  Stress: Not on file  Social Connections: Not on file  Intimate Partner Violence: Not on file     Review of Systems   Gen: + fatigue. Denies any fever, chills, weight loss, lack of appetite.  CV: Denies chest pain, heart palpitations, peripheral edema, syncope.  Resp: + shortness of breath with exertion. Denies wheezing or cough.  GI: see HPI GU : Denies urinary burning, urinary frequency, urinary hesitancy MS: Denies joint pain, muscle weakness, cramps, or limitation of movement.  Derm: Denies rash, itching, dry skin Psych: Denies depression, anxiety, memory loss, and confusion Heme: Denies bruising, bleeding, and enlarged lymph  nodes.   Physical Exam   BP (!) 151/82 (BP Location: Left Arm, Patient Position: Sitting, Cuff Size: Large)   Pulse 82   Temp (!) 97.5 F (36.4 C) (Temporal)   Ht 5' (1.524 m)   Wt 135 lb 4.8 oz (61.4 kg)   BMI 26.42 kg/m   General:   Alert and oriented. Pleasant and cooperative. Well-nourished and well-developed.  Head:  Normocephalic and atraumatic. Eyes:  Without icterus, sclera  clear and conjunctiva pink.  Ears:  Normal auditory acuity. Mouth:  No deformity or lesions, oral mucosa pink.  Lungs:  Clear to auscultation bilaterally. No wheezes, rales, or rhonchi. No distress.  Heart:  S1, S2 present without murmurs appreciated.  Abdomen:  +BS, soft, non-tender and non-distended. No HSM noted. No guarding or rebound. No masses appreciated.  Rectal:  Deferred Msk:  Symmetrical without gross deformities. Normal posture. Extremities:  Without edema. Neurologic:  Alert and  oriented x4;  grossly normal neurologically. Skin:  Intact without significant lesions or rashes. Psych:  Alert and cooperative. Normal mood and affect.   Assessment   Andrea Herrera is a 73 y.o. female with a history of HTN, MI s/p stent placement in 2015, diabetes presenting today with epigastric pain, dysphagia, GERD, constipation, and need for surveillance colonoscopy.  GERD, epigastric pain, dysphagia: Patient with complaints of difficulty swallowing certain foods and feels like it is stuck in her lower chest.  Also with epigastric pain/burning sensation after eating certain foods such as tomato-based products and certain meats. Taking Mylanta as needed.  Denies any nausea or vomiting.  On Thanksgiving had trouble swallowing her therapy.  Also with intermittent sour burps.  Possible remote EGD in [place] many years prior.  Will request his records.  Reported recent evaluation with cardiology, stress test reviewed and reported as normal with no evidence of ischemia or infarction that was completed in July 2023.   Patient currently denies any chest pain.  Denies any NSAID use.  Only takes baby aspirin.  Advised to start pantoprazole 40 mg once daily, 30 minutes prior to breakfast.  GERD diet/lifestyle modifications addressed.  Constipation: Titralac doses once or twice per month.  Usually has a BM every 3 days however does report frequent straining.  Has tried senna in the past and reports no improvement with this.  Has never tried MiraLAX.  Stools reported as hard.  Advised to start Colace 1 tablet daily as well as MiraLAX 17 g once nightly.  Hypertension: The patient was found to have elevated blood pressure when vital signs were checked in the office. The blood pressure was rechecked by the nursing staff and it was found be persistently elevated >140/90 mmHg. I personally advised to the patient to follow up closely with PCP for hypertension control. Following with cardiology for BP as well.   Fatigue: Has been experiencing some fatigue and slight worsening shortness of breath usually with exertion or frequent talking/singing.  No dyspnea at rest.  Denies any syncope or lightheadedness.  Also denies any melena or BRBPR.  Given the symptoms we will check iron panel and CBC to assess for any anemia.  History of colon polyps: Reports last colonoscopy also completed at [facility], was performed at least 10 to 12 years prior.  Does note a history of polyps.  Will request these records and proceed with scheduling surveillance colonoscopy in the near future.  PLAN   Colace 1 tablet daily Miralax 17 g once daily.  Proceed with EGD with dilation and colonoscopy with propofol by Dr. Gala Romney in near future: the risks, benefits, and alternatives have been discussed with the patient in detail. The patient states understanding and desires to proceed. ASA 3 Hold glipizide morning of procedure Hold metformin night prior and morning of. Extra half day of clears Peg based prep Start pantoprazole 40 mg daily, 30 minutes prior  to breakfast.  GERD diet/lifestyle modifications.  Follow-up with PCP regarding blood pressure management. Iron and CBC  Request  records from [facility].  Follow up in 3 months.    Venetia Night, MSN, FNP-BC, AGACNP-BC [facility]

## 2022-11-24 ENCOUNTER — Encounter: Payer: Self-pay | Admitting: Gastroenterology

## 2022-11-24 ENCOUNTER — Ambulatory Visit: Payer: Medicare HMO | Admitting: Gastroenterology

## 2022-11-24 DIAGNOSIS — E119 Type 2 diabetes mellitus without complications: Secondary | ICD-10-CM | POA: Diagnosis not present

## 2022-11-24 DIAGNOSIS — R5383 Other fatigue: Secondary | ICD-10-CM | POA: Diagnosis not present

## 2022-11-24 NOTE — Telephone Encounter (Signed)
Says home blood pressure readings have been high and she forgot to send them to the office. Gave appointment to see [facility] on 11/29/2021 @4 :00 pm. Advised to bring home BP cuff to visit. Verbalized understanding of plan.

## 2022-11-25 LAB — CBC
HCT: 34.9 % — ABNORMAL LOW (ref 35.0–45.0)
Hemoglobin: 11.7 g/dL (ref 11.7–15.5)
MCH: 31.7 pg (ref 27.0–33.0)
MCHC: 33.5 g/dL (ref 32.0–36.0)
MCV: 94.6 fL (ref 80.0–100.0)
MPV: 10.8 fL (ref 7.5–12.5)
Platelets: 281 10*3/uL (ref 140–400)
RBC: 3.69 10*6/uL — ABNORMAL LOW (ref 3.80–5.10)
RDW: 11.2 % (ref 11.0–15.0)
WBC: 5.8 10*3/uL (ref 3.8–10.8)

## 2022-11-25 LAB — IRON,TIBC AND FERRITIN PANEL
%SAT: 26 % (calc) (ref 16–45)
Ferritin: 46 ng/mL (ref 16–288)
Iron: 76 ug/dL (ref 45–160)
TIBC: 296 mcg/dL (calc) (ref 250–450)

## 2022-11-29 ENCOUNTER — Ambulatory Visit: Payer: Medicare HMO | Attending: Nurse Practitioner | Admitting: Nurse Practitioner

## 2022-11-29 ENCOUNTER — Encounter: Payer: Self-pay | Admitting: Nurse Practitioner

## 2022-11-29 VITALS — BP 135/73 | HR 83 | Ht 60.0 in | Wt 133.2 lb

## 2022-11-29 DIAGNOSIS — I10 Essential (primary) hypertension: Secondary | ICD-10-CM | POA: Diagnosis not present

## 2022-11-29 DIAGNOSIS — Z79899 Other long term (current) drug therapy: Secondary | ICD-10-CM

## 2022-11-29 DIAGNOSIS — I2510 Atherosclerotic heart disease of native coronary artery without angina pectoris: Secondary | ICD-10-CM

## 2022-11-29 DIAGNOSIS — E785 Hyperlipidemia, unspecified: Secondary | ICD-10-CM

## 2022-11-29 DIAGNOSIS — R7303 Prediabetes: Secondary | ICD-10-CM

## 2022-11-29 MED ORDER — LISINOPRIL 5 MG PO TABS: 5.0000 mg | ORAL_TABLET | Freq: Every day | ORAL | 6 refills | Status: DC

## 2022-11-29 NOTE — Patient Instructions (Signed)
Medication Instructions:  Increase Lisinopril to 45m daily  Continue all other medications.     Labwork: BMET - order given today  Please do in 2 weeks  Office will contact with results via phone, letter or mychart.     Testing/Procedures: none  Follow-Up: Keep Dr. BHarl Bowieas scheduled    Any Other Special Instructions Will Be Listed Below (If Applicable). [facility] BP log Nurse visit for BP check in 6-8 weeks   If you need a refill on your cardiac medications before your next appointment, please call your pharmacy.

## 2022-11-29 NOTE — Progress Notes (Unsigned)
Cardiology Office Note:    Date:  11/29/2022  ID:  Andrea Herrera, DOB Mar 14, 1951, MRN [id number]  PCP:  Denny Levy, [place] [facility] Cardiologist:  Carlyle Dolly, MD     Referring MD: Denny Levy, [place]   CC: Elevated BP   History of Present Illness:    Andrea Herrera is a delightful 72 y.o. female with a hx of the following:   CAD, s/p inferior STEMI HLD HTN Pre-diabetes  Previous hx of admission in 2016 for inferior STEMI. Received 2 DES to RCA. EF was normal. 2021 Echo showed normal EF, indet Ddx. NST in 04/2022 was negative for ischemia.   Last seen by Dr. Carlyle Dolly 09/2022.  Was doing well at the time.  Was compliant with her medications.  Patient did admit to epigastric pain/dysphagia, was referred to GI.  Blood pressure was above goal, she was instructed to call with updated home BP's within the following week. Was recommended to consider increasing Lisinopril to 5 mg daily if needed. Was told to follow-up in 6 months.   She presents today with a CC of elevated blood pressure. She was at appt last week and found her BP to be 151/83. Overall doing well from a cardiac perspective. Does admit to occasional caffeine consumption and sodas. Denies any chest pain, shortness of breath, palpitations, syncope, presyncope, dizziness, orthopnea, PND, swelling or significant weight changes, acute bleeding, or claudication. Compliant with her meds and tolerating well. She brings in her BP cuff from home. Chart has been updated.   SH: Retired Radio producer, now works as Quarry manager.   Past Medical History:  Diagnosis Date   CAD (coronary artery disease)    Hx of admission in 2016 for inferior STEMI. Received 2 DES to RCA. EF was normal.   HLD (hyperlipidemia)    Hypertension    Pre-diabetes     Past Surgical History:  Procedure Laterality Date   CARDIAC CATHETERIZATION N/A 04/26/2015   Procedure: Left Heart Cath and Coronary Angiography;  Surgeon: Lorretta Harp, MD;  Location: [facility];  Service: Cardiovascular;  Laterality: N/A;    Current Medications: Current Meds  Medication Sig   amLODipine (NORVASC) 10 MG tablet Take 1 tablet by mouth once daily   aspirin EC 81 MG tablet Take 1 tablet (81 mg total) by mouth daily.   atorvastatin (LIPITOR) 80 MG tablet TAKE 1 TABLET BY MOUTH ONCE DAILY AT  6  PM   CALCIUM PO Take by mouth daily.   cholecalciferol (VITAMIN D) 1000 UNITS tablet Take 1,000 Units by mouth daily.   gabapentin (NEURONTIN) 100 MG capsule Take 200 mg by mouth 2 (two) times daily.   glipiZIDE (GLUCOTROL XL) 5 MG 24 hr tablet Take 5 mg by mouth daily.   indomethacin (INDOCIN) 25 MG capsule Take 25 mg by mouth as needed.   metFORMIN (GLUCOPHAGE-XR) 500 MG 24 hr tablet Take 500 mg by mouth 2 (two) times daily with a meal.    metoprolol tartrate (LOPRESSOR) 25 MG tablet Take 1 tablet by mouth twice daily   Multiple Vitamin (MULTIVITAMIN) tablet Take 1 tablet by mouth daily.   nitroGLYCERIN (NITROSTAT) 0.4 MG SL tablet Place 1 tablet (0.4 mg total) under the tongue every 5 (five) minutes x 3 doses as needed for chest pain.   Omega-3 Fatty Acids (FISH OIL) 1000 MG CAPS Take by mouth. Every other day per patient.   pantoprazole (PROTONIX) 40 MG tablet Take 1 tablet (40  mg total) by mouth daily.   POTASSIUM PO Take 99 mg by mouth daily. OTC K+    lisinopril (ZESTRIL) 2.5 MG tablet Take 1 tablet by mouth once daily     Allergies:   Patient has no known allergies.   Social History   Socioeconomic History   Marital status: Divorced    Spouse name: Not on file   Number of children: Not on file   Years of education: Not on file   Highest education level: Not on file  Occupational History   Not on file  Tobacco Use   Smoking status: Former    Packs/day: 0.25    Years: 22.00    Total pack years: 5.50    Types: Cigarettes    Start date: 09/11/1987    Quit date: 04/21/2015    Years since quitting: 7.6   Smokeless  tobacco: Never  Vaping Use   Vaping Use: Never used  Substance and Sexual Activity   Alcohol use: No    Alcohol/week: 0.0 standard drinks of alcohol   Drug use: No   Sexual activity: Not on file  Other Topics Concern   Not on file  Social History Narrative   Not on file   Social Determinants of Health   Financial Resource Strain: Not on file  Food Insecurity: Not on file  Transportation Needs: Not on file  Physical Activity: Not on file  Stress: Not on file  Social Connections: Not on file     Family History: The patient's family history includes Colon cancer in her mother; Heart disease in her paternal grandfather and paternal grandmother; Lung cancer in her father; Pancreatic cancer in her brother; Stroke in her father, paternal grandfather, and paternal grandmother.  ROS:   Please see the history of present illness.     All other systems reviewed and are negative.  EKGs/Labs/Other Studies Reviewed:    The following studies were reviewed today:   EKG:  EKG is not  ordered today.    Lexiscan on 05/13/2022:   The study is normal. The study is low risk.   No ST deviation was noted.   LV perfusion is normal. There is no evidence of ischemia. There is no evidence of infarction.   Left ventricular function is normal. End diastolic cavity size is normal. End systolic cavity size is normal.  Echocardiogram on 07/10/2020: 1. Left ventricular ejection fraction, by estimation, is 60 to 65%. The  left ventricle has normal function. The left ventricle has no regional  wall motion abnormalities. Left ventricular diastolic parameters are  indeterminate.   2. Right ventricular systolic function is normal. The right ventricular  size is normal. There is normal pulmonary artery systolic pressure. The  estimated right ventricular systolic pressure is 0000000 mmHg.   3. The mitral valve is grossly normal. Trivial mitral valve  regurgitation.   4. The aortic valve is tricuspid. Aortic  valve regurgitation is not  visualized. Mild aortic valve sclerosis is present, with no evidence of  aortic valve stenosis.   5. The inferior vena cava is normal in size with greater than 50%  respiratory variability, suggesting right atrial pressure of 3 mmHg.  Left heart cath on 04/26/2015: Mid RCA lesion, 99% stenosed. There is a 0% residual stenosis post intervention. Prox RCA to Mid RCA lesion, 75% stenosed. There is a 0% residual stenosis post intervention. The left ventricular systolic function is normal.  :Mrs. Gurecki had an acute inferior ST segment elevation myocardial infarction with a  high-grade 99% distal dominant RCA stenosis with TIMI 2 flow. She had successful placement of 2 overlapping drug-eluting stents in the mid to distal and proximal to mid RCA. She had minimal disease in her left system and had normal LV function. She was given weight based bivalirudin bolus and infusion along with Brilenta 180 mg by mouth load. The procedure was done radially. The sheath was removed and a TR band was placed on the right wrist which is patent hemostasis. The patient left the laboratory stable condition. She will be "fast track" and treated with beta blocker, ACE inhibitor, statin drug and an type of therapy for at least 1 year. Cardiac risk factor modification including smoking cessation will be reinforced.   Recent Labs: 11/24/2022: Hemoglobin 11.7; Platelets 281  Recent Lipid Panel    Component Value Date/Time   CHOL 99 03/23/2021 0950   TRIG 60 03/23/2021 0950   HDL 56 03/23/2021 0950   CHOLHDL 1.8 03/23/2021 0950   VLDL 12 03/23/2021 0950   LDLCALC 31 03/23/2021 0950    Physical Exam:    VS:  BP 135/73 (BP Location: Left Arm, Patient Position: Sitting, Cuff Size: Normal)   Pulse 83   Ht 5' (1.524 m)   Wt 133 lb 3.2 oz (60.4 kg)   SpO2 96%   BMI 26.01 kg/m     Wt Readings from Last 3 Encounters:  11/29/22 133 lb 3.2 oz (60.4 kg)  11/23/22 135 lb 4.8 oz (61.4 kg)  11/11/22  135 lb (61.2 kg)     GEN: Well nourished, well developed in no acute distress HEENT: Normal NECK: No JVD; No carotid bruits CARDIAC: S1/S2, RRR, no murmurs, rubs, gallops; 2+ pulses RESPIRATORY:  Clear to auscultation without rales, wheezing or rhonchi  MUSCULOSKELETAL:  No edema; No deformity  SKIN: Warm and dry NEUROLOGIC:  Alert and oriented x 3 PSYCHIATRIC:  Normal affect   ASSESSMENT:    1. Coronary artery disease involving native coronary artery of native heart without angina pectoris   2. Essential hypertension   3. Medication management   4. Hyperlipidemia, unspecified hyperlipidemia type   5. Prediabetes    PLAN:    In order of problems listed above:  CAD, s/p inferior STEMI and DES x 2 RCA in 2016 Stable with no anginal symptoms. No indication for ischemic evaluation. Continue ASA, atorvastatin, lisinopril, Lopressor, and NG PRN. Heart healthy diet and regular cardiovascular exercise encouraged.   2. HTN, medication management BP on arrival, 142/70. Repeat BP [id number] systolic. Home BP showed SBP 150. Discussed appropriate way to check BP. Recommended OMRON cuff. Will increase Lisinopril to 5 mg daily. Will recheck BMET in 2 weeks. Continue Amlodipine and Lopressor. Discussed to monitor BP at home at least 2 hours after medications and sitting for 5-10 minutes. Heart healthy diet and regular cardiovascular exercise encouraged.   3. HLD LDL 1 year ago 31. Continue atorvastatin. Heart healthy diet and regular cardiovascular exercise encouraged.   4. Prediabetes Labs managed by PCP. Heart healthy diet and regular cardiovascular exercise encouraged. Continue to follow with PCP.  5. Disposition: Follow-up with Dr. Carlyle Dolly as scheduled.    Medication Adjustments/Labs and Tests Ordered: Current medicines are reviewed at length with the patient today.  Concerns regarding medicines are outlined above.  Orders Placed This Encounter  Procedures   Basic metabolic panel    Meds ordered this encounter  Medications   lisinopril (ZESTRIL) 5 MG tablet    Sig: Take 1 tablet (5 mg total) by  mouth daily.    Dispense:  30 tablet    Refill:  6    Dose increased 11/29/2022    Patient Instructions  Medication Instructions:  Increase Lisinopril to 74m daily  Continue all other medications.     Labwork: BMET - order given today  Please do in 2 weeks  Office will contact with results via phone, letter or mychart.     Testing/Procedures: none  Follow-Up: Keep Dr. BHarl Bowieas scheduled    Any Other Special Instructions Will Be Listed Below (If Applicable). [facility] BP log Nurse visit for BP check in 6-8 weeks   If you need a refill on your cardiac medications before your next appointment, please call your pharmacy.    Signed, EFinis Bud NP  12/01/2022 3:14 PM    [facility]

## 2022-11-30 ENCOUNTER — Telehealth: Payer: Self-pay | Admitting: *Deleted

## 2022-11-30 NOTE — Telephone Encounter (Signed)
[facility]   EGD/ED/CS w/Dr.Rourk, ASA 3

## 2022-12-01 ENCOUNTER — Encounter: Payer: Self-pay | Admitting: Nurse Practitioner

## 2022-12-01 NOTE — Telephone Encounter (Signed)
Pt left vm returning call  [facility]

## 2022-12-09 DIAGNOSIS — Z79899 Other long term (current) drug therapy: Secondary | ICD-10-CM | POA: Diagnosis not present

## 2022-12-28 ENCOUNTER — Telehealth: Payer: Self-pay | Admitting: Cardiology

## 2022-12-28 NOTE — Telephone Encounter (Signed)
Addressed in results note

## 2022-12-28 NOTE — Telephone Encounter (Signed)
Patient returned RN's call. 

## 2022-12-30 ENCOUNTER — Telehealth: Payer: Self-pay | Admitting: Gastroenterology

## 2022-12-30 NOTE — Telephone Encounter (Signed)
Records reviewed.   Patients last colonoscopy in December 2019. Completely normal. Advised repeat in 5 years.   No records for EGD.   Venetia Night, MSN, APRN, FNP-BC, AGACNP-BC [facility]

## 2023-01-10 ENCOUNTER — Ambulatory Visit: Payer: Medicare HMO | Attending: Internal Medicine | Admitting: Nurse Practitioner

## 2023-01-10 VITALS — BP 140/76 | Ht 60.0 in | Wt 135.8 lb

## 2023-01-10 DIAGNOSIS — I10 Essential (primary) hypertension: Secondary | ICD-10-CM | POA: Diagnosis not present

## 2023-01-10 NOTE — Progress Notes (Unsigned)
Patient in the office as a nurse visit to have her blood pressure checked since having lisinopril increased to 5 mg once a day. Denies any pains or SOB but does have intermittent dizziness when getting up too fast.  BP 152/80 and after a few minutes came down to 140/76.   Patient states that she has been under a lot of stress and wonders if this could be the cause of her elevated blood pressure.  Patient brought BP log for review.

## 2023-01-14 NOTE — Progress Notes (Signed)
Patient made aware, verbalized understanding

## 2023-01-18 ENCOUNTER — Other Ambulatory Visit: Payer: Self-pay | Admitting: Gastroenterology

## 2023-01-18 ENCOUNTER — Encounter: Payer: Self-pay | Admitting: *Deleted

## 2023-01-18 NOTE — Telephone Encounter (Signed)
Mailed letter °

## 2023-01-24 ENCOUNTER — Telehealth: Payer: Self-pay | Admitting: *Deleted

## 2023-01-24 ENCOUNTER — Encounter: Payer: Self-pay | Admitting: *Deleted

## 2023-01-24 ENCOUNTER — Other Ambulatory Visit: Payer: Self-pay | Admitting: *Deleted

## 2023-01-24 MED ORDER — PEG 3350-KCL-NA BICARB-NACL 420 G PO SOLR: 4000.0000 mL | Freq: Once | ORAL | 0 refills | Status: AC

## 2023-01-24 NOTE — Telephone Encounter (Signed)
Pt has been scheduled for TCS/EGD/ED for 03/04/23. Instructions mailed and prep sent to the pharmacy.   [employer] PA: Approved Authorization #[id number]  Tracking # [id number] DOS:03/04/23-06/03/23

## 2023-01-25 ENCOUNTER — Encounter: Payer: Self-pay | Admitting: *Deleted

## 2023-02-21 ENCOUNTER — Ambulatory Visit: Payer: Medicare HMO | Admitting: Gastroenterology

## 2023-02-21 ENCOUNTER — Encounter: Payer: Self-pay | Admitting: Gastroenterology

## 2023-02-21 VITALS — BP 138/80 | HR 79 | Temp 97.9°F | Ht 60.0 in | Wt 134.4 lb

## 2023-02-21 DIAGNOSIS — R1310 Dysphagia, unspecified: Secondary | ICD-10-CM

## 2023-02-21 DIAGNOSIS — K219 Gastro-esophageal reflux disease without esophagitis: Secondary | ICD-10-CM | POA: Diagnosis not present

## 2023-02-21 DIAGNOSIS — R5383 Other fatigue: Secondary | ICD-10-CM

## 2023-02-21 DIAGNOSIS — K5900 Constipation, unspecified: Secondary | ICD-10-CM | POA: Diagnosis not present

## 2023-02-21 DIAGNOSIS — Z86010 Personal history of colonic polyps: Secondary | ICD-10-CM

## 2023-02-21 NOTE — Progress Notes (Signed)
GI Office Note    Referring Provider: Lawerance Sabal, [place] Primary Care Physician:  Lawerance Sabal, [place] Primary Gastroenterologist: Gerrit Friends.Rourk, MD  Date:  02/21/2023  ID:  Andrea Herrera, DOB [birth year], MRN [id number]   Chief Complaint   Chief Complaint  Patient presents with   Follow-up    Follow up. No problems   History of Present Illness  Andrea Herrera is a 72 y.o. female with a history of HTN, prediabetes, CAD, MI in 2015 with stent placement, HLD, GERD, constipation presenting today for follow-up of GERD, constipation, and dysphagia.  Colonoscopy in December 2019 normal.  Advised repeat in 5 years.  Initial office visit 11/23/2022.  Having difficulty with swallowing as well as a burning sensation in her stomach.  Using Mylanta as needed.  Usually occurs with sausage, spaghetti sauce, pizza, and some other meats.  Also noted again for collicular and having nasty burps.  Hydron with constipation, using laxative every 2 weeks or once per month due to hard stools.  Usually has a bowel movement every 3 days and does have to strain.  Tried senna which was not effective.  Had not tried any MiraLAX. Advised Colace daily, MiraLAX daily.  Start pantoprazole 40 mg once daily.  Scheduled for EGD with dilation and colonoscopy.  Check iron and CBC.  Labs 11/24/2022: Hemoglobin 11.7, platelets 281, normal iron panel.  Patient scheduled for EGD/ED/TCS for 03/04/2023.   Today: Constipation - Taking miralax in the evenings and stool softener daily or every other day. Stool is dark while taking iron. No abdominal pain.   GERD - Pantoprazole 40 mg once daily. No more burning, nausea, vomiting. Appetite is so-so . Getting dental work right now. Baseline does not eat a lot. No weight loss.   Dysphagia - Somewhat better but not compete resolved.   Still having a lot of fatigue.  Keeps pushing on to do it she needs to do but feels that fatigue in her chest.  Denies any chest pain or shortness of  breath.  Follows with Dr. Wyline Mood with cardiology regularly.  Wt Readings from Last 3 Encounters:  02/21/23 134 lb 6.4 oz (61 kg)  01/10/23 135 lb 12.8 oz (61.6 kg)  11/29/22 133 lb 3.2 oz (60.4 kg)    Current Outpatient Medications  Medication Sig Dispense Refill   amLODipine (NORVASC) 10 MG tablet Take 1 tablet by mouth once daily 90 tablet 1   aspirin EC 81 MG tablet Take 1 tablet (81 mg total) by mouth daily. 30 tablet 3   atorvastatin (LIPITOR) 80 MG tablet TAKE 1 TABLET BY MOUTH ONCE DAILY AT  6  PM 90 tablet 3   cholecalciferol (VITAMIN D) 1000 UNITS tablet Take 1,000 Units by mouth daily.     gabapentin (NEURONTIN) 100 MG capsule Take 200 mg by mouth as needed.     glipiZIDE (GLUCOTROL XL) 5 MG 24 hr tablet Take 5 mg by mouth daily.     indomethacin (INDOCIN) 25 MG capsule Take 25 mg by mouth as needed.     lisinopril (ZESTRIL) 5 MG tablet Take 1 tablet (5 mg total) by mouth daily. 30 tablet 6   metFORMIN (GLUCOPHAGE-XR) 500 MG 24 hr tablet Take 500 mg by mouth 2 (two) times daily with a meal.      metoprolol tartrate (LOPRESSOR) 25 MG tablet Take 1 tablet by mouth twice daily 180 tablet 1   Multiple Vitamin (MULTIVITAMIN) tablet Take 1 tablet by mouth daily.  nitroGLYCERIN (NITROSTAT) 0.4 MG SL tablet Place 1 tablet (0.4 mg total) under the tongue every 5 (five) minutes x 3 doses as needed for chest pain. 25 tablet 3   Omega-3 Fatty Acids (FISH OIL) 1000 MG CAPS Take by mouth. Every other day per patient.     pantoprazole (PROTONIX) 40 MG tablet Take 1 tablet by mouth once daily 30 tablet 5   POTASSIUM PO Take 99 mg by mouth daily. OTC K+     CALCIUM PO Take by mouth daily. (Patient not taking: Reported on 02/21/2023)     No current facility-administered medications for this visit.    Past Medical History:  Diagnosis Date   CAD (coronary artery disease)    Hx of admission in 2016 for inferior STEMI. Received 2 DES to RCA. EF was normal.   HLD (hyperlipidemia)     Hypertension    Pre-diabetes     Past Surgical History:  Procedure Laterality Date   CARDIAC CATHETERIZATION N/A 04/26/2015   Procedure: Left Heart Cath and Coronary Angiography;  Surgeon: Runell Gess, MD;  Location: [facility];  Service: Cardiovascular;  Laterality: N/A;    Family History  Problem Relation Age of Onset   Colon cancer Mother    Lung cancer Father    Stroke Father    Pancreatic cancer Brother    Heart disease Paternal Grandmother    Stroke Paternal Grandmother    Heart disease Paternal Grandfather    Stroke Paternal Grandfather     Allergies as of 02/21/2023   (No Known Allergies)    Social History   Socioeconomic History   Marital status: Divorced    Spouse name: Not on file   Number of children: Not on file   Years of education: Not on file   Highest education level: Not on file  Occupational History   Not on file  Tobacco Use   Smoking status: Former    Packs/day: 0.25    Years: 22.00    Additional pack years: 0.00    Total pack years: 5.50    Types: Cigarettes    Start date: 09/11/1987    Quit date: 04/21/2015    Years since quitting: 7.8    Passive exposure: Never   Smokeless tobacco: Never  Vaping Use   Vaping Use: Never used  Substance and Sexual Activity   Alcohol use: No    Alcohol/week: 0.0 standard drinks of alcohol   Drug use: No   Sexual activity: Not on file  Other Topics Concern   Not on file  Social History Narrative   Not on file   Social Determinants of Health   Financial Resource Strain: Not on file  Food Insecurity: Not on file  Transportation Needs: Not on file  Physical Activity: Not on file  Stress: Not on file  Social Connections: Not on file     Review of Systems   Gen: Denies fever, chills, anorexia. Denies fatigue, weakness, weight loss.  CV: Denies chest pain, palpitations, syncope, peripheral edema, and claudication. Resp: Denies dyspnea at rest, cough, wheezing, coughing up blood, and  pleurisy. GI: See HPI Derm: Denies rash, itching, dry skin Psych: Denies depression, anxiety, memory loss, confusion. No homicidal or suicidal ideation.  Heme: Denies bruising, bleeding, and enlarged lymph nodes.   Physical Exam   BP 138/80 (BP Location: Right Arm, Patient Position: Sitting, Cuff Size: Normal)   Pulse 79   Temp 97.9 F (36.6 C) (Temporal)   Ht 5' (1.524 m)  Wt 134 lb 6.4 oz (61 kg)   SpO2 98%   BMI 26.25 kg/m   General:   Alert and oriented. No distress noted. Pleasant and cooperative.  Head:  Normocephalic and atraumatic. Eyes:  Conjuctiva clear without scleral icterus. Mouth:  Oral mucosa pink and moist. Good dentition. No lesions. Lungs:  Clear to auscultation bilaterally. No wheezes, rales, or rhonchi. No distress.  Heart:  S1, S2 present without murmurs appreciated.  Abdomen:  +BS, soft, non-tender and non-distended. No rebound or guarding. No HSM or masses noted. Rectal: deferred Msk:  Symmetrical without gross deformities. Normal posture. Extremities:  Without edema. Neurologic:  Alert and  oriented x4 Psych:  Alert and cooperative. Normal mood and affect.   Assessment  Andrea Herrera is a 72 y.o. female with a history of HTN, prediabetes, CAD, MI in 2015 with stent placement, HLD, GERD, constipation presenting today for follow-up of GERD, constipation, and dysphagia.  Constipation: Doing well on Colace daily or every other day and MiraLAX nightly.  No longer having hard stools and very minimal straining.  History of colon polyps: Last colonoscopy in December 2019 at [facility] which was normal.  Reports a prior history of colon polyps about 10-12 years ago.  Colonoscopy scheduled at last visit, will be performed 03/04/2023.  We reviewed her prep instructions today.  GERD, epigastric pain, dysphagia: Continues to experience some difficulty with swallowing feeling like food stuck in her lower chest.  Indigestion symptoms have improved with pantoprazole 40 mg  once daily.  GERD diet reinforced.  Fatigue: Continues to experience fatigue despite hemoglobin and iron panel on labs from February.  Per review of chart does not appear that she has had thyroid, B12, or vitamin D checked recently therefore we will check these to further assess her fatigue.  She is also scheduled for EGD and colonoscopy that is upcoming.  PLAN   TSH, B12, vitamin D Continue daily iron supplement. Continue pantoprazole 40 mg daily GERD diet Continue MiraLAX 17 g once daily Continue Colace 1 tablet daily Proceed with scheduled EGD/EGD/TCS Follow up in 3-4 months.    Brooke Bonito, MSN, FNP-BC, AGACNP-BC [facility]

## 2023-02-21 NOTE — Patient Instructions (Addendum)
Please have blood work completed at [facility].  We will call you with results once they have been received. Please allow 3-5 business days for review. Please have these done within the next 7-10 days.  2 locations for [facility] in [place]:              1. [address], [place]              2. [address] [place],     Continue MiraLAX and stool softener daily.  Continue your daily iron supplement.  Continue pantoprazole 40 mg daily. Follow a GERD diet:  Avoid fried, fatty, greasy, spicy, citrus foods. Avoid caffeine and carbonated beverages. Avoid chocolate. Try eating 4-6 small meals a day rather than 3 large meals. Do not eat within 3 hours of laying down. Prop head of bed up on wood or bricks to create a 6 inch incline.   We will plan to follow-up in 3-4 months to see how you are doing after your procedure.  Do not hesitate to reach out if you have any questions or concerns in the meantime.  It was a pleasure to see you today. I want to create trusting relationships with patients. If you receive a survey regarding your visit,  I greatly appreciate you taking time to fill this out on paper or through your MyChart. I value your feedback.  Brooke Bonito, MSN, FNP-BC, AGACNP-BC [facility]

## 2023-02-25 NOTE — Patient Instructions (Signed)
Andrea Herrera  02/25/2023     @PREFPERIOPPHARMACY @   Your procedure is scheduled on  03/04/2023.   Report to Andrea Herrera at  0745 A.M.   Call this number if you have problems the morning of surgery:  [phone]  If you experience any cold or flu symptoms such as cough, fever, chills, shortness of breath, etc. between now and your scheduled surgery, please notify Andrea Herrera at the above number.   Remember:  Follow the diet and prep instructions given to you by the office.      DO NOT take any medications for diabetes the morning of your procedure.     Take these medicines the morning of surgery with A SIP OF WATER        amlodipine, gabapentin, indomethacin (if needed), metoprolol, pantoprazole.     Do not wear jewelry, make-up or nail polish.  Do not wear lotions, powders, or perfumes, or deodorant.  Do not shave 48 hours prior to surgery.  Men may shave face and neck.  Do not bring valuables to the hospital.  [facility] is not responsible for any belongings or valuables.  Contacts, dentures or bridgework may not be worn into surgery.  Leave your suitcase in the car.  After surgery it may be brought to your room.  For patients admitted to the hospital, discharge time will be determined by your treatment team.  Patients discharged the day of surgery will not be allowed to drive home and must have someone with them for 24 hours.    Special instructions:   DO NOT smoke tobacco or vape for 24 hours before your procedure.  Please read over the following fact sheets that you were given. Anesthesia Post-op Instructions and Care and Recovery After Surgery      Upper Endoscopy, Adult, Care After After the procedure, it is common to have a sore throat. It is also common to have: Mild stomach pain or discomfort. Bloating. Nausea. Follow these instructions at home: The instructions below may help you care for yourself at home. Your health care provider may give you  more instructions. If you have questions, ask your health care provider. If you were given a sedative during the procedure, it can affect you for several hours. Do not drive or operate machinery until your health care provider says that it is safe. If you will be going home right after the procedure, plan to have a responsible adult: Take you home from the hospital or clinic. You will not be allowed to drive. Care for you for the time you are told. Follow instructions from your health care provider about what you may eat and drink. Return to your normal activities as told by your health care provider. Ask your health care provider what activities are safe for you. Take over-the-counter and prescription medicines only as told by your health care provider. Contact a health care provider if you: Have a sore throat that lasts longer than one day. Have trouble swallowing. Have a fever. Get help right away if you: Vomit blood or your vomit looks like coffee grounds. Have bloody, black, or tarry stools. Have a very bad sore throat or you cannot swallow. Have difficulty breathing or very bad pain in your chest or abdomen. These symptoms may be an emergency. Get help right away. Call 911. Do not wait to see if the symptoms will go away. Do not drive yourself to the hospital. Summary After the  procedure, it is common to have a sore throat, mild stomach discomfort, bloating, and nausea. If you were given a sedative during the procedure, it can affect you for several hours. Do not drive until your health care provider says that it is safe. Follow instructions from your health care provider about what you may eat and drink. Return to your normal activities as told by your health care provider. This information is not intended to replace advice given to you by your health care provider. Make sure you discuss any questions you have with your health care provider. Document Revised: 01/13/2022 Document  Reviewed: 01/13/2022 Elsevier Patient Education  2023 [employer] Esophageal Dilatation Esophageal dilatation, also called esophageal dilation, is a procedure to widen or open a blocked or narrowed part of the esophagus. The esophagus is the part of the body that moves food and liquid from the mouth to the stomach. You may need this procedure if: You have a buildup of scar tissue in your esophagus that makes it difficult, painful, or impossible to swallow. This can be caused by gastroesophageal reflux disease (GERD). You have cancer of the esophagus. There is a problem with how food moves through your esophagus. In some cases, you may need this procedure repeated at a later time to dilate the esophagus gradually. Tell a health care provider about: Any allergies you have. All medicines you are taking, including vitamins, herbs, eye drops, creams, and over-the-counter medicines. Any problems you or family members have had with anesthetic medicines. Any blood disorders you have. Any surgeries you have had. Any medical conditions you have. Any antibiotic medicines you are required to take before dental procedures. Whether you are pregnant or may be pregnant. What are the risks? Generally, this is a safe procedure. However, problems may occur, including: Bleeding due to a tear in the lining of the esophagus. A hole, or perforation, in the esophagus. What happens before the procedure? Ask your health care provider about: Changing or stopping your regular medicines. This is especially important if you are taking diabetes medicines or blood thinners. Taking medicines such as aspirin and ibuprofen. These medicines can thin your blood. Do not take these medicines unless your health care provider tells you to take them. Taking over-the-counter medicines, vitamins, herbs, and supplements. Follow instructions from your health care provider about eating or drinking restrictions. Plan to have a  responsible adult take you home from the hospital or clinic. Plan to have a responsible adult care for you for the time you are told after you leave the hospital or clinic. This is important. What happens during the procedure? You may be given a medicine to help you relax (sedative). A numbing medicine may be sprayed into the back of your throat, or you may gargle the medicine. Your health care provider may perform the dilatation using various surgical instruments, such as: Simple dilators. This instrument is carefully placed in the esophagus to stretch it. Guided wire bougies. This involves using an endoscope to insert a wire into the esophagus. A dilator is passed over this wire to enlarge the esophagus. Then the wire is removed. Balloon dilators. An endoscope with a small balloon is inserted into the esophagus. The balloon is inflated to stretch the esophagus and open it up. The procedure may vary among health care providers and hospitals. What can I expect after the procedure? Your blood pressure, heart rate, breathing rate, and blood oxygen level will be monitored until you leave the hospital or clinic. Your throat  may feel slightly sore and numb. This will get better over time. You will not be allowed to eat or drink until your throat is no longer numb. When you are able to drink, urinate, and sit on the edge of the bed without nausea or dizziness, you may be able to return home. Follow these instructions at home: Take over-the-counter and prescription medicines only as told by your health care provider. If you were given a sedative during the procedure, it can affect you for several hours. Do not drive or operate machinery until your health care provider says that it is safe. Plan to have a responsible adult care for you for the time you are told. This is important. Follow instructions from your health care provider about any eating or drinking restrictions. Do not use any products that  contain nicotine or tobacco, such as cigarettes, e-cigarettes, and chewing tobacco. If you need help quitting, ask your health care provider. Keep all follow-up visits. This is important. Contact a health care provider if: You have a fever. You have pain that is not relieved by medicine. Get help right away if: You have chest pain. You have trouble breathing. You have trouble swallowing. You vomit blood. You have black, tarry, or bloody stools. These symptoms may represent a serious problem that is an emergency. Do not wait to see if the symptoms will go away. Get medical help right away. Call your local emergency services (911 in the [place] Do not drive yourself to the hospital. Summary Esophageal dilatation, also called esophageal dilation, is a procedure to widen or open a blocked or narrowed part of the esophagus. Plan to have a responsible adult take you home from the hospital or clinic. For this procedure, a numbing medicine may be sprayed into the back of your throat, or you may gargle the medicine. Do not drive or operate machinery until your health care provider says that it is safe. This information is not intended to replace advice given to you by your health care provider. Make sure you discuss any questions you have with your health care provider. Document Revised: 02/20/2020 Document Reviewed: 02/20/2020 Elsevier Patient Education  2023 [employer] Inc. Colonoscopy, Adult, Care After The following information offers guidance on how to care for yourself after your procedure. Your health care provider may also give you more specific instructions. If you have problems or questions, contact your health care provider. What can I expect after the procedure? After the procedure, it is common to have: A small amount of blood in your stool for 24 hours after the procedure. Some gas. Mild cramping or bloating of your abdomen. Follow these instructions at home: Eating and  drinking  Drink enough fluid to keep your urine pale yellow. Follow instructions from your health care provider about eating or drinking restrictions. Resume your normal diet as told by your health care provider. Avoid heavy or fried foods that are hard to digest. Activity Rest as told by your health care provider. Avoid sitting for a long time without moving. Get up to take short walks every 1-2 hours. This is important to improve blood flow and breathing. Ask for help if you feel weak or unsteady. Return to your normal activities as told by your health care provider. Ask your health care provider what activities are safe for you. Managing cramping and bloating  Try walking around when you have cramps or feel bloated. If directed, apply heat to your abdomen as told by your health care provider.  Use the heat source that your health care provider recommends, such as a moist heat pack or a heating pad. Place a towel between your skin and the heat source. Leave the heat on for 20-30 minutes. Remove the heat if your skin turns bright red. This is especially important if you are unable to feel pain, heat, or cold. You have a greater risk of getting burned. General instructions If you were given a sedative during the procedure, it can affect you for several hours. Do not drive or operate machinery until your health care provider says that it is safe. For the first 24 hours after the procedure: Do not sign important documents. Do not drink alcohol. Do your regular daily activities at a slower pace than normal. Eat soft foods that are easy to digest. Take over-the-counter and prescription medicines only as told by your health care provider. Keep all follow-up visits. This is important. Contact a health care provider if: You have blood in your stool 2-3 days after the procedure. Get help right away if: You have more than a small spotting of blood in your stool. You have large blood clots in your  stool. You have swelling of your abdomen. You have nausea or vomiting. You have a fever. You have increasing pain in your abdomen that is not relieved with medicine. These symptoms may be an emergency. Get help right away. Call 911. Do not wait to see if the symptoms will go away. Do not drive yourself to the hospital. Summary After the procedure, it is common to have a small amount of blood in your stool. You may also have mild cramping and bloating of your abdomen. If you were given a sedative during the procedure, it can affect you for several hours. Do not drive or operate machinery until your health care provider says that it is safe. Get help right away if you have a lot of blood in your stool, nausea or vomiting, a fever, or increased pain in your abdomen. This information is not intended to replace advice given to you by your health care provider. Make sure you discuss any questions you have with your health care provider. Document Revised: 05/27/2021 Document Reviewed: 05/27/2021 Elsevier Patient Education  2023 Else[employer] Inc. Monitored Anesthesia Care, Care After The following information offers guidance on how to care for yourself after your procedure. Your health care provider may also give you more specific instructions. If you have problems or questions, contact your health care provider. What can I expect after the procedure? After the procedure, it is common to have: Tiredness. Little or no memory about what happened during or after the procedure. Impaired judgment when it comes to making decisions. Nausea or vomiting. Some trouble with balance. Follow these instructions at home: For the time period you were told by your health care provider:  Rest. Do not participate in activities where you could fall or become injured. Do not drive or use machinery. Do not drink alcohol. Do not take sleeping pills or medicines that cause drowsiness. Do not make important decisions or  sign legal documents. Do not take care of children on your own. Medicines Take over-the-counter and prescription medicines only as told by your health care provider. If you were prescribed antibiotics, take them as told by your health care provider. Do not stop using the antibiotic even if you start to feel better. Eating and drinking Follow instructions from your health care provider about what you may eat and drink. Drink enough  fluid to keep your urine pale yellow. If you vomit: Drink clear fluids slowly and in small amounts as you are able. Clear fluids include water, ice chips, low-calorie sports drinks, and fruit juice that has water added to it (diluted fruit juice). Eat light and bland foods in small amounts as you are able. These foods include bananas, applesauce, rice, lean meats, toast, and crackers. General instructions  Have a responsible adult stay with you for the time you are told. It is important to have someone help care for you until you are awake and alert. If you have sleep apnea, surgery and some medicines can increase your risk for breathing problems. Follow instructions from your health care provider about wearing your sleep device: When you are sleeping. This includes during daytime naps. While taking prescription pain medicines, sleeping medicines, or medicines that make you drowsy. Do not use any products that contain nicotine or tobacco. These products include cigarettes, chewing tobacco, and vaping devices, such as e-cigarettes. If you need help quitting, ask your health care provider. Contact a health care provider if: You feel nauseous or vomit every time you eat or drink. You feel light-headed. You are still sleepy or having trouble with balance after 24 hours. You get a rash. You have a fever. You have redness or swelling around the IV site. Get help right away if: You have trouble breathing. You have new confusion after you get home. These symptoms may be  an emergency. Get help right away. Call 911. Do not wait to see if the symptoms will go away. Do not drive yourself to the hospital. This information is not intended to replace advice given to you by your health care provider. Make sure you discuss any questions you have with your health care provider. Document Revised: 03/01/2022 Document Reviewed: 03/01/2022 Elsevier Patient Education  2023 [employer].

## 2023-03-01 ENCOUNTER — Encounter (HOSPITAL_COMMUNITY): Payer: Self-pay

## 2023-03-01 ENCOUNTER — Encounter (HOSPITAL_COMMUNITY)
Admission: RE | Admit: 2023-03-01 | Discharge: 2023-03-01 | Disposition: A | Discharge status: Home / Self Care | Payer: Medicare HMO | Source: Ambulatory Visit | Attending: Internal Medicine | Admitting: Internal Medicine

## 2023-03-01 VITALS — BP 149/67 | HR 79 | Temp 97.9°F | Resp 18 | Ht 60.0 in | Wt 134.4 lb

## 2023-03-01 DIAGNOSIS — E119 Type 2 diabetes mellitus without complications: Secondary | ICD-10-CM | POA: Insufficient documentation

## 2023-03-01 DIAGNOSIS — Z01812 Encounter for preprocedural laboratory examination: Secondary | ICD-10-CM | POA: Insufficient documentation

## 2023-03-01 HISTORY — DX: Anemia, unspecified: D64.9

## 2023-03-01 HISTORY — DX: Type 2 diabetes mellitus without complications: E11.9

## 2023-03-01 LAB — BASIC METABOLIC PANEL
Anion gap: 10 (ref 5–15)
BUN: 15 mg/dL (ref 8–23)
CO2: 26 mmol/L (ref 22–32)
Calcium: 9.8 mg/dL (ref 8.9–10.3)
Chloride: 100 mmol/L (ref 98–111)
Creatinine, Ser: 0.79 mg/dL (ref 0.44–1.00)
GFR, Estimated: >60 mL/min (ref >60)
Glucose, Bld: 398 mg/dL — ABNORMAL HIGH (ref 70–99)
Potassium: 4.2 mmol/L (ref 3.5–5.1)
Sodium: 136 mmol/L (ref 135–145)

## 2023-03-02 ENCOUNTER — Telehealth: Payer: Self-pay | Admitting: *Deleted

## 2023-03-02 DIAGNOSIS — I10 Essential (primary) hypertension: Secondary | ICD-10-CM | POA: Diagnosis not present

## 2023-03-02 DIAGNOSIS — H6502 Acute serous otitis media, left ear: Secondary | ICD-10-CM | POA: Diagnosis not present

## 2023-03-02 DIAGNOSIS — Z7984 Long term (current) use of oral hypoglycemic drugs: Secondary | ICD-10-CM | POA: Diagnosis not present

## 2023-03-02 DIAGNOSIS — E1165 Type 2 diabetes mellitus with hyperglycemia: Secondary | ICD-10-CM | POA: Diagnosis not present

## 2023-03-02 DIAGNOSIS — R42 Dizziness and giddiness: Secondary | ICD-10-CM | POA: Diagnosis not present

## 2023-03-02 DIAGNOSIS — Z7982 Long term (current) use of aspirin: Secondary | ICD-10-CM | POA: Diagnosis not present

## 2023-03-02 DIAGNOSIS — F17200 Nicotine dependence, unspecified, uncomplicated: Secondary | ICD-10-CM | POA: Diagnosis not present

## 2023-03-02 NOTE — Telephone Encounter (Signed)
-----   Message from Jethro Bolus, RN sent at 03/02/2023  4:31 PM EDT ----- Regarding: Cancelaltion Hello everyone! TCS/EGD/ED was cancelled by Dr Pilar Plate due to elevated blood sugar 398. She will follow up with her PCP for medication management and then reschedule.

## 2023-03-02 NOTE — Progress Notes (Signed)
Patient procedure TCS/EGD/ED has been cancelled due to elevated CBG (398). She is to follow up with her primary care provider for medication management so we can reschedule as soon as possible.

## 2023-03-02 NOTE — Telephone Encounter (Signed)
FYI

## 2023-03-03 ENCOUNTER — Encounter (HOSPITAL_COMMUNITY): Payer: Self-pay | Admitting: Anesthesiology

## 2023-03-03 ENCOUNTER — Ambulatory Visit: Payer: Medicare HMO | Admitting: Nutrition

## 2023-03-04 ENCOUNTER — Ambulatory Visit (HOSPITAL_COMMUNITY): Admission: RE | Admit: 2023-03-04 | Payer: Medicare HMO | Source: Home / Self Care | Admitting: Internal Medicine

## 2023-03-04 ENCOUNTER — Encounter (HOSPITAL_COMMUNITY): Admission: RE | Payer: Self-pay | Source: Home / Self Care

## 2023-03-04 DIAGNOSIS — E119 Type 2 diabetes mellitus without complications: Secondary | ICD-10-CM

## 2023-03-04 DIAGNOSIS — Z86010 Personal history of colonic polyps: Secondary | ICD-10-CM

## 2023-03-04 DIAGNOSIS — K219 Gastro-esophageal reflux disease without esophagitis: Secondary | ICD-10-CM

## 2023-03-04 SURGERY — COLONOSCOPY WITH PROPOFOL
Anesthesia: Monitor Anesthesia Care

## 2023-03-07 DIAGNOSIS — F419 Anxiety disorder, unspecified: Secondary | ICD-10-CM | POA: Diagnosis not present

## 2023-03-07 DIAGNOSIS — E1165 Type 2 diabetes mellitus with hyperglycemia: Secondary | ICD-10-CM | POA: Diagnosis not present

## 2023-03-07 DIAGNOSIS — F17210 Nicotine dependence, cigarettes, uncomplicated: Secondary | ICD-10-CM | POA: Diagnosis not present

## 2023-03-07 DIAGNOSIS — E7849 Other hyperlipidemia: Secondary | ICD-10-CM | POA: Diagnosis not present

## 2023-03-07 DIAGNOSIS — I10 Essential (primary) hypertension: Secondary | ICD-10-CM | POA: Diagnosis not present

## 2023-03-07 DIAGNOSIS — Z6827 Body mass index (BMI) 27.0-27.9, adult: Secondary | ICD-10-CM | POA: Diagnosis not present

## 2023-03-10 DIAGNOSIS — M810 Age-related osteoporosis without current pathological fracture: Secondary | ICD-10-CM | POA: Diagnosis not present

## 2023-03-10 DIAGNOSIS — M8589 Other specified disorders of bone density and structure, multiple sites: Secondary | ICD-10-CM | POA: Diagnosis not present

## 2023-03-11 DIAGNOSIS — Z6826 Body mass index (BMI) 26.0-26.9, adult: Secondary | ICD-10-CM | POA: Diagnosis not present

## 2023-03-11 DIAGNOSIS — E7849 Other hyperlipidemia: Secondary | ICD-10-CM | POA: Diagnosis not present

## 2023-03-11 DIAGNOSIS — E1165 Type 2 diabetes mellitus with hyperglycemia: Secondary | ICD-10-CM | POA: Diagnosis not present

## 2023-03-11 DIAGNOSIS — I2510 Atherosclerotic heart disease of native coronary artery without angina pectoris: Secondary | ICD-10-CM | POA: Diagnosis not present

## 2023-03-11 DIAGNOSIS — Z0001 Encounter for general adult medical examination with abnormal findings: Secondary | ICD-10-CM | POA: Diagnosis not present

## 2023-03-11 DIAGNOSIS — F17210 Nicotine dependence, cigarettes, uncomplicated: Secondary | ICD-10-CM | POA: Diagnosis not present

## 2023-03-11 DIAGNOSIS — I10 Essential (primary) hypertension: Secondary | ICD-10-CM | POA: Diagnosis not present

## 2023-03-25 ENCOUNTER — Ambulatory Visit: Payer: Medicare HMO | Admitting: Cardiology

## 2023-03-28 ENCOUNTER — Ambulatory Visit: Payer: Medicare HMO | Admitting: Nutrition

## 2023-04-05 ENCOUNTER — Encounter: Payer: Medicare HMO | Attending: Family Medicine | Admitting: Nutrition

## 2023-04-05 VITALS — Ht 60.0 in | Wt 129.0 lb

## 2023-04-05 DIAGNOSIS — E785 Hyperlipidemia, unspecified: Secondary | ICD-10-CM | POA: Insufficient documentation

## 2023-04-05 DIAGNOSIS — I252 Old myocardial infarction: Secondary | ICD-10-CM | POA: Diagnosis not present

## 2023-04-05 DIAGNOSIS — I10 Essential (primary) hypertension: Secondary | ICD-10-CM | POA: Insufficient documentation

## 2023-04-05 DIAGNOSIS — E118 Type 2 diabetes mellitus with unspecified complications: Secondary | ICD-10-CM | POA: Diagnosis not present

## 2023-04-05 NOTE — Progress Notes (Unsigned)
Medical Nutrition Therapy Dm Follow up Appointment Start time:  1545 Appointment End time:  1615  Primary concerns today: DM Type 2  Referral diagnosis: E11.8 Preferred learning style: No preference. Learning readiness: Ready   NUTRITION ASSESSMENT  DM Follow up Changes made:   FBS 140's   Just started Jardiance for 25 mg about 2 weeks. Colonoscopy canceled due to high BS Swallowing is better and indigestion is better since being on meds. She note notes she is going to the bathroom a lot and can't make it sometimes. Her frequent urination started with the Jardiance. Would like to try a lesser dose to help with too much urination.Is drinking a lot of water. NO rash or vaginal issues other than too much urination.   Previous Goals Established by Pt  Check blood sugar before breakfast and before bid-working on it. Take Metformin after breakfast- reduce to 500 mg BID isntead of 1000 mg BID since it is ER. Increase low carb vegetables-done Don't skip meals -done  Anthropometrics  Wt Readings from Last 3 Encounters:  03/01/23 134 lb 6.4 oz (61 kg)  02/21/23 134 lb 6.4 oz (61 kg)  01/10/23 135 lb 12.8 oz (61.6 kg)   Ht Readings from Last 3 Encounters:  03/01/23 5' (1.524 m)  02/21/23 5' (1.524 m)  01/10/23 5' (1.524 m)   There is no height or weight on file to calculate BMI. @BMIFA @ Facility age limit for growth %iles is 20 years. Facility age limit for growth %iles is 20 years.    Clinical Medical Hx: HA 2015, Type 2 DM Medications: Metformin 500 mg BID, Glipizide 5 mg a day. Had been increased Linsinopril to 40 mg by cardiology   Labs:  Lab Results  Component Value Date   HGBA1C 6.7 (H) 04/27/2015      Latest Ref Rng & Units 03/01/2023    9:59 AM 03/23/2021    9:50 AM 04/26/2015    8:46 AM  CMP  Glucose 70 - 99 mg/dL 161  096  045   BUN 8 - 23 mg/dL 15  7  <5   Creatinine 0.44 - 1.00 mg/dL 4.09  8.11  9.14   Sodium 135 - 145 mmol/L 136  137  138   Potassium 3.5 - 5.1  mmol/L 4.2  4.3  3.7   Chloride 98 - 111 mmol/L 100  101  103   CO2 22 - 32 mmol/L 26  27  26    Calcium 8.9 - 10.3 mg/dL 9.8  9.6  9.4     Notable Signs/Symptoms: Blurry vision  Lifestyle & Dietary Hx Lives with her son. Works PT for [employer] in [place].  Estimated daily fluid intake: 40 oz Supplements: MVI, VIt D Sleep: 8/9 hrs  Stress / self-care: none Current average weekly physical activity: Cardiac rehab exercises daily.  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   NUTRITION INTERVENTION  Nutrition education (E-1) on the following topics:  Nutrition and Diabetes education provided on My Plate, CHO counting, meal planning, portion sizes, timing of meals, avoiding snacks between meals unless having a low blood sugar, target ranges for A1C and  blood sugars, signs/symptoms and treatment of hyper/hypoglycemia, monitoring blood sugars, taking medications as prescribed, benefits of exercising 30 minutes per day and prevention of complications of DM. Low Cholesterol, high fiber diet  Handouts Provided Include  Plant based plate method   Learning Style & Readiness for Change Teaching method utilized: Visual & Auditory  Demonstrated degree of understanding via: Teach Back  Barriers to learning/adherence to lifestyle change: none   Goals   Keep up great job. Talk to MD about reducing Jardiance to 10 mg a day instead of 25 mg. Ask about reducing Metformin to 500 mg BID since its ER Metformin.  Education:   The following Lifestyle Medicine recommendations according to American College of  Lifestyle Medicine  [facility]) were discussed and and offered to patient and she  agrees to start the journey:  A. Whole Foods, Plant-Based Nutrition comprising of fruits and vegetables, plant-based proteins, whole-grain carbohydrates was discussed in detail with the patient.   A list for source of those nutrients were also provided to the patient.  Patient will use only water or unsweetened tea for hydration. B.  The need to stay away from risky substances including alcohol, smoking; obtaining 7 to 9 hours of restorative sleep, at least 150 minutes of moderate intensity exercise weekly, the importance of healthy social connections,  and stress management techniques were discussed. C.  A full color page of  Calorie density of various food groups per pound showing examples of each food groups was provided to the patient.  MONITORING & EVALUATION Dietary intake, weekly physical activity, and weight/blood sugars in 1 month.  Next Steps  Patient is to work on meal planning and choosing more plant based foods.Marland Kitchen

## 2023-04-05 NOTE — Patient Instructions (Signed)
Keep up great job. Talk to MD about reducing Jardiance to 10 mg a day instead of 25 mg. Ask about reducing Metformin to 500 mg BID since its ER Metformin.

## 2023-04-06 ENCOUNTER — Encounter: Payer: Self-pay | Admitting: Nutrition

## 2023-04-20 DIAGNOSIS — R300 Dysuria: Secondary | ICD-10-CM | POA: Diagnosis not present

## 2023-05-19 NOTE — Telephone Encounter (Signed)
Lets have patient follow-up in September/October and we can see how she is doing in regards to her diabetes and discuss rescheduling her EGD and colonoscopy. She will need to have BG maintained <250 consistently before scheduling.   Brooke Bonito, MSN, APRN, FNP-BC, AGACNP-BC [facility] at [facility]

## 2023-05-25 DIAGNOSIS — E1165 Type 2 diabetes mellitus with hyperglycemia: Secondary | ICD-10-CM | POA: Diagnosis not present

## 2023-05-25 DIAGNOSIS — I10 Essential (primary) hypertension: Secondary | ICD-10-CM | POA: Diagnosis not present

## 2023-05-25 LAB — COMPREHENSIVE METABOLIC PANEL WITH GFR: eGFR: 77.7

## 2023-05-25 LAB — BASIC METABOLIC PANEL WITH GFR
BUN: 10 (ref 4–21)
Creatinine: 0.8 (ref 0.5–1.1)
Glucose: 214

## 2023-05-25 LAB — HEMOGLOBIN A1C: Hemoglobin A1C: 9.8

## 2023-06-01 DIAGNOSIS — I10 Essential (primary) hypertension: Secondary | ICD-10-CM | POA: Diagnosis not present

## 2023-06-01 DIAGNOSIS — Z6826 Body mass index (BMI) 26.0-26.9, adult: Secondary | ICD-10-CM | POA: Diagnosis not present

## 2023-06-01 DIAGNOSIS — F419 Anxiety disorder, unspecified: Secondary | ICD-10-CM | POA: Diagnosis not present

## 2023-06-01 DIAGNOSIS — F17210 Nicotine dependence, cigarettes, uncomplicated: Secondary | ICD-10-CM | POA: Diagnosis not present

## 2023-06-01 DIAGNOSIS — E7849 Other hyperlipidemia: Secondary | ICD-10-CM | POA: Diagnosis not present

## 2023-06-01 DIAGNOSIS — E1165 Type 2 diabetes mellitus with hyperglycemia: Secondary | ICD-10-CM | POA: Diagnosis not present

## 2023-06-27 ENCOUNTER — Encounter: Payer: Self-pay | Admitting: Gastroenterology

## 2023-06-27 ENCOUNTER — Ambulatory Visit (INDEPENDENT_AMBULATORY_CARE_PROVIDER_SITE_OTHER): Payer: Medicare HMO | Admitting: Gastroenterology

## 2023-06-27 VITALS — BP 137/68 | HR 80 | Temp 97.7°F | Ht 60.0 in | Wt 132.8 lb

## 2023-06-27 DIAGNOSIS — R140 Abdominal distension (gaseous): Secondary | ICD-10-CM | POA: Diagnosis not present

## 2023-06-27 DIAGNOSIS — K649 Unspecified hemorrhoids: Secondary | ICD-10-CM

## 2023-06-27 DIAGNOSIS — Z860100 Personal history of colon polyps, unspecified: Secondary | ICD-10-CM

## 2023-06-27 DIAGNOSIS — K5900 Constipation, unspecified: Secondary | ICD-10-CM

## 2023-06-27 DIAGNOSIS — R5383 Other fatigue: Secondary | ICD-10-CM

## 2023-06-27 DIAGNOSIS — R1310 Dysphagia, unspecified: Secondary | ICD-10-CM | POA: Diagnosis not present

## 2023-06-27 DIAGNOSIS — K219 Gastro-esophageal reflux disease without esophagitis: Secondary | ICD-10-CM

## 2023-06-27 NOTE — Progress Notes (Unsigned)
GI Office Note    Referring Provider: Lawerance Sabal, [place] Primary Care Physician:  Lawerance Sabal, [place] Primary Gastroenterologist: Gerrit Friends.Rourk, MD   Date:  06/27/2023  ID:  Andrea Herrera, DOB 1951-09-18, MRN [id number]  Chief Complaint   Chief Complaint  Patient presents with   Follow-up    Colonoscopy screening. Bowels are not moving often every couple of days. Lots of gas and some leakage of stool.    History of Present Illness  Andrea Herrera is a 72 y.o. female with a history of diabetes, HTN, CAD, MI in 2015 with stent placement, HLD, GERD, constipation presenting today to follow-up regarding rescheduling colonoscopy and follow-up on gas and constipation.  Colonoscopy in December 2019 normal.  Advised repeat in 5 years.   Initial office visit 11/23/2022.  Having difficulty with swallowing as well as a burning sensation in her stomach.  Using Mylanta as needed.  Usually occurs with sausage, spaghetti sauce, pizza, and some other meats.  Also noted again for collicular and having nasty burps.  Hydron with constipation, using laxative every 2 weeks or once per month due to hard stools.  Usually has a bowel movement every 3 days and does have to strain.  Tried senna which was not effective.  Had not tried any MiraLAX. Advised Colace daily, MiraLAX daily.  Start pantoprazole 40 mg once daily.  Scheduled for EGD with dilation and colonoscopy.  Check iron and CBC.   Labs 11/24/2022: Hemoglobin 11.7, platelets 281, normal iron panel.   Patient scheduled for EGD/ED/TCS for 03/04/2023.  Procedures were canceled given her blood sugar was elevated at 398.  She was advised that she will need to maintain blood sugar less than 250 consistently prior to scheduling.  Last office visit 02/21/2023.  Constipation was doing very well with Colace daily or every other day as well as MiraLAX nightly.  Was continuing to have some issues with swallowing feel like food stuck in her lower chest.  Reflux had  improved with pantoprazole 40 mg once daily.  Continuing to experience fatigue despite normalized labs in February.  Labs May 2024: Glucose 220, normal kidney and liver function.  Hemoglobin normal at 12.2.  Today:  Has been having issues with hemorrhoids - her hemorrhoids are coming out. Has a lot of foul smelling gas. Has been having issues with constipation and having to push on her buttons to get it to come out. Is taking miralax every other day and on the 3rd day she can't go. Does not have pain in her abdomen. She has a lot of urgency. Has had some smearing and incomplete emptying. She will go when she takes colace and miralax.   Food still feels like it is getting stuck at times. Sometimes will happen with liquids if she drinks too fast.   Ate raisin bran this morning and had a half cup of coffee. At night time her stomach feels bubbly. Has bene trying to follow a certain diet given to her by her endocrinologist.   Pantoprazole is helping. Still has some foul smelling burps as well.   Blood glucose has been regularly less than 200. Has been on Jardiance. Lost her meter.   Does report a lot of stress in her life currently.   Current Outpatient Medications  Medication Sig Dispense Refill   acetaminophen (TYLENOL) 650 MG CR tablet Take 650 mg by mouth 2 (two) times daily as needed for pain.     amLODipine (NORVASC) 10 MG tablet Take 1  tablet by mouth once daily 90 tablet 1   aspirin EC 81 MG tablet Take 1 tablet (81 mg total) by mouth daily. 30 tablet 3   atorvastatin (LIPITOR) 80 MG tablet TAKE 1 TABLET BY MOUTH ONCE DAILY AT  6  PM 90 tablet 3   cholecalciferol (VITAMIN D) 1000 UNITS tablet Take 1,000 Units by mouth in the morning.     empagliflozin (JARDIANCE) 25 MG TABS tablet Take 10 mg by mouth daily.     Ferrous Sulfate (IRON PO) Take 1 tablet by mouth daily.     gabapentin (NEURONTIN) 100 MG capsule Take 100 mg by mouth in the morning.     glipiZIDE (GLUCOTROL XL) 5 MG 24 hr  tablet Take 5 mg by mouth daily.     indomethacin (INDOCIN) 25 MG capsule Take 25 mg by mouth daily as needed (pain.).     lisinopril (ZESTRIL) 5 MG tablet Take 1 tablet (5 mg total) by mouth daily. 30 tablet 6   metFORMIN (GLUCOPHAGE-XR) 500 MG 24 hr tablet Take 1,000 mg by mouth 2 (two) times daily with a meal.     metoprolol tartrate (LOPRESSOR) 25 MG tablet Take 1 tablet by mouth twice daily 180 tablet 1   Multiple Vitamin (MULTIVITAMIN) tablet Take 1 tablet by mouth in the morning.     nitroGLYCERIN (NITROSTAT) 0.4 MG SL tablet Place 1 tablet (0.4 mg total) under the tongue every 5 (five) minutes x 3 doses as needed for chest pain. 25 tablet 3   Omega-3 Fatty Acids (FISH OIL PO) Take 1 capsule by mouth 2 (two) times a week. In the morning.     pantoprazole (PROTONIX) 40 MG tablet Take 1 tablet by mouth once daily 30 tablet 5   POTASSIUM PO Take 99 mg by mouth every other day. In the morning.     polyethylene glycol-electrolytes (NULYTELY) 420 g solution Take 4,000 mLs by mouth once. (Patient not taking: Reported on 06/27/2023)     No current facility-administered medications for this visit.    Past Medical History:  Diagnosis Date   Anemia    CAD (coronary artery disease)    Hx of admission in 2016 for inferior STEMI. Received 2 DES to RCA. EF was normal.   Diabetes mellitus without complication (HCC)    HLD (hyperlipidemia)    Hypertension     Past Surgical History:  Procedure Laterality Date   CARDIAC CATHETERIZATION N/A 04/26/2015   Procedure: Left Heart Cath and Coronary Angiography;  Surgeon: Runell Gess, MD;  Location: [facility] INVASIVE CV LAB;  Service: Cardiovascular;  Laterality: N/A;   cardiac stents  2016   x 2    Family History  Problem Relation Age of Onset   Colon cancer Mother    Lung cancer Father    Stroke Father    Pancreatic cancer Brother    Heart disease Paternal Grandmother    Stroke Paternal Grandmother    Heart disease Paternal Grandfather    Stroke  Paternal Grandfather     Allergies as of 06/27/2023   (No Known Allergies)    Social History   Socioeconomic History   Marital status: Divorced    Spouse name: Not on file   Number of children: Not on file   Years of education: Not on file   Highest education level: Not on file  Occupational History   Not on file  Tobacco Use   Smoking status: Former    Current packs/day: 0.00    Average  packs/day: 0.3 packs/day for 27.6 years (6.9 ttl pk-yrs)    Types: Cigarettes    Start date: 09/11/1987    Quit date: 04/21/2015    Years since quitting: 8.1    Passive exposure: Never   Smokeless tobacco: Never  Vaping Use   Vaping status: Never Used  Substance and Sexual Activity   Alcohol use: No    Alcohol/week: 0.0 standard drinks of alcohol   Drug use: No   Sexual activity: Not on file  Other Topics Concern   Not on file  Social History Narrative   Not on file   Social Determinants of Health   Financial Resource Strain: Not on file  Food Insecurity: Not on file  Transportation Needs: Not on file  Physical Activity: Not on file  Stress: Not on file  Social Connections: Not on file     Review of Systems   Gen: Denies fever, chills, anorexia. Denies fatigue, weakness, weight loss.  CV: Denies chest pain, palpitations, syncope, peripheral edema, and claudication. Resp: Denies dyspnea at rest, cough, wheezing, coughing up blood, and pleurisy. GI: See HPI Derm: Denies rash, itching, dry skin Psych: Denies depression, anxiety, memory loss, confusion. No homicidal or suicidal ideation.  Heme: Denies bruising, bleeding, and enlarged lymph nodes.   Physical Exam   BP 137/68 (BP Location: Right Arm, Patient Position: Sitting, Cuff Size: Normal)   Pulse 80   Temp 97.7 F (36.5 C) (Temporal)   Ht 5' (1.524 m)   Wt 132 lb 12.8 oz (60.2 kg)   SpO2 99%   BMI 25.94 kg/m   General:   Alert and oriented. No distress noted. Pleasant and cooperative.  Head:  Normocephalic and  atraumatic. Eyes:  Conjuctiva clear without scleral icterus. Mouth:  Oral mucosa pink and moist. Good dentition. No lesions. Lungs:  Clear to auscultation bilaterally. No wheezes, rales, or rhonchi. No distress.  Heart:  S1, S2 present without murmurs appreciated.  Abdomen:  +BS, soft, non-tender and non-distended. No rebound or guarding. No HSM or masses noted. Rectal: deferred Msk:  Symmetrical without gross deformities. Normal posture. Extremities:  Without edema. Neurologic:  Alert and  oriented x4 Psych:  Alert and cooperative. Normal mood and affect.   Assessment  Andrea Herrera is a 72 y.o. female with a history of diabetes, HTN, CAD, MI in 2015 with stent placement, HLD, GERD, constipation presenting today to follow-up regarding rescheduling colonoscopy and follow-up on gas and constipation.  Constipation, gassiness, hemorrhoids: Doing to have issues with constipation.  Feels like she is having to squeeze her gluteal muscles tightly or push on the area to help stool come out.  Taking MiraLAX every other day but even with this she sometimes is not able to go even on 3rd day.  Denies any abdominal pain.  Continues to have some urgency and also incomplete emptying and sometimes some smearing in her underwear.  She states usually when she takes MiraLAX and Colace together she usually will go.  Biggest complaint has been the bloating and gassiness that she has been experiencing.  She states her flatulence has been foul-smelling.  Advised to increase MiraLAX to daily in her coffee as well as continue stool softener daily.  Will try Phazyme and Gas-X as needed for relief.  Discussed foods to avoid.  Discussed potential hemorrhoid banding for her hemorrhoids after procedures are performed.  Continue to advise Preparation H as needed currently.  GERD, epigastric pain, dysphagia: Continues to feel like food is getting stuck at times.  Can happen with liquids as well if she drinks some too fast.   Continues to have some mild epigastric tenderness but no overt pain.  Pantoprazole has been effective with helping with this however she still is having some foul-smelling burps at times.  Decision made to proceed with upper endoscopy with possible dilation given her dysphagia.  She will continue on PPI daily.  GERD diet reinforced.  History of colon polyps: Last colonoscopy in December 2019 at [facility] was normal.  She did report a history of colon polyps about 10-12 years ago.  Colonoscopy was scheduled for earlier this summer however had to be postponed given patient's elevated blood sugars.  Blood sugars are now under much better control and she is following with endocrinology we will proceed with scheduling colonoscopy.  Fatigue: Continues to experience fatigue however hemoglobin has remained normal.  No evidence of anemia on labs from February.  Even though anemia has resolved, we are still pursuing upper endoscopy and colonoscopy for evaluation of GERD and her surveillance for colon polyps.  PLAN   Proceed with upper endoscopy with dilation and colonoscopy propofol by Dr. Jena Gauss in near future: the risks, benefits, and alternatives have been discussed with the patient in detail. The patient states understanding and desires to proceed. ASA 3 Hold iron for 1 week Hold metformin night prior to and morning of Hold glipizide morning of Hod Jardiance for 3 days.  Continue pantoprazole 40 mg once daily. GERD diet Continue miralax - increase to daily in coffee Continue colace daily Phazyme/gas ex as needed Avoid gas producing foods. Continue using over-the-counter Preparation H or hydrocortisone as needed for hemorrhoids. Hemorrhoid banding pamphlet provided. Follow up in 3-4 months.   Brooke Bonito, MSN, FNP-BC, AGACNP-BC [facility]

## 2023-06-27 NOTE — Patient Instructions (Addendum)
Avoid gas-producing foods (eg, garlic, dairy, cabbage, legumes, onions, broccoli, brussel sprouts, wheat, and potatoes). Try avoiding one thing at a time for 1-2 weeks and see if symptoms improve.  You can reintroduce that type of food if it does not improve your symptoms.  You may use Phazyme over-the-counter to help with gas relief while we work toward getting your constipation under better control.   Continue taking pantoprazole 40 mg once daily. Follow a GERD diet:  Avoid fried, fatty, greasy, spicy, citrus foods. Avoid caffeine and carbonated beverages. Avoid chocolate. Try eating 4-6 small meals a day rather than 3 large meals. Do not eat within 3 hours of laying down. Prop head of bed up on wood or bricks to create a 6 inch incline.  For your constipation I want you to start taking MiraLAX every single day.  If it is best for you to remember in the mornings that I would recommend you adding it to your coffee.  Consistency is the key with MiraLAX.  Continue Colace once daily.  You can continue over-the-counter Preparation H or rectal hydrocortisone as needed for your hemorrhoids.  Primarily use hydrocortisone if your main complaint is itching.  We will get you rescheduled for your upper endoscopy with dilation and colonoscopy in the near future with Dr. Jena Gauss.  They will give you a call to get you scheduled after we have the October schedule.  Please please let us know if you have any changes in your diabetes medication prior to your procedure.  I will see you in about 3-4 months for follow-up.  It was a pleasure to see you today. I want to create trusting relationships with patients. If you receive a survey regarding your visit,  I greatly appreciate you taking time to fill this out on paper or through your MyChart. I value your feedback.  Brooke Bonito, MSN, FNP-BC, AGACNP-BC [facility]

## 2023-07-04 ENCOUNTER — Telehealth: Payer: Self-pay | Admitting: *Deleted

## 2023-07-04 NOTE — Telephone Encounter (Signed)
LMOVM to call back to schedule TCS/EGD/ED with Dr. Jena Gauss ASA 3 in October. Stop Jardiance 3 days prior, stop iron 1 week prior, miralax twice daily x 5 days prior, no metformin night prior or day of, no glipizide morning of

## 2023-07-06 ENCOUNTER — Encounter: Payer: Self-pay | Admitting: *Deleted

## 2023-07-06 NOTE — Telephone Encounter (Signed)
Spoke with pt. She has been scheduled for 10/30. Aware will send instructions/pre-op appt. Pt already has prep at home.  PA submitted via cohere. Approved Auth# [id number], DOS: 08/17/2023 - 10/17/2023

## 2023-07-23 ENCOUNTER — Other Ambulatory Visit: Payer: Self-pay | Admitting: Cardiology

## 2023-07-26 ENCOUNTER — Ambulatory Visit: Payer: Medicare HMO | Attending: Cardiology | Admitting: Cardiology

## 2023-07-26 ENCOUNTER — Encounter: Payer: Self-pay | Admitting: Cardiology

## 2023-07-26 VITALS — BP 120/86 | HR 75 | Ht 60.0 in | Wt 134.6 lb

## 2023-07-26 DIAGNOSIS — E782 Mixed hyperlipidemia: Secondary | ICD-10-CM | POA: Diagnosis not present

## 2023-07-26 DIAGNOSIS — I10 Essential (primary) hypertension: Secondary | ICD-10-CM

## 2023-07-26 DIAGNOSIS — I2510 Atherosclerotic heart disease of native coronary artery without angina pectoris: Secondary | ICD-10-CM | POA: Diagnosis not present

## 2023-07-26 NOTE — Progress Notes (Signed)
Clinical Summary Andrea Herrera is a 72 y.o.female seen today for follow up of the following medical problems.    1. CAD - admit 04/2015 with inferior STEMI, s/p DES x 2 to RCA. LVEF 55-60% by echo   06/2020 echo LVEF 60-65%, indet DDx, normal RV function      04/2022 nuclear stress: no ischemia. - denies any chest pains. Some SOB at times, can occur with talking or singing. Some SOB with housework. Symptoms overall chronic and stable - of note prior PFTs were benign in 2016        2. HTN - compliant with meds     3. Hyperlipidemia - she is on atorvastatin 80mg  daily   03/2021 TC 99 TG 60 HDL 56 LDL 31 08/2021 TC 84 TG 77 HDL 46 LDL 22 - reports more recent labs with pcp Past Medical History:  Diagnosis Date   Anemia    CAD (coronary artery disease)    Hx of admission in 2016 for inferior STEMI. Received 2 DES to RCA. EF was normal.   Diabetes mellitus without complication (HCC)    HLD (hyperlipidemia)    Hypertension      No Known Allergies   Current Outpatient Medications  Medication Sig Dispense Refill   acetaminophen (TYLENOL) 650 MG CR tablet Take 650 mg by mouth 2 (two) times daily as needed for pain.     amLODipine (NORVASC) 10 MG tablet Take 1 tablet by mouth once daily 90 tablet 1   aspirin EC 81 MG tablet Take 1 tablet (81 mg total) by mouth daily. 30 tablet 3   atorvastatin (LIPITOR) 80 MG tablet TAKE 1 TABLET BY MOUTH ONCE DAILY AT  6  PM 90 tablet 3   cholecalciferol (VITAMIN D) 1000 UNITS tablet Take 1,000 Units by mouth in the morning.     empagliflozin (JARDIANCE) 25 MG TABS tablet Take 10 mg by mouth daily.     Ferrous Sulfate (IRON PO) Take 1 tablet by mouth daily.     gabapentin (NEURONTIN) 100 MG capsule Take 100 mg by mouth in the morning.     glipiZIDE (GLUCOTROL XL) 5 MG 24 hr tablet Take 5 mg by mouth daily.     indomethacin (INDOCIN) 25 MG capsule Take 25 mg by mouth daily as needed (pain.).     lisinopril (ZESTRIL) 5 MG tablet Take 1  tablet (5 mg total) by mouth daily. 30 tablet 6   metFORMIN (GLUCOPHAGE-XR) 500 MG 24 hr tablet Take 1,000 mg by mouth 2 (two) times daily with a meal.     metoprolol tartrate (LOPRESSOR) 25 MG tablet Take 1 tablet by mouth twice daily 180 tablet 1   Multiple Vitamin (MULTIVITAMIN) tablet Take 1 tablet by mouth in the morning.     nitroGLYCERIN (NITROSTAT) 0.4 MG SL tablet Place 1 tablet (0.4 mg total) under the tongue every 5 (five) minutes x 3 doses as needed for chest pain. 25 tablet 3   Omega-3 Fatty Acids (FISH OIL PO) Take 1 capsule by mouth 2 (two) times a week. In the morning.     pantoprazole (PROTONIX) 40 MG tablet Take 1 tablet by mouth once daily 30 tablet 5   polyethylene glycol-electrolytes (NULYTELY) 420 g solution Take 4,000 mLs by mouth once. (Patient not taking: Reported on 06/27/2023)     POTASSIUM PO Take 99 mg by mouth every other day. In the morning.     No current facility-administered medications for this visit.  Past Surgical History:  Procedure Laterality Date   CARDIAC CATHETERIZATION N/A 04/26/2015   Procedure: Left Heart Cath and Coronary Angiography;  Surgeon: Runell Gess, MD;  Location: [facility] INVASIVE CV LAB;  Service: Cardiovascular;  Laterality: N/A;   cardiac stents  2016   x 2     No Known Allergies    Family History  Problem Relation Age of Onset   Colon cancer Mother    Lung cancer Father    Stroke Father    Pancreatic cancer Brother    Heart disease Paternal Grandmother    Stroke Paternal Grandmother    Heart disease Paternal Grandfather    Stroke Paternal Grandfather      Social History Andrea Herrera reports that she quit smoking about 8 years ago. Her smoking use included cigarettes. She started smoking about 35 years ago. She has a 6.9 pack-year smoking history. She has never been exposed to tobacco smoke. She has never used smokeless tobacco. Andrea Herrera reports no history of alcohol use.   Review of Systems CONSTITUTIONAL: No  weight loss, fever, chills, weakness or fatigue.  HEENT: Eyes: No visual loss, blurred vision, double vision or yellow sclerae.No hearing loss, sneezing, congestion, runny nose or sore throat.  SKIN: No rash or itching.  CARDIOVASCULAR: per hpi RESPIRATORY: No shortness of breath, cough or sputum.  GASTROINTESTINAL: No anorexia, nausea, vomiting or diarrhea. No abdominal pain or blood.  GENITOURINARY: No burning on urination, no polyuria NEUROLOGICAL: No headache, dizziness, syncope, paralysis, ataxia, numbness or tingling in the extremities. No change in bowel or bladder control.  MUSCULOSKELETAL: No muscle, back pain, joint pain or stiffness.  LYMPHATICS: No enlarged nodes. No history of splenectomy.  PSYCHIATRIC: No history of depression or anxiety.  ENDOCRINOLOGIC: No reports of sweating, cold or heat intolerance. No polyuria or polydipsia.  Marland Kitchen   Physical Examination Today's Vitals   07/26/23 1605  BP: 120/86  Pulse: 75  SpO2: 96%  Weight: 134 lb 9.6 oz (61.1 kg)  Height: 5' (1.524 m)   Body mass index is 26.29 kg/m.  Gen: resting comfortably, no acute distress HEENT: no scleral icterus, pupils equal round and reactive, no palptable cervical adenopathy,  CV: RRR, no mrg, no jvd Resp: Clear to auscultation bilaterally GI: abdomen is soft, non-tender, non-distended, normal bowel sounds, no hepatosplenomegaly MSK: extremities are warm, no edema.  Skin: warm, no rash Neuro:  no focal deficits Psych: appropriate affect   Diagnostic Studies  04/2015 Cath Mid RCA lesion, 99% stenosed. There is a 0% residual stenosis post intervention. Prox RCA to Mid RCA lesion, 75% stenosed. There is a 0% residual stenosis post intervention. The left ventricular systolic function is normal.       04/2015 Echo - Left ventricle: The cavity size was normal. Systolic function was   normal. The estimated ejection fraction was in the range of 55%   to 60%. Wall motion was normal; there were no  regional wall   motion abnormalities. Doppler parameters are consistent with   abnormal left ventricular relaxation (grade 1 diastolic   dysfunction).   09/2015 PFTs No COPD   05/2019 ABIs Summary:  Right: Resting right ankle-brachial index is within normal range. No  evidence of significant right lower extremity arterial disease. The right  toe-brachial index is normal.   Left: Resting left ankle-brachial index is within normal range. No  evidence of significant left lower extremity arterial disease. The left  toe-brachial index is normal.    06/2020 echo IMPRESSIONS  1. Left ventricular ejection fraction, by estimation, is 60 to 65%. The  left ventricle has normal function. The left ventricle has no regional  wall motion abnormalities. Left ventricular diastolic parameters are  indeterminate.   2. Right ventricular systolic function is normal. The right ventricular  size is normal. There is normal pulmonary artery systolic pressure. The  estimated right ventricular systolic pressure is 23.4 mmHg.   3. The mitral valve is grossly normal. Trivial mitral valve  regurgitation.   4. The aortic valve is tricuspid. Aortic valve regurgitation is not  visualized. Mild aortic valve sclerosis is present, with no evidence of  aortic valve stenosis.   5. The inferior vena cava is normal in size with greater than 50%  respiratory variability, suggesting right atrial pressure of 3 mmHg.      Assessment and Plan   1.CAD - no symptoms, continue current meds -EKG today shows NSR, no acute ischemic changes    2. HTN - at goal, continue current meds   3.Hyperlipidemia - request pcp labs, continue current meds  F/u 6 months     Antoine Poche, M.D.

## 2023-07-26 NOTE — Patient Instructions (Addendum)
Medication Instructions:  Continue all current medications.   Labwork: none  Testing/Procedures: none  Follow-Up: 6 months   Any Other Special Instructions Will Be Listed Below (If Applicable).   If you need a refill on your cardiac medications before your next appointment, please call your pharmacy.  

## 2023-07-27 ENCOUNTER — Encounter: Payer: Self-pay | Admitting: *Deleted

## 2023-07-31 ENCOUNTER — Other Ambulatory Visit: Payer: Self-pay | Admitting: Gastroenterology

## 2023-08-02 ENCOUNTER — Encounter: Payer: Medicare HMO | Attending: Family Medicine | Admitting: Nutrition

## 2023-08-02 VITALS — Ht 60.0 in | Wt 132.0 lb

## 2023-08-02 DIAGNOSIS — E118 Type 2 diabetes mellitus with unspecified complications: Secondary | ICD-10-CM | POA: Diagnosis not present

## 2023-08-02 DIAGNOSIS — I252 Old myocardial infarction: Secondary | ICD-10-CM | POA: Insufficient documentation

## 2023-08-02 DIAGNOSIS — I10 Essential (primary) hypertension: Secondary | ICD-10-CM | POA: Insufficient documentation

## 2023-08-02 DIAGNOSIS — E785 Hyperlipidemia, unspecified: Secondary | ICD-10-CM | POA: Insufficient documentation

## 2023-08-02 NOTE — Progress Notes (Signed)
Medical Nutrition Therapy Dm Follow up Appointment Start time:  1100  Appointment End time:  1130   Primary concerns today: DM Type 2  Referral diagnosis: E11.8 Preferred learning style: No preference. Learning readiness: Ready   NUTRITION ASSESSMENT  DM Follow up Changes made:   Has had some constipation. Went to GI and took some miralax and Colace.  Trying to get to the [facility] Reduced Jardiance to 10mg  a day now.Taking 500 mg BI of Metformin now and Glipizide FBS: 150's .  Has an appt with PCP within the next month.    Previous Goals Established by Pt  Check blood sugar before breakfast and before bid-working on it. Take Metformin after breakfast- reduce to 500 mg BID isntead of 1000 mg BID since it is ER. Increase low carb vegetables-done Don't skip meals -done  Anthropometrics  Wt Readings from Last 3 Encounters:  07/26/23 134 lb 9.6 oz (61.1 kg)  06/27/23 132 lb 12.8 oz (60.2 kg)  04/05/23 129 lb (58.5 kg)   Ht Readings from Last 3 Encounters:  07/26/23 5' (1.524 m)  06/27/23 5' (1.524 m)  04/05/23 5' (1.524 m)   There is no height or weight on file to calculate BMI. @BMIFA @ Facility age limit for growth %iles is 20 years. Facility age limit for growth %iles is 20 years.    Clinical Medical Hx: HA 2015, Type 2 DM Medications: Metformin 500 mg BID, Glipizide 5 mg a day. Had been increased Linsinopril to 40 mg by cardiology   Labs:  Lab Results  Component Value Date   HGBA1C 6.7 (H) 04/27/2015      Latest Ref Rng & Units 03/01/2023    9:59 AM 03/23/2021    9:50 AM 04/26/2015    8:46 AM  CMP  Glucose 70 - 99 mg/dL 161  096  045   BUN 8 - 23 mg/dL 15  7  <5   Creatinine 0.44 - 1.00 mg/dL 4.09  8.11  9.14   Sodium 135 - 145 mmol/L 136  137  138   Potassium 3.5 - 5.1 mmol/L 4.2  4.3  3.7   Chloride 98 - 111 mmol/L 100  101  103   CO2 22 - 32 mmol/L 26  27  26    Calcium 8.9 - 10.3 mg/dL 9.8  9.6  9.4     Notable Signs/Symptoms: Blurry vision  Lifestyle &  Dietary Hx Lives with her son. Works PT for [employer] in [place].  Estimated daily fluid intake: 40 oz Supplements: MVI, VIt D Sleep: 8/9 hrs  Stress / self-care: none Current average weekly physical activity: Cardiac rehab exercises daily.  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   NUTRITION INTERVENTION  Nutrition education (E-1) on the following topics:  Nutrition and Diabetes education provided on My Plate, CHO counting, meal planning, portion sizes, timing of meals, avoiding snacks between meals unless having a low blood sugar, target ranges for A1C and blood sugars, signs/symptoms and treatment of hyper/hypoglycemia, monitoring blood sugars, taking medications as prescribed, benefits of exercising 30 minutes per day and prevention of complications of DM. Low Cholesterol, high fiber diet  Handouts Provided Include  Plant based plate method   Learning Style & Readiness for Change Teaching method utilized: Visual & Auditory  Demonstrated degree of understanding via: Teach Back  Barriers to learning/adherence to lifestyle change: none   Goals Increase fruits and vegetables and high fiber foods Cut down on applesauce and bananas due to constipation Add greens/vegetables with lunch and dinner Get A1C checked in November   Education:   The following Lifestyle Medicine recommendations according to American College of Lifestyle Medicine  [facility]) were discussed and and offered to patient and she  agrees to start the journey:  A. Whole Foods, Plant-Based Nutrition comprising of fruits and  vegetables, plant-based proteins, whole-grain carbohydrates was discussed in detail with the patient.   A list for source of those nutrients were also provided to the patient.  Patient will use only water or unsweetened tea for hydration. B.  The need to stay away from risky substances including alcohol, smoking; obtaining 7 to 9 hours of restorative sleep, at least 150 minutes of moderate intensity exercise weekly, the importance of healthy social connections,  and stress management techniques were discussed. C.  A full color page of  Calorie density of various food groups per pound showing examples of each food groups was provided to the patient.  MONITORING & EVALUATION Dietary intake, weekly physical activity, and weight/blood sugars in 1 month.  Next Steps  Patient is to work on meal planning and choosing more plant based foods.Marland Kitchen

## 2023-08-02 NOTE — Patient Instructions (Signed)
Increase fruits and vegetables and high fiber foods Cut down on applesauce and bananas due to constipation Add greens/vegetables with lunch and dinner Get A1C checked in November

## 2023-08-15 ENCOUNTER — Encounter (HOSPITAL_COMMUNITY)
Admission: RE | Admit: 2023-08-15 | Discharge: 2023-08-15 | Disposition: A | Discharge status: Home / Self Care | Payer: Medicare HMO | Source: Ambulatory Visit | Attending: Internal Medicine | Admitting: Internal Medicine

## 2023-08-15 ENCOUNTER — Encounter (HOSPITAL_COMMUNITY): Payer: Self-pay

## 2023-08-15 ENCOUNTER — Encounter (HOSPITAL_COMMUNITY): Payer: Self-pay | Admitting: Internal Medicine

## 2023-08-15 HISTORY — DX: Acute myocardial infarction, unspecified: I21.9

## 2023-08-15 HISTORY — DX: Unspecified osteoarthritis, unspecified site: M19.90

## 2023-08-15 HISTORY — DX: Gastro-esophageal reflux disease without esophagitis: K21.9

## 2023-08-17 ENCOUNTER — Encounter (HOSPITAL_COMMUNITY): Payer: Self-pay | Admitting: Internal Medicine

## 2023-08-17 ENCOUNTER — Ambulatory Visit (HOSPITAL_COMMUNITY): Payer: Medicare HMO | Admitting: Anesthesiology

## 2023-08-17 ENCOUNTER — Other Ambulatory Visit: Payer: Self-pay

## 2023-08-17 ENCOUNTER — Encounter (HOSPITAL_COMMUNITY)
Admission: RE | Disposition: A | Discharge status: Home / Self Care | Payer: Self-pay | Source: Home / Self Care | Attending: Internal Medicine

## 2023-08-17 ENCOUNTER — Ambulatory Visit (HOSPITAL_COMMUNITY)
Admission: RE | Admit: 2023-08-17 | Discharge: 2023-08-17 | Disposition: A | Discharge status: Home / Self Care | Payer: Medicare HMO | Attending: Internal Medicine | Admitting: Internal Medicine

## 2023-08-17 ENCOUNTER — Telehealth: Payer: Self-pay

## 2023-08-17 DIAGNOSIS — D649 Anemia, unspecified: Secondary | ICD-10-CM | POA: Insufficient documentation

## 2023-08-17 DIAGNOSIS — Z860101 Personal history of adenomatous and serrated colon polyps: Secondary | ICD-10-CM | POA: Insufficient documentation

## 2023-08-17 DIAGNOSIS — D122 Benign neoplasm of ascending colon: Secondary | ICD-10-CM | POA: Diagnosis not present

## 2023-08-17 DIAGNOSIS — K635 Polyp of colon: Secondary | ICD-10-CM | POA: Diagnosis not present

## 2023-08-17 DIAGNOSIS — K219 Gastro-esophageal reflux disease without esophagitis: Secondary | ICD-10-CM | POA: Diagnosis not present

## 2023-08-17 DIAGNOSIS — Z79899 Other long term (current) drug therapy: Secondary | ICD-10-CM | POA: Diagnosis not present

## 2023-08-17 DIAGNOSIS — E785 Hyperlipidemia, unspecified: Secondary | ICD-10-CM | POA: Insufficient documentation

## 2023-08-17 DIAGNOSIS — Z860100 Personal history of colon polyps, unspecified: Secondary | ICD-10-CM

## 2023-08-17 DIAGNOSIS — E119 Type 2 diabetes mellitus without complications: Secondary | ICD-10-CM | POA: Diagnosis not present

## 2023-08-17 DIAGNOSIS — Z7982 Long term (current) use of aspirin: Secondary | ICD-10-CM | POA: Insufficient documentation

## 2023-08-17 DIAGNOSIS — Z7984 Long term (current) use of oral hypoglycemic drugs: Secondary | ICD-10-CM | POA: Diagnosis not present

## 2023-08-17 DIAGNOSIS — R1310 Dysphagia, unspecified: Secondary | ICD-10-CM

## 2023-08-17 DIAGNOSIS — Z1211 Encounter for screening for malignant neoplasm of colon: Secondary | ICD-10-CM | POA: Diagnosis not present

## 2023-08-17 DIAGNOSIS — I10 Essential (primary) hypertension: Secondary | ICD-10-CM | POA: Diagnosis not present

## 2023-08-17 DIAGNOSIS — R1314 Dysphagia, pharyngoesophageal phase: Secondary | ICD-10-CM | POA: Insufficient documentation

## 2023-08-17 DIAGNOSIS — Z87891 Personal history of nicotine dependence: Secondary | ICD-10-CM | POA: Diagnosis not present

## 2023-08-17 DIAGNOSIS — I252 Old myocardial infarction: Secondary | ICD-10-CM | POA: Insufficient documentation

## 2023-08-17 DIAGNOSIS — K5730 Diverticulosis of large intestine without perforation or abscess without bleeding: Secondary | ICD-10-CM | POA: Diagnosis not present

## 2023-08-17 DIAGNOSIS — I2510 Atherosclerotic heart disease of native coronary artery without angina pectoris: Secondary | ICD-10-CM | POA: Diagnosis not present

## 2023-08-17 HISTORY — PX: ESOPHAGOGASTRODUODENOSCOPY (EGD) WITH PROPOFOL: SHX5813

## 2023-08-17 HISTORY — PX: SUBMUCOSAL TATTOO INJECTION: SHX6856

## 2023-08-17 HISTORY — PX: MALONEY DILATION: SHX5535

## 2023-08-17 HISTORY — PX: SCLEROTHERAPY: SHX6841

## 2023-08-17 HISTORY — PX: POLYPECTOMY: SHX5525

## 2023-08-17 HISTORY — PX: COLONOSCOPY WITH PROPOFOL: SHX5780

## 2023-08-17 LAB — GLUCOSE, CAPILLARY: Glucose-Capillary: 188 mg/dL — ABNORMAL HIGH (ref 70–99)

## 2023-08-17 SURGERY — COLONOSCOPY WITH PROPOFOL
Anesthesia: General

## 2023-08-17 MED ORDER — PROPOFOL 10 MG/ML IV BOLUS
INTRAVENOUS | Status: DC | PRN
  Administered 2023-08-17: 70 mg via INTRAVENOUS
  Administered 2023-08-17: 50 mg via INTRAVENOUS
  Administered 2023-08-17: 30 mg via INTRAVENOUS
  Administered 2023-08-17: 50 mg via INTRAVENOUS

## 2023-08-17 MED ORDER — PROPOFOL 500 MG/50ML IV EMUL
INTRAVENOUS | Status: DC | PRN
  Administered 2023-08-17: 150 ug/kg/min via INTRAVENOUS

## 2023-08-17 MED ORDER — SPOT INK MARKER SYRINGE KIT
PACK | SUBMUCOSAL | Status: DC | PRN
  Administered 2023-08-17: 1 mL via SUBMUCOSAL

## 2023-08-17 MED ORDER — GLUCAGON HCL RDNA (DIAGNOSTIC) 1 MG IJ SOLR
INTRAMUSCULAR | Status: DC | PRN
  Administered 2023-08-17: .5 mg via INTRAVENOUS

## 2023-08-17 MED ORDER — GLUCAGON HCL RDNA (DIAGNOSTIC) 1 MG IJ SOLR
INTRAMUSCULAR | Status: AC
  Filled 2023-08-17: qty 1

## 2023-08-17 MED ORDER — LIDOCAINE HCL 1 % IJ SOLN
INTRAMUSCULAR | Status: DC | PRN
  Administered 2023-08-17: 50 mg via INTRADERMAL

## 2023-08-17 MED ORDER — LACTATED RINGERS IV SOLN
INTRAVENOUS | Status: DC | PRN
Start: 2023-08-17 — End: 2023-08-17

## 2023-08-17 NOTE — Discharge Instructions (Addendum)
EGD Discharge instructions Please read the instructions outlined below and refer to this sheet in the next few weeks. These discharge instructions provide you with general information on caring for yourself after you leave the hospital. Your doctor may also give you specific instructions. While your treatment has been planned according to the most current medical practices available, unavoidable complications occasionally occur. If you have any problems or questions after discharge, please call your doctor. ACTIVITY You may resume your regular activity but move at a slower pace for the next 24 hours.  Take frequent rest periods for the next 24 hours.  Walking will help expel (get rid of) the air and reduce the bloated feeling in your abdomen.  No driving for 24 hours (because of the anesthesia (medicine) used during the test).  You may shower.  Do not sign any important legal documents or operate any machinery for 24 hours (because of the anesthesia used during the test).  NUTRITION Drink plenty of fluids.  You may resume your normal diet.  Begin with a light meal and progress to your normal diet.  Avoid alcoholic beverages for 24 hours or as instructed by your caregiver.  MEDICATIONS You may resume your normal medications unless your caregiver tells you otherwise.  WHAT YOU CAN EXPECT TODAY You may experience abdominal discomfort such as a feeling of fullness or "gas" pains.  FOLLOW-UP Your doctor will discuss the results of your test with you.  SEEK IMMEDIATE MEDICAL ATTENTION IF ANY OF THE FOLLOWING OCCUR: Excessive nausea (feeling sick to your stomach) and/or vomiting.  Severe abdominal pain and distention (swelling).  Trouble swallowing.  Temperature over 101 F (37.8 C).  Rectal bleeding or vomiting of blood.    Colonoscopy Discharge Instructions  Read the instructions outlined below and refer to this sheet in the next few weeks. These discharge instructions provide you with  general information on caring for yourself after you leave the hospital. Your doctor may also give you specific instructions. While your treatment has been planned according to the most current medical practices available, unavoidable complications occasionally occur. If you have any problems or questions after discharge, call Dr. Jena Gauss at [phone]. ACTIVITY You may resume your regular activity, but move at a slower pace for the next 24 hours.  Take frequent rest periods for the next 24 hours.  Walking will help get rid of the air and reduce the bloated feeling in your belly (abdomen).  No driving for 24 hours (because of the medicine (anesthesia) used during the test).   Do not sign any important legal documents or operate any machinery for 24 hours (because of the anesthesia used during the test).  NUTRITION Drink plenty of fluids.  You may resume your normal diet as instructed by your doctor.  Begin with a light meal and progress to your normal diet. Heavy or fried foods are harder to digest and may make you feel sick to your stomach (nauseated).  Avoid alcoholic beverages for 24 hours or as instructed.  MEDICATIONS You may resume your normal medications unless your doctor tells you otherwise.  WHAT YOU CAN EXPECT TODAY Some feelings of bloating in the abdomen.  Passage of more gas than usual.  Spotting of blood in your stool or on the toilet paper.  IF YOU HAD POLYPS REMOVED DURING THE COLONOSCOPY: No aspirin products for 7 days or as instructed.  No alcohol for 7 days or as instructed.  Eat a soft diet for the next 24 hours.  FINDING  OUT THE RESULTS OF YOUR TEST Not all test results are available during your visit. If your test results are not back during the visit, make an appointment with your caregiver to find out the results. Do not assume everything is normal if you have not heard from your caregiver or the medical facility. It is important for you to follow up on all of your test  results.  SEEK IMMEDIATE MEDICAL ATTENTION IF: You have more than a spotting of blood in your stool.  Your belly is swollen (abdominal distention).  You are nauseated or vomiting.  You have a temperature over 101.  You have abdominal pain or discomfort that is severe or gets worse throughout the day.       Your EGD was normal.  Your esophagus was stretched  You had a very large polyp in the right side of your colon it took a long time to remove.  At a minimum, you will need a repeat colonoscopy in 3 months to make sure all the polyp was removed.     if pathology is bad, you may need this part of your colon removed by operation  Do not take any form of aspirin even your baby aspirin or Indocin or any other form of NSAID for 2 weeks  to minimize the chances of polypectomy site bleeding; after 2 weeks you may resume   at patient request, I called [name] at [phone] -

## 2023-08-17 NOTE — Op Note (Signed)
[facility] Patient Name: Andrea Herrera Procedure Date: 08/17/2023 10:28 AM MRN: [id number] Date of Birth: 21-Dec-1950 Attending MD: Gennette Pac , MD, [id number] CSN: [id number] Age: 72 Admit Type: Outpatient Procedure:                Colonoscopy Indications:              High risk colon cancer surveillance: Personal                            history of colonic polyps Providers:                Gennette Pac, MD, Edrick Kins, RN,                            Zena Amos Referring MD:              Medicines:                Propofol per Anesthesia Complications:            No immediate complications. Estimated Blood Loss:     Estimated blood loss was minimal. Procedure:                Pre-Anesthesia Assessment:                           - Prior to the procedure, a History and Physical                            was performed, and patient medications and                            allergies were reviewed. The patient's tolerance of                            previous anesthesia was also reviewed. The risks                            and benefits of the procedure and the sedation                            options and risks were discussed with the patient.                            All questions were answered, and informed consent                            was obtained. Prior Anticoagulants: The patient has                            taken no anticoagulant or antiplatelet agents. ASA                            Grade Assessment: III - A patient with severe  systemic disease. After reviewing the risks and                            benefits, the patient was deemed in satisfactory                            condition to undergo the procedure.                           After obtaining informed consent, the colonoscope                            was passed under direct vision. Throughout the                            procedure, the  patient's blood pressure, pulse, and                            oxygen saturations were monitored continuously. The                            [id number]) scope was introduced through the                            anus and advanced to the the cecum, identified by                            appendiceal orifice and ileocecal valve. The                            colonoscopy was performed without difficulty. The                            patient tolerated the procedure well. The quality                            of the bowel preparation was adequate. The                            ileocecal valve, appendiceal orifice, and rectum                            were photographed. Scope In: 10:59:10 AM Scope Out: 12:05:47 PM Scope Withdrawal Time: 0 hours 59 minutes 10 seconds  Total Procedure Duration: 1 hour 6 minutes 37 seconds  Findings:      The perianal and digital rectal examinations were normal.      Scattered medium-mouthed diverticula were found in the sigmoid colon,       descending colon and mid transverse colon. \      In the mid ascending colon there was a spiraling carpet type polyp       measuring approximately 6 cm x 4 cm. Please see photos. Approach was       difficult. This lesion did nicely lifted with Samson Frederic view injected       submucosally. Piecemeal  hot snare polypectomy ensued with large 20 mm       meter snare and the smaller 10 mm snare. This lesion appeared to be       fairly well removed although I could not see the proximal side of the       this polyp to assess extent. I attempted to retroflex in the ascending       segment but was unable to do so with the adult scope. The periphery of       this polypectomy site was ablated with the tip of the hot snare unit.       Multiple specimen submitted to the pathologist. Finally, I tattooed this       lesion 3 cm distal to it. See photos. Polypectomy site felt to be too       broad for clipping. Impression:                - Diverticulosis in the sigmoid colon, in the                            descending colon and in the mid transverse colon.                           -Large complex ascending colon carpet polyp status                            post Samson Frederic view left follow-up piecemeal polypectomy                            and polypectomy site ablation.                           -Status post tattoo Moderate Sedation:      Moderate (conscious) sedation was personally administered by an       anesthesia professional. The following parameters were monitored: oxygen       saturation, heart rate, blood pressure, respiratory rate, EKG, adequacy       of pulmonary ventilation, and response to care. Recommendation:           - Patient has a contact number available for                            emergencies. The signs and symptoms of potential                            delayed complications were discussed with the                            patient. Return to normal activities tomorrow.                            Written discharge instructions were provided to the                            patient.                           - Advance  diet as tolerated. Follow-up on                            pathology. In this particular case I have                            recommended patient not take her Indocin or baby                            aspirin or any other form of NSAIDs/aspirin x 14                            days; then she can resume                           -Reviewed pathology report. If advanced histology,                            recommend right hemicolectomy; if not, 100-month                            follow-up colonoscopy utilizing the pediatric                            colonoscope in the hopes of retroflexion views of                            the polypectomy site Procedure Code(s):        --- Professional ---                           [phone], Colonoscopy, flexible; diagnostic, including                             collection of specimen(s) by brushing or washing,                            when performed (separate procedure) Diagnosis Code(s):        --- Professional ---                           Z86.010, Personal history of colonic polyps                           K57.30, Diverticulosis of large intestine without                            perforation or abscess without bleeding CPT copyright 2022 American Medical Association. All rights reserved. The codes documented in this report are preliminary and upon coder review may  be revised to meet current compliance requirements. Gerrit Friends. Paisli Silfies, MD Gennette Pac, MD 08/17/2023 12:25:11 PM This report has been signed electronically. Number of Addenda: 0

## 2023-08-17 NOTE — Telephone Encounter (Signed)
Pt is requesting a return call from you. Pt states that she had a procedure done this morning and that you did not come out to tell her sister how she was doing or what was going on.

## 2023-08-17 NOTE — H&P (Signed)
@LOGO @   Primary Care Physician:  Lawerance Sabal, [place] Primary Gastroenterologist:  Dr. Jena Gauss  Pre-Procedure History & Physical: HPI:  Andrea Herrera is a 72 y.o. female here for  further evaluation of GERD dyspepsia and esophageal dysphagia via EGD.  History colonic polyps; due for screening colonoscopy today.  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 Received 2 DES to RCA. EF was normal.   Diabetes mellitus without complication (HCC)    GERD (gastroesophageal reflux disease)    HLD (hyperlipidemia)    Hypertension    Myocardial infarction [facility])     Past Surgical History:  Procedure Laterality Date   ABDOMINAL HYSTERECTOMY     CARDIAC CATHETERIZATION N/A 04/26/2015   Procedure: Left Heart Cath and Coronary Angiography;  Surgeon: Runell Gess, MD;  Location: [facility] INVASIVE CV LAB;  Service: Cardiovascular;  Laterality: N/A;   cardiac stents  2016   x 2    Prior to Admission medications   Medication Sig Start Date End Date Taking? Authorizing Provider  acetaminophen (TYLENOL) 650 MG CR tablet Take 650 mg by mouth 2 (two) times daily as needed for pain.   Yes [provider]  amLODipine (NORVASC) 10 MG tablet Take 1 tablet by mouth once daily 07/07/20  Yes Branch, Dorothe Pea, MD  aspirin EC 81 MG tablet Take 1 tablet (81 mg total) by mouth daily. 04/28/15  Yes Marrian Salvage, MD  atorvastatin (LIPITOR) 80 MG tablet TAKE 1 TABLET BY MOUTH ONCE DAILY AT  6  PM 12/21/21  Yes Branch, Dorothe Pea, MD  cholecalciferol (VITAMIN D) 1000 UNITS tablet Take 1,000 Units by mouth in the morning.   Yes [provider]  empagliflozin (JARDIANCE) 25 MG TABS tablet Take 10 mg by mouth daily.   Yes [provider]  gabapentin (NEURONTIN) 100 MG capsule Take 100 mg by mouth daily as needed.   Yes [provider]  glipiZIDE (GLUCOTROL XL) 5 MG 24 hr tablet Take 5 mg  by mouth daily. 06/13/22  Yes [provider]  indomethacin (INDOCIN) 25 MG capsule Take 25 mg by mouth daily as needed (pain.).   Yes [provider]  lisinopril (ZESTRIL) 5 MG tablet Take 1 tablet (5 mg total) by mouth daily. 11/29/22  Yes Sharlene Dory, NP  metFORMIN (GLUCOPHAGE-XR) 500 MG 24 hr tablet Take 1,000 mg by mouth 2 (two) times daily with a meal. 02/10/15  Yes [provider]  metoprolol tartrate (LOPRESSOR) 25 MG tablet Take 1 tablet by mouth twice daily 07/25/23  Yes Branch, Dorothe Pea, MD  Multiple Vitamin (MULTIVITAMIN) tablet Take 1 tablet by mouth in the morning.   Yes [provider]  nitroGLYCERIN (NITROSTAT) 0.4 MG SL tablet Place 1 tablet (0.4 mg total) under the tongue every 5 (five) minutes x 3 doses as needed for chest pain. 12/07/19  Yes BranchDorothe Pea, MD  Omega-3 Fatty Acids (FISH OIL PO) Take 1 capsule by mouth daily as needed. In the morning.   Yes [provider]  pantoprazole (PROTONIX) 40 MG tablet Take 1 tablet by mouth once daily 08/01/23  Yes [name], [name], NP  polyethylene glycol-electrolytes (NULYTELY) 420 g solution Take 4,000 mLs by mouth once. 01/24/23  Yes [provider]  Ferrous Sulfate (IRON PO) Take 1 tablet by mouth daily.    [provider]  POTASSIUM PO  Take 99 mg by mouth every other day. In the morning. Patient not taking: Reported on 08/02/2023    [provider]    Allergies as of 07/06/2023   (No Known Allergies)    Family History  Problem Relation Age of Onset   Colon cancer Mother    Lung cancer Father    Stroke Father    Pancreatic cancer Brother    Heart disease Paternal Grandmother    Stroke Paternal Grandmother    Heart disease Paternal Grandfather    Stroke Paternal Grandfather     Social History   Socioeconomic History   Marital status: Divorced    Spouse name: Not on file   Number of children: Not on file   Years of education: Not on file    Highest education level: Not on file  Occupational History   Not on file  Tobacco Use   Smoking status: Former    Current packs/day: 0.00    Average packs/day: 0.3 packs/day for 27.6 years (6.9 ttl pk-yrs)    Types: Cigarettes    Start date: 09/11/1987    Quit date: 04/21/2015    Years since quitting: 8.3    Passive exposure: Never   Smokeless tobacco: Never  Vaping Use   Vaping status: Never Used  Substance and Sexual Activity   Alcohol use: No    Alcohol/week: 0.0 standard drinks of alcohol   Drug use: No   Sexual activity: Not on file  Other Topics Concern   Not on file  Social History Narrative   Not on file   Social Determinants of Health   Financial Resource Strain: Not on file  Food Insecurity: Not on file  Transportation Needs: Not on file  Physical Activity: Not on file  Stress: Not on file  Social Connections: Not on file  Intimate Partner Violence: Not on file    Review of Systems: See HPI, otherwise negative ROS  Physical Exam: BP (!) 149/66   Pulse 73   Temp 98.6 F (37 C) (Oral)   Resp 16   Ht 5' (1.524 m)   Wt 59.9 kg   SpO2 100%   BMI 25.78 kg/m  General:   Alert,  Well-developed, well-nourished, pleasant and cooperative in NAD Neck:  Supple; no masses or thyromegaly. No significant cervical adenopathy. Lungs:  Clear throughout to auscultation.   No wheezes, crackles, or rhonchi. No acute distress. Heart:  Regular rate and rhythm; no murmurs, clicks, rubs,  or gallops. Abdomen: Non-distended, normal bowel sounds.  Soft and nontender without appreciable mass or hepatosplenomegaly.   Impression/Plan:    72 year old lady with GERD/dyspepsia/esophageal dysphagia.  History of colonic adenoma.  Patient is here for an EGD with esophageal dilation is feasible/appropriate  as well as a surveillance colonoscopy per plan.The risks, benefits, limitations, imponderables and alternatives regarding both EGD and colonoscopy have been reviewed with the patient.  Questions have been answered. All parties agreeable.       Notice: This dictation was prepared with Dragon dictation along with smaller phrase technology. Any transcriptional errors that result from this process are unintentional and may not be corrected upon review.

## 2023-08-17 NOTE — Op Note (Signed)
[facility] Patient Name: Andrea Herrera Procedure Date: 08/17/2023 10:27 AM MRN: [id number] Date of Birth: Oct 15, 1951 Attending MD: Gennette Pac , MD, [id number] CSN: [id number] Age: 72 Admit Type: Outpatient Procedure:                Upper GI endoscopy Indications:              Dysphagia Providers:                Gennette Pac, MD, Edrick Kins, RN,                            Zena Amos Referring MD:              Medicines:                Propofol per Anesthesia Complications:            No immediate complications. Estimated Blood Loss:     Estimated blood loss: none. Procedure:                Pre-Anesthesia Assessment:                           - Prior to the procedure, a History and Physical                            was performed, and patient medications and                            allergies were reviewed. The patient's tolerance of                            previous anesthesia was also reviewed. The risks                            and benefits of the procedure and the sedation                            options and risks were discussed with the patient.                            All questions were answered, and informed consent                            was obtained. Prior Anticoagulants: The patient has                            taken no anticoagulant or antiplatelet agents. ASA                            Grade Assessment: III - A patient with severe                            systemic disease. After reviewing the risks and  benefits, the patient was deemed in satisfactory                            condition to undergo the procedure.                           After obtaining informed consent, the endoscope was                            passed under direct vision. Throughout the                            procedure, the patient's blood pressure, pulse, and                            oxygen saturations were monitored  continuously. The                            GIF-H190 ([id number]) scope was introduced through the                            mouth, and advanced to the second part of duodenum.                            The upper GI endoscopy was accomplished without                            difficulty. The patient tolerated the procedure                            well. Scope In: 10:48:44 AM Scope Out: 10:53:49 AM Total Procedure Duration: 0 hours 5 minutes 5 seconds  Findings:      The examined esophagus was normal.      The entire examined stomach was normal.      The duodenal bulb and second portion of the duodenum were normal. The       scope was withdrawn. Dilation was performed with a Maloney dilator with       mild resistance at 54 Fr. The dilation site was examined following       endoscope reinsertion and showed no change. Estimated blood loss: none. Impression:               - Normal esophagus. Dilated.                           - Normal stomach.                           - Normal duodenal bulb and second portion of the                            duodenum.                           - No specimens collected. Moderate Sedation:      Moderate (conscious) sedation was personally administered by  an       anesthesia professional. The following parameters were monitored: oxygen       saturation, heart rate, blood pressure, respiratory rate, EKG, adequacy       of pulmonary ventilation, and response to care. Recommendation:           - Patient has a contact number available for                            emergencies. The signs and symptoms of potential                            delayed complications were discussed with the                            patient. Return to normal activities tomorrow.                            Written discharge instructions were provided to the                            patient.                           - Advance diet as tolerated.                           -  Continue present medications.                           - Return to my office in 6 months. See colonoscopy                            report. Procedure Code(s):        --- Professional ---                           [phone], Esophagogastroduodenoscopy, flexible,                            transoral; diagnostic, including collection of                            specimen(s) by brushing or washing, when performed                            (separate procedure)                           43450, Dilation of esophagus, by unguided sound or                            bougie, single or multiple passes Diagnosis Code(s):        --- Professional ---                           R13.10, Dysphagia, unspecified CPT copyright 2022 American Medical Association. All  rights reserved. The codes documented in this report are preliminary and upon coder review may  be revised to meet current compliance requirements. Gerrit Friends. Amontae Ng, MD Gennette Pac, MD 08/17/2023 10:57:39 AM This report has been signed electronically. Number of Addenda: 0

## 2023-08-17 NOTE — Anesthesia Preprocedure Evaluation (Signed)
Anesthesia Evaluation  Patient identified by MRN, date of birth, ID band Patient awake    Reviewed: Allergy & Precautions, H&P , NPO status , Patient's Chart, lab work & pertinent test results, reviewed documented beta blocker date and time   Airway Mallampati: II  TM Distance: >3 FB Neck ROM: full    Dental no notable dental hx.    Pulmonary neg pulmonary ROS, former smoker   Pulmonary exam normal breath sounds clear to auscultation       Cardiovascular Exercise Tolerance: Good hypertension, + CAD and + Past MI  negative cardio ROS  Rhythm:regular Rate:Normal     Neuro/Psych negative neurological ROS  negative psych ROS   GI/Hepatic negative GI ROS, Neg liver ROS,GERD  ,,  Endo/Other  negative endocrine ROSdiabetes    Renal/GU negative Renal ROS  negative genitourinary   Musculoskeletal   Abdominal   Peds  Hematology negative hematology ROS (+) Blood dyscrasia, anemia   Anesthesia Other Findings   Reproductive/Obstetrics negative OB ROS                             Anesthesia Physical Anesthesia Plan  ASA: 3  Anesthesia Plan: General   Post-op Pain Management:    Induction:   PONV Risk Score and Plan: Propofol infusion  Airway Management Planned:   Additional Equipment:   Intra-op Plan:   Post-operative Plan:   Informed Consent: I have reviewed the patients History and Physical, chart, labs and discussed the procedure including the risks, benefits and alternatives for the proposed anesthesia with the patient or authorized representative who has indicated his/her understanding and acceptance.     Dental Advisory Given  Plan Discussed with: CRNA  Anesthesia Plan Comments:        Anesthesia Quick Evaluation

## 2023-08-17 NOTE — Anesthesia Postprocedure Evaluation (Signed)
Anesthesia Post Note  Patient: Andrea Herrera  Procedure(s) Performed: COLONOSCOPY WITH PROPOFOL ESOPHAGOGASTRODUODENOSCOPY (EGD) WITH PROPOFOL MALONEY DILATION POLYPECTOMY SCLEROTHERAPY SUBMUCOSAL TATTOO INJECTION  Patient location during evaluation: Short Stay Anesthesia Type: General Level of consciousness: awake and alert Pain management: pain level controlled Vital Signs Assessment: post-procedure vital signs reviewed and stable Respiratory status: spontaneous breathing Cardiovascular status: blood pressure returned to baseline and stable Postop Assessment: no apparent nausea or vomiting Anesthetic complications: no   No notable events documented.   Last Vitals:  Vitals:   08/17/23 0913  BP: (!) 149/66  Pulse: 73  Resp: 16  Temp: 37 C  SpO2: 100%    Last Pain:  Vitals:   08/17/23 1043  TempSrc:   PainSc: 4                  [place]

## 2023-08-17 NOTE — Transfer of Care (Signed)
Immediate Anesthesia Transfer of Care Note  Patient: Andrea Herrera  Procedure(s) Performed: COLONOSCOPY WITH PROPOFOL ESOPHAGOGASTRODUODENOSCOPY (EGD) WITH PROPOFOL MALONEY DILATION POLYPECTOMY SCLEROTHERAPY SUBMUCOSAL TATTOO INJECTION  Patient Location: Short Stay  Anesthesia Type:General  Level of Consciousness: awake  Airway & Oxygen Therapy: Patient Spontanous Breathing  Post-op Assessment: Report given to RN  Post vital signs: Reviewed and stable  Last Vitals:  Vitals Value Taken Time  BP    Temp    Pulse    Resp    SpO2      Last Pain:  Vitals:   08/17/23 1043  TempSrc:   PainSc: 4       Patients Stated Pain Goal: 4 (08/17/23 0913)  Complications: No notable events documented.

## 2023-08-18 ENCOUNTER — Encounter: Payer: Self-pay | Admitting: Internal Medicine

## 2023-08-18 LAB — SURGICAL PATHOLOGY

## 2023-08-23 ENCOUNTER — Encounter (HOSPITAL_COMMUNITY): Payer: Self-pay | Admitting: Internal Medicine

## 2023-08-30 DIAGNOSIS — H2513 Age-related nuclear cataract, bilateral: Secondary | ICD-10-CM | POA: Diagnosis not present

## 2023-08-30 DIAGNOSIS — H52223 Regular astigmatism, bilateral: Secondary | ICD-10-CM | POA: Diagnosis not present

## 2023-08-30 DIAGNOSIS — E119 Type 2 diabetes mellitus without complications: Secondary | ICD-10-CM | POA: Diagnosis not present

## 2023-08-30 DIAGNOSIS — Z7984 Long term (current) use of oral hypoglycemic drugs: Secondary | ICD-10-CM | POA: Diagnosis not present

## 2023-08-30 DIAGNOSIS — H524 Presbyopia: Secondary | ICD-10-CM | POA: Diagnosis not present

## 2023-09-03 ENCOUNTER — Other Ambulatory Visit: Payer: Self-pay | Admitting: Nurse Practitioner

## 2023-09-08 ENCOUNTER — Telehealth: Payer: Self-pay | Admitting: Internal Medicine

## 2023-09-08 NOTE — Telephone Encounter (Signed)
RMR wants patient to repeat colonoscopy in 3 months. Will she need an OV prior?

## 2023-09-08 NOTE — Telephone Encounter (Signed)
yes

## 2023-09-08 NOTE — Telephone Encounter (Signed)
LMOM for her to return my call to schedule an OV

## 2023-09-10 ENCOUNTER — Other Ambulatory Visit: Payer: Self-pay | Admitting: Gastroenterology

## 2023-10-10 ENCOUNTER — Other Ambulatory Visit: Payer: Self-pay | Admitting: Gastroenterology

## 2023-10-14 ENCOUNTER — Encounter: Payer: Self-pay | Admitting: Cardiology

## 2023-10-24 ENCOUNTER — Other Ambulatory Visit (HOSPITAL_COMMUNITY): Payer: Self-pay | Admitting: Family Medicine

## 2023-10-24 DIAGNOSIS — Z1231 Encounter for screening mammogram for malignant neoplasm of breast: Secondary | ICD-10-CM

## 2023-10-26 NOTE — Progress Notes (Signed)
 GI Office Note    Referring Provider: Job Bolt, [place] Primary Care Physician:  Job Bolt, [place] Primary Gastroenterologist: Lamar HERO.Rourk, MD  Date:  10/27/2023  ID:  Andrea Herrera, DOB 1951/04/21, MRN [id number]   Chief Complaint   Chief Complaint  Patient presents with   Follow-up    Doing well no issues   History of Present Illness  Andrea Herrera is a 73 y.o. female with a history of CAD, MI in 2005 with stent placement, HTN, HLD, GERD, diabetes, and chronic constipation presenting today for follow-up of chronic GI concerns post procedures with need to repeat colonoscopy.  Colonoscopy in December 2019 normal.  Advised repeat in 5 years.   Initial OV 11/23/2022.  Having difficulty with swallowing as well as a burning sensation in her stomach.  Using Mylanta as needed.  Usually occurs with sausage, spaghetti sauce, pizza, and some other meats.  Also noted again for collicular and having nasty burps.  Hydron with constipation, using laxative every 2 weeks or once per month due to hard stools.  Usually has a bowel movement every 3 days and does have to strain.  Tried senna which was not effective.  Had not tried any MiraLAX. Advised Colace daily, MiraLAX daily.  Start pantoprazole  40 mg once daily.  Scheduled for EGD with dilation and colonoscopy.  Check iron and CBC.   Labs 11/24/2022: Hemoglobin 11.7, platelets 281, normal iron panel.   Patient scheduled for EGD/ED/TCS for 03/04/2023.  Procedures were canceled given her blood sugar was elevated at 398.  She was advised that she will need to maintain blood sugar less than 250 consistently prior to scheduling.   OV 02/21/2023.  Constipation was doing very well with Colace daily or every other day as well as MiraLAX nightly.  Was continuing to have some issues with swallowing feel like food stuck in her lower chest.  Reflux had improved with pantoprazole  40 mg once daily.  Continuing to experience fatigue despite normalized labs in  February.  Last office visit 06/27/23.  Having issues with her hemorrhoids as they were coming out at times and also noted foul-smelling gas.  Having produced with her constipation.  Taking MiraLAX every other day usually.  Denied any abdominal pain but did report urgency as well as some incomplete emptying and smearing.  Usually will go easily when she takes Colace and MiraLAX.  Feelings of dysphagia happening also with liquids and solids.  Having some occasional foul-smelling burps but taking pantoprazole .  Reported better control her blood sugars, consistently less than 200. Scheduled for EGD/Colonoscopy.  Advised to continue pantoprazole  daily.  Continue MiraLAX and increase to daily in her coffee.  Continue Colace and start Phazyme/Gas-X as needed.  Continue over-the-counter Preparation H or hydrocortisone for hemorrhoids.  Provided hemorrhoid banding pamphlet.  EGD October 2024: -Normal esophagus s/p dilation -Normal stomach -Normal duodenum  Colonoscopy October 2024: -Normal perianal and DRE. -Scattered medium mouth diverticula found in the sigmoid, descending, mid transverse colon -Spiraling carpet type polyp measuring 6x4 centimeters in mid ascending colon s/p piecemeal hot snare polypectomy -Periphery of polypectomy site ablated -Multiple specimens sent to allergy, tattooed lesion 3 cm distal to it.  Site felt to be too broad for clipping -Advised patient not to take Indocin  or baby aspirin  or any other form of NSAIDs for 14 days -If advanced histology, recommended right hemicolectomy.  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 She is taking both of these every other day. Still has to wear a pad because she can have some leakage/smearing that can occur. She has  been eating more beans recently and soups/She has not been taking any extra fiber. Does feel tissue moving but intermittent smelly gas. She inquired about a probiotic.   Has worsening reflux with carbonation can cause her to burp.   Blood sugars have been good. Has had allergies and tacking allergy medication.   Has been having some lower abdominal pain as well intermittently. Has incomplete emptying.   Started back her iron last week and takes it every other day. Has had fatigue as well.   Wt Readings from Last 3 Encounters:  10/27/23 132 lb (59.9 kg)  08/17/23 132 lb (59.9 kg)  08/02/23 132 lb (59.9 kg)    Current Outpatient Medications  Medication Sig Dispense Refill   acetaminophen  (TYLENOL ) 650 MG CR tablet Take 650 mg by mouth 2 (two) times daily as needed for pain.     amLODipine  (NORVASC ) 10 MG tablet Take 1 tablet by mouth once daily 90 tablet 1   atorvastatin  (LIPITOR ) 80 MG tablet TAKE 1 TABLET BY MOUTH ONCE DAILY AT  6  PM 90 tablet 3   cholecalciferol (VITAMIN D) 1000 UNITS tablet Take 1,000 Units by mouth in the morning.     empagliflozin (JARDIANCE) 25 MG TABS tablet Take 10 mg by mouth daily.     Ferrous Sulfate (IRON PO) Take 1 tablet by mouth daily.     gabapentin  (NEURONTIN ) 100 MG capsule Take 100 mg by mouth daily as needed.     glipiZIDE  (GLUCOTROL  XL) 5 MG 24 hr tablet Take 5 mg by mouth daily.     lisinopril  (ZESTRIL ) 5 MG tablet Take 1 tablet by mouth once daily 30 tablet 4   Magnesium 250 MG TABS Take 250 mg by mouth daily.     metFORMIN  (GLUCOPHAGE -XR) 500 MG 24 hr tablet Take 1,000 mg by mouth 2 (two) times daily with a meal.     metoprolol  tartrate (LOPRESSOR ) 25 MG tablet Take 1 tablet by mouth twice daily 180 tablet 1   Multiple Vitamin (MULTIVITAMIN) tablet Take 1 tablet by mouth in the morning.     nitroGLYCERIN  (NITROSTAT ) 0.4 MG SL tablet Place 1 tablet (0.4 mg total) under the tongue every 5 (five) minutes x 3 doses as needed for chest pain. 25  tablet 3   pantoprazole  (PROTONIX ) 40 MG tablet Take 1 tablet by mouth once daily 30 tablet 5   POTASSIUM PO Take 99 mg by mouth every other day. In the morning. (Patient not taking: Reported on 08/02/2023)     No current facility-administered medications for this visit.    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 Received 2 DES to RCA. EF was normal.   Diabetes mellitus without complication (HCC)    GERD (gastroesophageal reflux disease)    HLD (hyperlipidemia)    Hypertension    Myocardial infarction [facility])     Past Surgical History:  Procedure Laterality Date   ABDOMINAL HYSTERECTOMY     CARDIAC CATHETERIZATION N/A 04/26/2015   Procedure: Left Heart Cath and Coronary Angiography;  Surgeon: Dorn JINNY Lesches, MD;  Location: [facility] INVASIVE CV LAB;  Service: Cardiovascular;  Laterality: N/A;   cardiac stents  2016   x 2   COLONOSCOPY WITH PROPOFOL  N/A 08/17/2023   Procedure: COLONOSCOPY WITH PROPOFOL ;  Surgeon: Shaaron Lamar HERO, MD;  Location: [facility];  Service: Endoscopy;  Laterality: N/A;  11:00am, asa 3   ESOPHAGOGASTRODUODENOSCOPY (EGD) WITH PROPOFOL  N/A 08/17/2023   Procedure: ESOPHAGOGASTRODUODENOSCOPY (EGD) WITH PROPOFOL ;  Surgeon: Shaaron Lamar HERO, MD;  Location: [facility];  Service: Endoscopy;  Laterality: N/A;   MALONEY DILATION N/A 08/17/2023   Procedure: AGAPITO DILATION;  Surgeon: Shaaron Lamar HERO, MD;  Location: [facility];  Service: Endoscopy;  Laterality: N/A;   POLYPECTOMY  08/17/2023   Procedure: POLYPECTOMY;  Surgeon: Shaaron Lamar HERO, MD;  Location: [facility];  Service: Endoscopy;;   SCLEROTHERAPY  08/17/2023   Procedure: MATIAS;  Surgeon: Shaaron Lamar HERO, MD;  Location: [facility];  Service: Endoscopy;;   SUBMUCOSAL TATTOO INJECTION  08/17/2023   Procedure: SUBMUCOSAL TATTOO INJECTION;  Surgeon: Shaaron Lamar HERO, MD;  Location: [facility];   Service: Endoscopy;;    Family History  Problem Relation Age of Onset   Colon cancer Mother    Lung cancer Father    Stroke Father    Pancreatic cancer Brother    Heart disease Paternal Grandmother    Stroke Paternal Grandmother    Heart disease Paternal Grandfather    Stroke Paternal Grandfather     Allergies as of 10/27/2023   (No Known Allergies)    Social History   Socioeconomic History   Marital status: Divorced    Spouse name: Not on file   Number of children: Not on file   Years of education: Not on file   Highest education level: Not on file  Occupational History   Not on file  Tobacco Use   Smoking status: Former    Current packs/day: 0.00    Average packs/day: 0.3 packs/day for 27.6 years (6.9 ttl pk-yrs)    Types: Cigarettes    Start date: 09/11/1987    Quit date: 04/21/2015    Years since quitting: 8.5    Passive exposure: Never   Smokeless tobacco: Never  Vaping Use   Vaping status: Never Used  Substance and Sexual Activity   Alcohol use: No    Alcohol/week: 0.0 standard drinks of alcohol   Drug use: No   Sexual activity: Not on file  Other Topics Concern   Not on file  Social History Narrative   Not on file   Social Drivers of Health   Financial Resource Strain: Not on file  Food Insecurity: Not on file  Transportation Needs: Not on file  Physical Activity: Not on file  Stress: Not on file  Social Connections: Not on file     Review of Systems   Gen: Denies fever, chills, anorexia. Denies fatigue, weakness, weight loss.  CV: Denies chest pain, palpitations, syncope, peripheral edema, and claudication. Resp: Denies dyspnea at rest, cough, wheezing, coughing up blood, and pleurisy. GI: See HPI Derm: Denies rash, itching, dry skin Psych: Denies depression, anxiety, memory loss, confusion. No homicidal or suicidal ideation.  Heme: Denies bruising, bleeding, and enlarged lymph nodes.  Physical Exam   BP (!) 151/71 (BP Location: Right  Arm, Patient Position: Sitting, Cuff Size: Normal)   Pulse 98   Temp 98.6 F (37 C) (Oral)   Ht 5' (1.524 m)   Wt 132 lb (59.9 kg)   SpO2 98%   BMI 25.78 kg/m   General:  Alert and oriented. No distress noted. Pleasant and cooperative.  Head:  Normocephalic and atraumatic. Eyes:  Conjuctiva clear without scleral icterus. Mouth:  Oral mucosa pink and moist. Good dentition. No lesions. Lungs:  Clear to auscultation bilaterally. No wheezes, rales, or rhonchi. No distress.  Heart:  S1, S2 present without murmurs appreciated.  Abdomen:  +BS, soft, non-tender and non-distended. No rebound or guarding. No HSM or masses noted. Rectal: deferred Msk:  Symmetrical without gross deformities. Normal posture. Extremities:  Without edema. Neurologic:  Alert and  oriented x4 Psych:  Alert and cooperative. Normal mood and affect.  Assessment  Andrea Herrera is a 73 y.o. female with a history of CAD, MI in 2005 with stent placement, HTN, HLD, GERD, diabetes, and chronic constipation presenting today for follow-up of chronic GI concerns post procedures with need to repeat colonoscopy.  History of advanced adenomatous colon polyps: History of polyps in December 2019 on colonoscopy.  Most recent colonoscopy in October with 6 x 4 centimeter spiraling polyp in the mid ascending colon s/p piecemeal polypectomy with ablation and tattooing.  Pathology revealed tubovillous adenoma.  Recommended 61-month surveillance to ensure good margins of polypectomy site.  Recommended with pediatric colonoscope given difficulty with retroflexion on examination.  Patient has agreed to proceed with repeat colonoscopy.  Constipation, hemorrhoids: Continues to have some intermittent issues with constipation, mostly incomplete emptying and some mild straining.  Doing fairly well with MiraLAX and Colace every other day.  I suspect that her hemorrhoids as well as her incomplete emptying are the etiology of her fecal smearing.  Given  this we will start a daily fiber supplementation to try to help bulk her stools.  Will continue current bowel regimen as is.  Advised Preparation H as needed for hemorrhoids.  We discussed hemorrhoid banding again today and advised that this may be a viable option for her.  GERD, epigastric pain, dysphagia: GERD fairly well-controlled with pantoprazole  40 mg once daily.  Continues to have some triggers with carbonated beverages.  Dysphagia improved.  No significant epigastric pain.  PLAN   Proceed with colonoscopy with pediatric colonoscope with propofol  by Dr. Shaaron in near future: the risks, benefits, and alternatives have been discussed with the patient in detail. The patient states understanding and desires to proceed. ASA 3 Hold jardiance for 3 days Hold glipizide  and metformin  night prior to and morning of procedure.  Hold iron for 7 days Continue pantoprazole  40 mg once daily Continue Colace and MiraLAX daily Preparation H or Anusol as needed for hemorrhoids. May be a good candidate for hemorrhoid banding, can discuss further after her colonoscopy. Probiotic daily and fiber supplement daily.  GERD diet, high-fiber diet recommended Follow up in 4 months    Charmaine Melia, MSN, FNP-BC, AGACNP-BC [facility]

## 2023-10-27 ENCOUNTER — Encounter: Payer: Self-pay | Admitting: *Deleted

## 2023-10-27 ENCOUNTER — Encounter: Payer: Self-pay | Admitting: Gastroenterology

## 2023-10-27 ENCOUNTER — Other Ambulatory Visit: Payer: Self-pay | Admitting: *Deleted

## 2023-10-27 ENCOUNTER — Telehealth: Payer: Self-pay | Admitting: *Deleted

## 2023-10-27 ENCOUNTER — Ambulatory Visit: Payer: Medicare HMO | Admitting: Gastroenterology

## 2023-10-27 VITALS — BP 151/71 | HR 98 | Temp 98.6°F | Ht 60.0 in | Wt 132.0 lb

## 2023-10-27 DIAGNOSIS — K649 Unspecified hemorrhoids: Secondary | ICD-10-CM

## 2023-10-27 DIAGNOSIS — Z860101 Personal history of adenomatous and serrated colon polyps: Secondary | ICD-10-CM | POA: Diagnosis not present

## 2023-10-27 DIAGNOSIS — Z860100 Personal history of colon polyps, unspecified: Secondary | ICD-10-CM

## 2023-10-27 DIAGNOSIS — K219 Gastro-esophageal reflux disease without esophagitis: Secondary | ICD-10-CM

## 2023-10-27 DIAGNOSIS — K5900 Constipation, unspecified: Secondary | ICD-10-CM

## 2023-10-27 DIAGNOSIS — R1310 Dysphagia, unspecified: Secondary | ICD-10-CM

## 2023-10-27 DIAGNOSIS — R5383 Other fatigue: Secondary | ICD-10-CM

## 2023-10-27 DIAGNOSIS — R140 Abdominal distension (gaseous): Secondary | ICD-10-CM

## 2023-10-27 DIAGNOSIS — R151 Fecal smearing: Secondary | ICD-10-CM | POA: Diagnosis not present

## 2023-10-27 MED ORDER — PEG 3350-KCL-NA BICARB-NACL 420 G PO SOLR: 4000.0000 mL | Freq: Once | ORAL | 0 refills | Status: AC

## 2023-10-27 NOTE — Telephone Encounter (Signed)
 [employer] PA: Approved Authorization #[id number]  Tracking #[id number]:   Dates of service 12/09/2023 - 03/02/2024

## 2023-10-27 NOTE — Patient Instructions (Addendum)
 For GERD: Continue pantoprazole  40 mg once daily. Follow a GERD diet:  Avoid fried, fatty, greasy, spicy, citrus foods. Avoid caffeine and carbonated beverages. Avoid chocolate. Try eating 4-6 small meals a day rather than 3 large meals. Do not eat within 3 hours of laying down. Prop head of bed up on wood or bricks to create a 6 inch incline.   Given your recent large polyp on your colonoscopy we will plan to repeat again in early February as this will be the 39-month timeframe recommended by Dr. Shaaron previously.  You will receive detailed written instructions just as you did previously.  For constipation/gassiness: Continue MiraLAX and Colace every other day. Start a fiber supplement such as Benefiber or Metamucil.  You may get the powder form or capsules or Gummies, what ever you prefer.  I feel as though the powder form or capsule form work best.  Start with 1 tablespoon or a standard dose once daily for about 3 weeks and then you can increase to twice daily if needed. Ensure you are getting an adequate amount of water  every day. You may get a probiotic over-the-counter.  Good brands are Florastor, restorative, [place] colon health, align, or even Culturelle.  For hemorrhoids you can continue to use Preparation H over-the-counter as needed.  I suspect her hemorrhoids could be contributing to the fecal smearing that you have been having.  Please let me know if you would like to consider hemorrhoid banding.  We can always try 1 session and if you feel like it was not helpful then we do not have to continue.  This is a process for repeat and 3 different hemorrhoid columns 2 weeks apart from each other.  We will plan to follow-up in about 4 months, sooner if needed  It was a pleasure to see you today. I want to create trusting relationships with patients. If you receive a survey regarding your visit,  I greatly appreciate you taking time to fill this out on paper or through your MyChart. I  value your feedback.  Charmaine Melia, MSN, FNP-BC, AGACNP-BC [facility]

## 2023-11-21 ENCOUNTER — Ambulatory Visit (HOSPITAL_COMMUNITY)
Admission: RE | Admit: 2023-11-21 | Discharge: 2023-11-21 | Disposition: A | Discharge status: Home / Self Care | Payer: Medicare HMO | Source: Ambulatory Visit | Attending: Family Medicine | Admitting: Family Medicine

## 2023-11-21 ENCOUNTER — Encounter (HOSPITAL_COMMUNITY): Payer: Self-pay

## 2023-11-21 DIAGNOSIS — Z1231 Encounter for screening mammogram for malignant neoplasm of breast: Secondary | ICD-10-CM | POA: Insufficient documentation

## 2023-12-01 ENCOUNTER — Ambulatory Visit: Payer: Medicare HMO | Admitting: Nutrition

## 2023-12-05 NOTE — Patient Instructions (Signed)
Andrea Herrera  12/05/2023     @PREFPERIOPPHARMACY @   Your procedure is scheduled on  12/09/2023.   Report to [facility] at  0900 A.M.   Call this number if you have problems the morning of surgery:  [phone]  If you experience any cold or flu symptoms such as cough, fever, chills, shortness of breath, etc. between now and your scheduled surgery, please notify us at the above number.   Remember:  Follow the diet and prep instructions given to you by the office.   You may drink clear liquids until 0700 am on 12/09/2023.    Clear liquids allowed are:                    Water, Juice (No red color; non-citric and without pulp; diabetics please choose diet or no sugar options), Carbonated beverages (diabetics please choose diet or no sugar options), Clear Tea (No creamer, milk, or cream, including half & half and powdered creamer), Black Coffee Only (No creamer, milk or cream, including half & half and powdered creamer), and Clear Sports drink (No red color; diabetics please choose diet or no sugar options)    Take these medicines the morning of surgery with A SIP OF WATER       amlodipine, gabapentin, metoprolol, pantoprazole.    Do not wear jewelry, make-up or nail polish, including gel polish,  artificial nails, or any other type of covering on natural nails (fingers and  toes).  Do not wear lotions, powders, or perfumes, or deodorant.  Do not shave 48 hours prior to surgery.  Men may shave face and neck.  Do not bring valuables to the hospital.  [facility] is not responsible for any belongings or valuables.  Contacts, dentures or bridgework may not be worn into surgery.  Leave your suitcase in the car.  After surgery it may be brought to your room.  For patients admitted to the hospital, discharge time will be determined by your treatment team.  Patients discharged the day of surgery will not be allowed to drive home and must have someone with them for 24 hours.     Special instructions:   DO NOT smoke tobacco or vape for 24 hours before your procedure.  Please read over the following fact sheets that you were given. Anesthesia Post-op Instructions and Care and Recovery After Surgery      Colonoscopy, Adult, Care After The following information offers guidance on how to care for yourself after your procedure. Your health care provider may also give you more specific instructions. If you have problems or questions, contact your health care provider. What can I expect after the procedure? After the procedure, it is common to have: A small amount of blood in your stool for 24 hours after the procedure. Some gas. Mild cramping or bloating of your abdomen. Follow these instructions at home: Eating and drinking  Drink enough fluid to keep your urine pale yellow. Follow instructions from your health care provider about eating or drinking restrictions. Resume your normal diet as told by your health care provider. Avoid heavy or fried foods that are hard to digest. Activity Rest as told by your health care provider. Avoid sitting for a long time without moving. Get up to take short walks every 1-2 hours. This is important to improve blood flow and breathing. Ask for help if you feel weak or unsteady. Return to your normal activities as told by  your health care provider. Ask your health care provider what activities are safe for you. Managing cramping and bloating  Try walking around when you have cramps or feel bloated. If directed, apply heat to your abdomen as told by your health care provider. Use the heat source that your health care provider recommends, such as a moist heat pack or a heating pad. Place a towel between your skin and the heat source. Leave the heat on for 20-30 minutes. Remove the heat if your skin turns bright red. This is especially important if you are unable to feel pain, heat, or cold. You have a greater risk of getting  burned. General instructions If you were given a sedative during the procedure, it can affect you for several hours. Do not drive or operate machinery until your health care provider says that it is safe. For the first 24 hours after the procedure: Do not sign important documents. Do not drink alcohol. Do your regular daily activities at a slower pace than normal. Eat soft foods that are easy to digest. Take over-the-counter and prescription medicines only as told by your health care provider. Keep all follow-up visits. This is important. Contact a health care provider if: You have blood in your stool 2-3 days after the procedure. Get help right away if: You have more than a small spotting of blood in your stool. You have large blood clots in your stool. You have swelling of your abdomen. You have nausea or vomiting. You have a fever. You have increasing pain in your abdomen that is not relieved with medicine. These symptoms may be an emergency. Get help right away. Call 911. Do not wait to see if the symptoms will go away. Do not drive yourself to the hospital. Summary After the procedure, it is common to have a small amount of blood in your stool. You may also have mild cramping and bloating of your abdomen. If you were given a sedative during the procedure, it can affect you for several hours. Do not drive or operate machinery until your health care provider says that it is safe. Get help right away if you have a lot of blood in your stool, nausea or vomiting, a fever, or increased pain in your abdomen. This information is not intended to replace advice given to you by your health care provider. Make sure you discuss any questions you have with your health care provider. Document Revised: 11/16/2022 Document Reviewed: 05/27/2021 Elsevier Patient Education  2024 [employer]Monitored Anesthesia Care, Care After The following information offers guidance on how to care for yourself  after your procedure. Your health care provider may also give you more specific instructions. If you have problems or questions, contact your health care provider. What can I expect after the procedure? After the procedure, it is common to have: Tiredness. Little or no memory about what happened during or after the procedure. Impaired judgment when it comes to making decisions. Nausea or vomiting. Some trouble with balance. Follow these instructions at home: For the time period you were told by your health care provider:  Rest. Do not participate in activities where you could fall or become injured. Do not drive or use machinery. Do not drink alcohol. Do not take sleeping pills or medicines that cause drowsiness. Do not make important decisions or sign legal documents. Do not take care of children on your own. Medicines Take over-the-counter and prescription medicines only as told by your health care provider. If you were  prescribed antibiotics, take them as told by your health care provider. Do not stop using the antibiotic even if you start to feel better. Eating and drinking Follow instructions from your health care provider about what you may eat and drink. Drink enough fluid to keep your urine pale yellow. If you vomit: Drink clear fluids slowly and in small amounts as you are able. Clear fluids include water, ice chips, low-calorie sports drinks, and fruit juice that has water added to it (diluted fruit juice). Eat light and bland foods in small amounts as you are able. These foods include bananas, applesauce, rice, lean meats, toast, and crackers. General instructions  Have a responsible adult stay with you for the time you are told. It is important to have someone help care for you until you are awake and alert. If you have sleep apnea, surgery and some medicines can increase your risk for breathing problems. Follow instructions from your health care provider about wearing your  sleep device: When you are sleeping. This includes during daytime naps. While taking prescription pain medicines, sleeping medicines, or medicines that make you drowsy. Do not use any products that contain nicotine or tobacco. These products include cigarettes, chewing tobacco, and vaping devices, such as e-cigarettes. If you need help quitting, ask your health care provider. Contact a health care provider if: You feel nauseous or vomit every time you eat or drink. You feel light-headed. You are still sleepy or having trouble with balance after 24 hours. You get a rash. You have a fever. You have redness or swelling around the IV site. Get help right away if: You have trouble breathing. You have new confusion after you get home. These symptoms may be an emergency. Get help right away. Call 911. Do not wait to see if the symptoms will go away. Do not drive yourself to the hospital. This information is not intended to replace advice given to you by your health care provider. Make sure you discuss any questions you have with your health care provider. Document Revised: 03/01/2022 Document Reviewed: 03/01/2022 Elsevier Patient Education  2024 [employer].

## 2023-12-06 ENCOUNTER — Encounter (HOSPITAL_COMMUNITY): Payer: Self-pay

## 2023-12-06 ENCOUNTER — Encounter (HOSPITAL_COMMUNITY)
Admission: RE | Admit: 2023-12-06 | Discharge: 2023-12-06 | Disposition: A | Discharge status: Home / Self Care | Payer: Medicare HMO | Source: Ambulatory Visit | Attending: Internal Medicine | Admitting: Internal Medicine

## 2023-12-06 DIAGNOSIS — E119 Type 2 diabetes mellitus without complications: Secondary | ICD-10-CM

## 2023-12-06 DIAGNOSIS — D649 Anemia, unspecified: Secondary | ICD-10-CM

## 2023-12-08 NOTE — Progress Notes (Signed)
Called patient to verify that she received instructions and had started her prep.  Patient informed me that Eber Jones and "another person" gave her all her instructions.  She was told that she did not have to come in for an in person interview, however, she does require a CBC and a BMET to be drawn.  Will note this on her chart for the pre-op nurse.

## 2023-12-09 ENCOUNTER — Encounter (HOSPITAL_COMMUNITY)
Admission: RE | Disposition: A | Discharge status: Home / Self Care | Payer: Self-pay | Source: Home / Self Care | Attending: Internal Medicine

## 2023-12-09 ENCOUNTER — Ambulatory Visit (HOSPITAL_COMMUNITY)
Admission: RE | Admit: 2023-12-09 | Discharge: 2023-12-09 | Disposition: A | Discharge status: Home / Self Care | Payer: Medicare HMO | Attending: Internal Medicine | Admitting: Internal Medicine

## 2023-12-09 ENCOUNTER — Ambulatory Visit (HOSPITAL_COMMUNITY): Payer: Medicare HMO | Admitting: Anesthesiology

## 2023-12-09 ENCOUNTER — Encounter (HOSPITAL_COMMUNITY): Payer: Self-pay | Admitting: Internal Medicine

## 2023-12-09 DIAGNOSIS — D122 Benign neoplasm of ascending colon: Secondary | ICD-10-CM | POA: Insufficient documentation

## 2023-12-09 DIAGNOSIS — D649 Anemia, unspecified: Secondary | ICD-10-CM | POA: Insufficient documentation

## 2023-12-09 DIAGNOSIS — Z860101 Personal history of adenomatous and serrated colon polyps: Secondary | ICD-10-CM | POA: Diagnosis not present

## 2023-12-09 DIAGNOSIS — K635 Polyp of colon: Secondary | ICD-10-CM | POA: Diagnosis not present

## 2023-12-09 DIAGNOSIS — Z1211 Encounter for screening for malignant neoplasm of colon: Secondary | ICD-10-CM

## 2023-12-09 DIAGNOSIS — E119 Type 2 diabetes mellitus without complications: Secondary | ICD-10-CM | POA: Diagnosis not present

## 2023-12-09 DIAGNOSIS — I2510 Atherosclerotic heart disease of native coronary artery without angina pectoris: Secondary | ICD-10-CM | POA: Diagnosis not present

## 2023-12-09 DIAGNOSIS — Z9889 Other specified postprocedural states: Secondary | ICD-10-CM

## 2023-12-09 DIAGNOSIS — Z7984 Long term (current) use of oral hypoglycemic drugs: Secondary | ICD-10-CM | POA: Diagnosis not present

## 2023-12-09 DIAGNOSIS — I10 Essential (primary) hypertension: Secondary | ICD-10-CM | POA: Diagnosis not present

## 2023-12-09 DIAGNOSIS — Z87891 Personal history of nicotine dependence: Secondary | ICD-10-CM | POA: Diagnosis not present

## 2023-12-09 DIAGNOSIS — I252 Old myocardial infarction: Secondary | ICD-10-CM | POA: Insufficient documentation

## 2023-12-09 HISTORY — PX: COLONOSCOPY WITH PROPOFOL: SHX5780

## 2023-12-09 HISTORY — PX: BIOPSY: SHX5522

## 2023-12-09 HISTORY — PX: POLYPECTOMY: SHX149

## 2023-12-09 HISTORY — PX: SUBMUCOSAL LIFTING INJECTION: SHX6855

## 2023-12-09 LAB — BASIC METABOLIC PANEL
Anion gap: 12 (ref 5–15)
BUN: 7 mg/dL — ABNORMAL LOW (ref 8–23)
CO2: 25 mmol/L (ref 22–32)
Calcium: 9.9 mg/dL (ref 8.9–10.3)
Chloride: 101 mmol/L (ref 98–111)
Creatinine, Ser: 0.69 mg/dL (ref 0.44–1.00)
GFR, Estimated: >60 mL/min (ref >60)
Glucose, Bld: 333 mg/dL — ABNORMAL HIGH (ref 70–99)
Potassium: 3.9 mmol/L (ref 3.5–5.1)
Sodium: 138 mmol/L (ref 135–145)

## 2023-12-09 LAB — CBC WITH DIFFERENTIAL/PLATELET
Abs Immature Granulocytes: 0.01 10*3/uL (ref 0.00–0.07)
Basophils Absolute: 0 10*3/uL (ref 0.0–0.1)
Basophils Relative: 1 %
Eosinophils Absolute: 0.1 10*3/uL (ref 0.0–0.5)
Eosinophils Relative: 3 %
HCT: 39 % (ref 36.0–46.0)
Hemoglobin: 13.2 g/dL (ref 12.0–15.0)
Immature Granulocytes: 0 %
Lymphocytes Relative: 37 %
Lymphs Abs: 1.8 10*3/uL (ref 0.7–4.0)
MCH: 31.5 pg (ref 26.0–34.0)
MCHC: 33.8 g/dL (ref 30.0–36.0)
MCV: 93.1 fL (ref 80.0–100.0)
Monocytes Absolute: 0.4 10*3/uL (ref 0.1–1.0)
Monocytes Relative: 8 %
Neutro Abs: 2.5 10*3/uL (ref 1.7–7.7)
Neutrophils Relative %: 51 %
Platelets: 278 10*3/uL (ref 150–400)
RBC: 4.19 MIL/uL (ref 3.87–5.11)
RDW: 11.4 % — ABNORMAL LOW (ref 11.5–15.5)
WBC: 4.8 10*3/uL (ref 4.0–10.5)
nRBC: 0 % (ref 0.0–0.2)

## 2023-12-09 LAB — GLUCOSE, CAPILLARY
Glucose-Capillary: 328 mg/dL — ABNORMAL HIGH (ref 70–99)
Glucose-Capillary: 319 mg/dL — ABNORMAL HIGH (ref 70–99)
Glucose-Capillary: 171 mg/dL — ABNORMAL HIGH (ref 70–99)

## 2023-12-09 SURGERY — COLONOSCOPY WITH PROPOFOL
Anesthesia: General

## 2023-12-09 MED ORDER — PROPOFOL 500 MG/50ML IV EMUL
INTRAVENOUS | Status: DC | PRN
  Administered 2023-12-09: 50 mg via INTRAVENOUS
  Administered 2023-12-09: 150 ug/kg/min via INTRAVENOUS

## 2023-12-09 MED ORDER — LIDOCAINE HCL (CARDIAC) PF 100 MG/5ML IV SOSY
PREFILLED_SYRINGE | INTRAVENOUS | Status: DC | PRN
  Administered 2023-12-09: 40 mg via INTRAVENOUS

## 2023-12-09 MED ORDER — STERILE WATER FOR IRRIGATION IR SOLN
Status: DC | PRN
  Administered 2023-12-09: 100 mL

## 2023-12-09 MED ORDER — INSULIN ASPART 100 UNIT/ML IJ SOLN
10.0000 [IU] | Freq: Once | INTRAMUSCULAR | Status: AC
  Administered 2023-12-09: 10 [IU] via SUBCUTANEOUS
  Filled 2023-12-09: qty 0.1

## 2023-12-09 MED ORDER — LACTATED RINGERS IV SOLN: INTRAVENOUS | Status: DC

## 2023-12-09 NOTE — Discharge Instructions (Signed)
  Colonoscopy Discharge Instructions  Read the instructions outlined below and refer to this sheet in the next few weeks. These discharge instructions provide you with general information on caring for yourself after you leave the hospital. Your doctor may also give you specific instructions. While your treatment has been planned according to the most current medical practices available, unavoidable complications occasionally occur. If you have any problems or questions after discharge, call Dr. Jena Gauss at [phone]. ACTIVITY You may resume your regular activity, but move at a slower pace for the next 24 hours.  Take frequent rest periods for the next 24 hours.  Walking will help get rid of the air and reduce the bloated feeling in your belly (abdomen).  No driving for 24 hours (because of the medicine (anesthesia) used during the test).   Do not sign any important legal documents or operate any machinery for 24 hours (because of the anesthesia used during the test).  NUTRITION Drink plenty of fluids.  You may resume your normal diet as instructed by your doctor.  Begin with a light meal and progress to your normal diet. Heavy or fried foods are harder to digest and may make you feel sick to your stomach (nauseated).  Avoid alcoholic beverages for 24 hours or as instructed.  MEDICATIONS You may resume your normal medications unless your doctor tells you otherwise.  WHAT YOU CAN EXPECT TODAY Some feelings of bloating in the abdomen.  Passage of more gas than usual.  Spotting of blood in your stool or on the toilet paper.  IF YOU HAD POLYPS REMOVED DURING THE COLONOSCOPY: No aspirin products for 7 days or as instructed.  No alcohol for 7 days or as instructed.  Eat a soft diet for the next 24 hours.  FINDING OUT THE RESULTS OF YOUR TEST Not all test results are available during your visit. If your test results are not back during the visit, make an appointment with your caregiver to find out the  results. Do not assume everything is normal if you have not heard from your caregiver or the medical facility. It is important for you to follow up on all of your test results.  SEEK IMMEDIATE MEDICAL ATTENTION IF: You have more than a spotting of blood in your stool.  Your belly is swollen (abdominal distention).  You are nauseated or vomiting.  You have a temperature over 101.  You have abdominal pain or discomfort that is severe or gets worse throughout the day.         residual polyp removed  Further recommendations to follow pending review of pathology report   at patient request, I called Andrea Herrera at [phone]   reviewed findings and recommendations

## 2023-12-09 NOTE — Transfer of Care (Signed)
Immediate Anesthesia Transfer of Care Note  Patient: Andrea Herrera  Procedure(s) Performed: COLONOSCOPY WITH PROPOFOL SUBMUCOSAL LIFTING INJECTION POLYPECTOMY INTESTINAL BIOPSY  Patient Location: PACU  Anesthesia Type:General  Level of Consciousness: awake, alert , and oriented  Airway & Oxygen Therapy: Patient Spontanous Breathing  Post-op Assessment: Report given to RN and Post -op Vital signs reviewed and stable  Post vital signs: Reviewed and stable  Last Vitals:  Vitals Value Taken Time  BP 123/53 12/09/23 1252  Temp 36.4 C 12/09/23 1252  Pulse 71 12/09/23 1252  Resp 16 12/09/23 1252  SpO2 99 % 12/09/23 1252    Last Pain:  Vitals:   12/09/23 1252  TempSrc: Axillary  PainSc: 0-No pain         Complications: No notable events documented.

## 2023-12-09 NOTE — H&P (Signed)
@LOGO @   Primary Care Physician:  Lawerance Sabal, [place] Primary Gastroenterologist:  Dr. Jena Gauss  Pre-Procedure History & Physical: HPI:  Andrea Herrera is a 73 y.o. female here for  surveillance for a large carpet polyp ascending colon removed piecemeal last year.  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  She is here for early interval follow-up per plan.  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 Received 2 DES to RCA. EF was normal.   Diabetes mellitus without complication (HCC)    GERD (gastroesophageal reflux disease)    HLD (hyperlipidemia)    Hypertension    Myocardial infarction [facility])     Past Surgical History:  Procedure Laterality Date   ABDOMINAL HYSTERECTOMY     BREAST BIOPSY Right 11/2021   SCLEROTIC FIBROADENOMATOID NODULE WITH DYSTROPHIC CALCIFICATIONS- NO MALIGNANCY   BREAST BIOPSY Right 08/2017   BENIGN BREAST TISSUE WITH HYALINIZATION AND FOCAL CALCIFICATION   BREAST BIOPSY Right 07/2012   hyalinized fibroadenoma with calcifications   BREAST BIOPSY Left    benign-pt unsure when   CARDIAC CATHETERIZATION N/A 04/26/2015   Procedure: Left Heart Cath and Coronary Angiography;  Surgeon: Runell Gess, MD;  Location: [facility] INVASIVE CV LAB;  Service: Cardiovascular;  Laterality: N/A;   cardiac stents  2016   x 2   COLONOSCOPY WITH PROPOFOL N/A 08/17/2023   Procedure: COLONOSCOPY WITH PROPOFOL;  Surgeon: Corbin Ade, MD;  Location: [facility] ENDO [facility];  Service: Endoscopy;  Laterality: N/A;  11:00am, asa 3   ESOPHAGOGASTRODUODENOSCOPY (EGD) WITH PROPOFOL N/A 08/17/2023   Procedure: ESOPHAGOGASTRODUODENOSCOPY (EGD) WITH PROPOFOL;  Surgeon: Corbin Ade, MD;  Location: [facility];  Service: Endoscopy;  Laterality: N/A;   MALONEY DILATION N/A 08/17/2023   Procedure: Elease Hashimoto DILATION;  Surgeon: Corbin Ade,  MD;  Location: [facility] ENDO [facility];  Service: Endoscopy;  Laterality: N/A;   POLYPECTOMY  08/17/2023   Procedure: POLYPECTOMY;  Surgeon: Corbin Ade, MD;  Location: [facility];  Service: Endoscopy;;   SCLEROTHERAPY  08/17/2023   Procedure: Susa Day;  Surgeon: Corbin Ade, MD;  Location: [facility];  Service: Endoscopy;;   SUBMUCOSAL TATTOO INJECTION  08/17/2023   Procedure: SUBMUCOSAL TATTOO INJECTION;  Surgeon: Corbin Ade, MD;  Location: [facility];  Service: Endoscopy;;    Prior to Admission medications   Medication Sig Start Date End Date Taking? Authorizing Provider  acetaminophen (TYLENOL) 650 MG CR tablet Take 650 mg by mouth 2 (two) times daily as needed for pain.   Yes [provider]  amLODipine (NORVASC) 10 MG tablet Take 1 tablet by mouth once daily 07/07/20  Yes Branch, Dorothe Pea, MD  atorvastatin (LIPITOR) 80 MG tablet TAKE 1 TABLET BY MOUTH ONCE DAILY AT  6  PM 12/21/21  Yes Branch, Dorothe Pea, MD  cholecalciferol (VITAMIN D) 1000 UNITS tablet Take 1,000 Units by mouth in the morning.   Yes [provider]  Ferrous Sulfate (IRON PO) Take 1 tablet by mouth daily.   Yes [provider]  gabapentin (NEURONTIN) 100 MG capsule Take 100 mg by mouth daily as needed.   Yes [provider]  glipiZIDE (GLUCOTROL XL) 5 MG 24 hr tablet Take 5 mg by mouth daily. 06/13/22  Yes [provider]  lisinopril (ZESTRIL) 5 MG tablet Take 1 tablet  by mouth once daily 09/05/23  Yes Branch, Dorothe Pea, MD  Magnesium 250 MG TABS Take 250 mg by mouth daily.   Yes [provider]  metFORMIN (GLUCOPHAGE-XR) 500 MG 24 hr tablet Take 1,000 mg by mouth 2 (two) times daily with a meal. 02/10/15  Yes [provider]  metoprolol tartrate (LOPRESSOR) 25 MG tablet Take 1 tablet by mouth twice daily 07/25/23  Yes Branch, Dorothe Pea, MD  Multiple Vitamin (MULTIVITAMIN) tablet Take 1 tablet by mouth in the morning.   Yes [provider]  pantoprazole (PROTONIX) 40 MG tablet Take 1 tablet by mouth once daily 10/10/23  Yes [name], [name], NP  POTASSIUM PO Take 99 mg by mouth every other day. In the morning.   Yes [provider]  empagliflozin (JARDIANCE) 25 MG TABS tablet Take 10 mg by mouth daily.    [provider]  nitroGLYCERIN (NITROSTAT) 0.4 MG SL tablet Place 1 tablet (0.4 mg total) under the tongue every 5 (five) minutes x 3 doses as needed for chest pain. 12/07/19   Antoine Poche, MD    Allergies as of 10/27/2023   (No Known Allergies)    Family History  Problem Relation Age of Onset   Colon cancer Mother    Lung cancer Father    Stroke Father    Pancreatic cancer Brother    Heart disease Paternal Grandmother    Stroke Paternal Grandmother    Heart disease Paternal Grandfather    Stroke Paternal Grandfather     Social History   Socioeconomic History   Marital status: Divorced    Spouse name: Not on file   Number of children: Not on file   Years of education: Not on file   Highest education level: Not on file  Occupational History   Not on file  Tobacco Use   Smoking status: Former    Current packs/day: 0.00    Average packs/day: 0.3 packs/day for 27.6 years (6.9 ttl pk-yrs)    Types: Cigarettes    Start date: 09/11/1987    Quit date: 04/21/2015    Years since quitting: 8.6    Passive exposure: Never   Smokeless tobacco: Never  Vaping Use   Vaping status: Never Used  Substance and Sexual Activity   Alcohol use: No    Alcohol/week: 0.0 standard drinks of alcohol   Drug use: No   Sexual activity: Not on file  Other Topics Concern   Not on file  Social History Narrative   Not on file   Social Drivers of Health   Financial Resource Strain: Not on file  Food Insecurity: Not on file  Transportation Needs: Not on file  Physical Activity: Not on file  Stress: Not on file  Social Connections: Not on file  Intimate Partner Violence: Not on file     Review of Systems: See HPI, otherwise negative ROS  Physical Exam: BP 135/67 (BP Location: Right Arm)   Pulse 66   Temp 98.1 F (36.7 C)   Resp 16   Ht 5' (1.524 m)   Wt 59.9 kg   SpO2 99%   BMI 25.78 kg/m  General:   Alert,  Well-developed, well-nourished, pleasant and cooperative in NAD Neck:  Supple; no masses or thyromegaly. No significant cervical adenopathy. Lungs:  Clear throughout to auscultation.   No wheezes, crackles, or rhonchi. No acute distress. Heart:  Regular rate and rhythm; no murmurs, clicks, rubs,  or gallops. Abdomen: Non-distended, normal bowel sounds.  Soft and nontender without appreciable mass or hepatosplenomegaly.   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  She is here for an early interval surveillance examination per plan.  The risks, benefits, limitations, alternatives and imponderables have been reviewed with the patient. Questions have been answered. All parties are agreeable.       Notice: This dictation was prepared with Dragon dictation along with smaller phrase technology. Any transcriptional errors that result from this process are unintentional and may not be corrected upon review.

## 2023-12-09 NOTE — Anesthesia Preprocedure Evaluation (Signed)
 Anesthesia Evaluation  Patient identified by MRN, date of birth, ID band Patient awake    Reviewed: Allergy & Precautions, H&P , NPO status , Patient's Chart, lab work & pertinent test results, reviewed documented beta blocker date and time   Airway Mallampati: II  TM Distance: >3 FB Neck ROM: full    Dental no notable dental hx.    Pulmonary neg pulmonary ROS, former smoker   Pulmonary exam normal breath sounds clear to auscultation       Cardiovascular Exercise Tolerance: Good hypertension, + CAD and + Past MI  negative cardio ROS  Rhythm:regular Rate:Normal     Neuro/Psych negative neurological ROS  negative psych ROS   GI/Hepatic negative GI ROS, Neg liver ROS,GERD  ,,  Endo/Other  negative endocrine ROSdiabetes    Renal/GU negative Renal ROS  negative genitourinary   Musculoskeletal   Abdominal   Peds  Hematology negative hematology ROS (+) Blood dyscrasia, anemia   Anesthesia Other Findings   Reproductive/Obstetrics negative OB ROS                             Anesthesia Physical Anesthesia Plan  ASA: 3  Anesthesia Plan: General   Post-op Pain Management:    Induction:   PONV Risk Score and Plan: Propofol infusion  Airway Management Planned:   Additional Equipment:   Intra-op Plan:   Post-operative Plan:   Informed Consent: I have reviewed the patients History and Physical, chart, labs and discussed the procedure including the risks, benefits and alternatives for the proposed anesthesia with the patient or authorized representative who has indicated his/her understanding and acceptance.     Dental Advisory Given  Plan Discussed with: CRNA  Anesthesia Plan Comments:        Anesthesia Quick Evaluation

## 2023-12-09 NOTE — Op Note (Signed)
[facility] Patient Name: Andrea Herrera Procedure Date: 12/09/2023 11:31 AM MRN: [id number] Date of Birth: 01-10-1951 Attending MD: Gennette Pac , MD, [id number] CSN: [id number] Age: 73 Admit Type: Outpatient Procedure:                Colonoscopy-follow-up piecemeal ascending colon                            polypectomy 4 months ago Indications:              High risk colon cancer surveillance: Personal                            history of colonic polyps Providers:                Gennette Pac, MD, Angelica Ran, Elinor Parkinson Referring MD:              Medicines:                Propofol per Anesthesia Complications:            No immediate complications. Estimated Blood Loss:     Estimated blood loss was minimal. Procedure:                Pre-Anesthesia Assessment:                           - Prior to the procedure, a History and Physical                            was performed, and patient medications and                            allergies were reviewed. The patient's tolerance of                            previous anesthesia was also reviewed. The risks                            and benefits of the procedure and the sedation                            options and risks were discussed with the patient.                            All questions were answered, and informed consent                            was obtained. Prior Anticoagulants: The patient has                            taken no anticoagulant or antiplatelet agents. ASA  Grade Assessment: III - A patient with severe                            systemic disease. After reviewing the risks and                            benefits, the patient was deemed in satisfactory                            condition to undergo the procedure.                           After obtaining informed consent, the colonoscope                            was passed  under direct vision. Throughout the                            procedure, the patient's blood pressure, pulse, and                            oxygen saturations were monitored continuously. The                            PCF-HQ190L ([id number]) was introduced through the                            anus and advanced to the the cecum, identified by                            appendiceal orifice and ileocecal valve. The                            colonoscopy was performed without difficulty. The                            patient tolerated the procedure well. The quality                            of the bowel preparation was adequate. The                            ileocecal valve, appendiceal orifice, and rectum                            were photographed. Scope In: 11:52:46 AM Scope Out: 12:48:27 PM Scope Withdrawal Time: 0 hours 46 minutes 54 seconds  Total Procedure Duration: 0 hours 55 minutes 41 seconds  Findings:      The perianal and digital rectal examinations were normal.      Scope advanced to the ascending segment to site of previous piecemeal       polypectomy. This was easily identified by the presence of some residual       polyp tissue and the tattoo in the foreground. I proceeded on to  the       cecum and cleared the upstream colon to the cecum utilizing the       pediatric colonoscope.      Inspected the persisting lesion - there was clearly some residual polyp       tissue that laid over a fold on the backside - it was difficult to       on?"face. However, today I had the pediatric colonoscope and was able to       retroflex in the ascending segment providing a good look; there was a       1-1/2 x 1-1/2 cm area of residual polyp on the backside of the fold..       Please see photos. This area was nicely lifted 1 cc of Ella view.       Subsequently, using the 30 mm snare I took this segment off cleanly. I       went back to the on?"face view and found a couple of residual  areas;       utilizing a 10 mm rotatable snare, small persisting areas were       resected.. Finally, the periphery of this polyp site was ablated with       the tip of the hot snare loop; small amount of residual tissue within       the polypectomy site was avulsed and ablated with hot biopsy forcep.       Good hemostasis maintained throughout this procedure. Because of the       sprawling nature of this polyp, I did not feel like I could place a clip       in a good position and elected not to attempt. The remainder colonic       mucosa appeared normal. Impression:               Ascending colon polyp polypectomy with Samson Frederic view                            assist. Avulsion and ablation-multiple specimens                            obtained for the pathologist. Moderate Sedation:      Moderate (conscious) sedation was personally administered by an       anesthesia professional. The following parameters were monitored: oxygen       saturation, heart rate, blood pressure, respiratory rate, EKG, adequacy       of pulmonary ventilation, and response to care. Recommendation:           - Patient has a contact number available for                            emergencies. The signs and symptoms of potential                            delayed complications were discussed with the                            patient. Return to normal activities tomorrow.                            Written discharge instructions were  provided to the                            patient.                           - Advance diet as tolerated. Follow-up on                            pathology. Further recommendations to follow. Procedure Code(s):        --- Professional ---                           [phone], Colonoscopy, flexible; diagnostic, including                            collection of specimen(s) by brushing or washing,                            when performed (separate procedure) Diagnosis Code(s):        ---  Professional ---                           Z86.010, Personal history of colonic polyps CPT copyright 2022 [employer]. All rights reserved. The codes documented in this report are preliminary and upon coder review may  be revised to meet current compliance requirements. Gerrit Friends. Dannel Rafter, MD Gennette Pac, MD 12/09/2023 5:54:15 PM This report has been signed electronically. Number of Addenda: 0

## 2023-12-10 DIAGNOSIS — Z1211 Encounter for screening for malignant neoplasm of colon: Secondary | ICD-10-CM

## 2023-12-10 DIAGNOSIS — D122 Benign neoplasm of ascending colon: Secondary | ICD-10-CM | POA: Diagnosis not present

## 2023-12-10 DIAGNOSIS — I2510 Atherosclerotic heart disease of native coronary artery without angina pectoris: Secondary | ICD-10-CM | POA: Diagnosis not present

## 2023-12-10 DIAGNOSIS — Z87891 Personal history of nicotine dependence: Secondary | ICD-10-CM

## 2023-12-10 NOTE — Anesthesia Postprocedure Evaluation (Signed)
 Anesthesia Post Note  Patient: Andrea Herrera  Procedure(s) Performed: COLONOSCOPY WITH PROPOFOL SUBMUCOSAL LIFTING INJECTION POLYPECTOMY INTESTINAL BIOPSY  Patient location during evaluation: Phase II Anesthesia Type: General Level of consciousness: awake Pain management: pain level controlled Vital Signs Assessment: post-procedure vital signs reviewed and stable Respiratory status: spontaneous breathing and respiratory function stable Cardiovascular status: blood pressure returned to baseline and stable Postop Assessment: no headache and no apparent nausea or vomiting Anesthetic complications: no Comments: Late entry   No notable events documented.   Last Vitals:  Vitals:   12/09/23 0958 12/09/23 1252  BP: 135/67 (!) 123/53  Pulse: 66 71  Resp: 16 16  Temp: 36.7 C 36.4 C  SpO2: 99% 99%    Last Pain:  Vitals:   12/09/23 1252  TempSrc: Axillary  PainSc: 0-No pain                 Windell Norfolk

## 2023-12-12 ENCOUNTER — Encounter (HOSPITAL_COMMUNITY): Payer: Self-pay | Admitting: Internal Medicine

## 2023-12-12 LAB — SURGICAL PATHOLOGY

## 2023-12-15 ENCOUNTER — Encounter: Payer: Self-pay | Admitting: Internal Medicine

## 2024-01-03 ENCOUNTER — Other Ambulatory Visit: Payer: Self-pay | Admitting: Orthopedic Surgery

## 2024-02-01 ENCOUNTER — Ambulatory Visit: Payer: Medicare HMO | Attending: Cardiology | Admitting: Cardiology

## 2024-02-01 ENCOUNTER — Encounter: Payer: Self-pay | Admitting: Cardiology

## 2024-02-01 VITALS — BP 132/80 | HR 83 | Ht 60.0 in | Wt 132.2 lb

## 2024-02-01 DIAGNOSIS — E782 Mixed hyperlipidemia: Secondary | ICD-10-CM

## 2024-02-01 DIAGNOSIS — I2510 Atherosclerotic heart disease of native coronary artery without angina pectoris: Secondary | ICD-10-CM | POA: Diagnosis not present

## 2024-02-01 DIAGNOSIS — I10 Essential (primary) hypertension: Secondary | ICD-10-CM

## 2024-02-01 MED ORDER — AMLODIPINE BESYLATE 10 MG PO TABS: 10.0000 mg | ORAL_TABLET | Freq: Every day | ORAL | 1 refills | Status: DC

## 2024-02-01 MED ORDER — LISINOPRIL 5 MG PO TABS: 5.0000 mg | ORAL_TABLET | Freq: Every day | ORAL | 1 refills | Status: DC

## 2024-02-01 NOTE — Progress Notes (Signed)
 Clinical Summary Andrea Herrera is a 73 y.o.female seen today for follow up of the following medical problems.    1. CAD - admit 04/2015 with inferior STEMI, s/p DES x 2 to RCA. LVEF 55-60% by echo   06/2020 echo LVEF 60-65%, indet DDx, normal RV function      04/2022 nuclear stress: no ischemia. - denies any chest pains. Some SOB at times, can occur with talking or singing. Some SOB with housework. Symptoms overall chronic and stable - of note prior PFTs were benign in 2016   - no chest pains. Mild SOB that is improving - compliant with meds       2. HTN - compliant with meds      3. Hyperlipidemia - she is on atorvastatin 80mg  daily   03/2021 TC 99 TG 60 HDL 56 LDL 31 08/2021 TC 84 TG 77 HDL 46 LDL 22 - reports more recent labs with pcp  4. 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 Received 2 DES to RCA. EF was normal.   Diabetes mellitus without complication (HCC)    GERD (gastroesophageal reflux disease)    HLD (hyperlipidemia)    Hypertension    Myocardial infarction (HCC)      No Known Allergies   Current Outpatient Medications  Medication Sig Dispense Refill   acetaminophen (TYLENOL) 650 MG CR tablet Take 650 mg by mouth 2 (two) times daily as needed for pain.     amLODipine (NORVASC) 10 MG tablet Take 1 tablet by mouth once daily 90 tablet 1   atorvastatin (LIPITOR) 80 MG tablet TAKE 1 TABLET BY MOUTH ONCE DAILY AT  6  PM 90 tablet 3   cholecalciferol (VITAMIN D) 1000 UNITS tablet Take 1,000 Units by mouth in the morning.     empagliflozin (JARDIANCE) 25 MG TABS tablet Take 10 mg by mouth daily.     Ferrous Sulfate (IRON PO) Take 1 tablet by mouth daily.     gabapentin (NEURONTIN) 100 MG capsule TAKE 2 CAPSULES BY MOUTH THREE TIMES DAILY 90 capsule 0   glipiZIDE (GLUCOTROL XL) 5 MG 24 hr tablet  Take 5 mg by mouth daily.     lisinopril (ZESTRIL) 5 MG tablet Take 1 tablet by mouth once daily 30 tablet 4   Magnesium 250 MG TABS Take 250 mg by mouth daily.     metFORMIN (GLUCOPHAGE-XR) 500 MG 24 hr tablet Take 1,000 mg by mouth 2 (two) times daily with a meal.     metoprolol tartrate (LOPRESSOR) 25 MG tablet Take 1 tablet by mouth twice daily 180 tablet 1   Multiple Vitamin (MULTIVITAMIN) tablet Take 1 tablet by mouth in the morning.     nitroGLYCERIN (NITROSTAT) 0.4 MG SL tablet Place 1 tablet (0.4 mg total) under the tongue every 5 (five) minutes x 3 doses as needed for chest pain. 25 tablet 3   pantoprazole (PROTONIX) 40 MG tablet Take 1 tablet by mouth once daily 30 tablet 5   POTASSIUM PO Take 99 mg by mouth every other day. In the morning.     No current facility-administered medications for this visit.     Past Surgical History:  Procedure Laterality Date   ABDOMINAL HYSTERECTOMY     BIOPSY  12/09/2023  Procedure: BIOPSY;  Surgeon: Corbin Ade, MD;  Location: [facility];  Service: Endoscopy;;   BREAST BIOPSY Right 11/2021   SCLEROTIC FIBROADENOMATOID NODULE WITH DYSTROPHIC CALCIFICATIONS- NO MALIGNANCY   BREAST BIOPSY Right 08/2017   BENIGN BREAST TISSUE WITH HYALINIZATION AND FOCAL CALCIFICATION   BREAST BIOPSY Right 07/2012   hyalinized fibroadenoma with calcifications   BREAST BIOPSY Left    benign-pt unsure when   CARDIAC CATHETERIZATION N/A 04/26/2015   Procedure: Left Heart Cath and Coronary Angiography;  Surgeon: Runell Gess, MD;  Location: [facility] INVASIVE CV LAB;  Service: Cardiovascular;  Laterality: N/A;   cardiac stents  2016   x 2   COLONOSCOPY WITH PROPOFOL N/A 08/17/2023   Procedure: COLONOSCOPY WITH PROPOFOL;  Surgeon: Corbin Ade, MD;  Location: [facility];  Service: Endoscopy;  Laterality: N/A;  11:00am, asa 3   COLONOSCOPY WITH PROPOFOL N/A 12/09/2023   Procedure: COLONOSCOPY WITH PROPOFOL;  Surgeon: Corbin Ade, MD;  Location: [facility];  Service: Endoscopy;  Laterality: N/A;  10:45 AM, ASA 3   ESOPHAGOGASTRODUODENOSCOPY (EGD) WITH PROPOFOL N/A 08/17/2023   Procedure: ESOPHAGOGASTRODUODENOSCOPY (EGD) WITH PROPOFOL;  Surgeon: Corbin Ade, MD;  Location: [facility];  Service: Endoscopy;  Laterality: N/A;   MALONEY DILATION N/A 08/17/2023   Procedure: Elease Hashimoto DILATION;  Surgeon: Corbin Ade, MD;  Location: [facility];  Service: Endoscopy;  Laterality: N/A;   POLYPECTOMY  08/17/2023   Procedure: POLYPECTOMY;  Surgeon: Corbin Ade, MD;  Location: [facility];  Service: Endoscopy;;   POLYPECTOMY  12/09/2023   Procedure: POLYPECTOMY INTESTINAL;  Surgeon: Corbin Ade, MD;  Location: [facility];  Service: Endoscopy;;   SCLEROTHERAPY  08/17/2023   Procedure: Susa Day;  Surgeon: Corbin Ade, MD;  [facility]: [facility];  Service: Endoscopy;;   SUBMUCOSAL LIFTING INJECTION  12/09/2023   Procedure: SUBMUCOSAL LIFTING INJECTION;  Surgeon: Corbin Ade, MD;  Location: [facility];  Service: Endoscopy;;   SUBMUCOSAL TATTOO INJECTION  08/17/2023   Procedure: SUBMUCOSAL TATTOO INJECTION;  Surgeon: Corbin Ade, MD;  Location: [facility];  Service: Endoscopy;;     No Known Allergies    Family History  Problem Relation Age of Onset   Colon cancer Mother    Lung cancer Father    Stroke Father    Pancreatic cancer Brother    Heart disease Paternal Grandmother    Stroke Paternal Grandmother    Heart disease Paternal Grandfather    Stroke Paternal Grandfather      Social History Andrea Herrera reports that she quit smoking about 8 years ago. Her smoking use included cigarettes. She started smoking about 36 years ago. She has a 6.9 pack-year smoking history. She has never been exposed to tobacco smoke. She has never used smokeless tobacco. Andrea Herrera reports no history of alcohol use.      Physical Examination Vitals:   02/01/24 1535  BP: 132/80  Pulse: 83  SpO2: 99%    Filed Weights   02/01/24 1535  Weight: 132 lb 3.2 oz (60 kg)    Gen: resting comfortably, no acute distress HEENT: no scleral icterus, pupils equal round and reactive, no palptable cervical adenopathy,  CV: RRR, no m/rg, no jvd Resp: Clear to auscultation bilaterally GI: abdomen is soft, non-tender, non-distended, normal bowel sounds, no hepatosplenomegaly MSK: extremities are warm, no edema.  Skin: warm, no rash Neuro:  no focal deficits Psych: appropriate affect   Diagnostic Studies  04/2015 Cath Mid RCA lesion, 99% stenosed. There is a 0% residual stenosis post intervention. Prox RCA to Mid RCA lesion, 75% stenosed. There is a 0% residual stenosis post intervention. The left ventricular systolic function is normal.       04/2015 Echo - Left ventricle: The cavity size was normal. Systolic function was   normal. The estimated ejection fraction was in the range of 55%   to 60%. Wall motion was normal; there were no regional wall   motion abnormalities. Doppler parameters are consistent with   abnormal left ventricular relaxation (grade 1 diastolic   dysfunction).   09/2015 PFTs No COPD   05/2019 ABIs Summary:  Right: Resting right ankle-brachial index is within normal range. No  evidence of significant right lower extremity arterial disease. The right  toe-brachial index is normal.   Left: Resting left ankle-brachial index is within normal range. No  evidence of significant left lower extremity arterial disease. The left  toe-brachial index is normal.    06/2020 echo IMPRESSIONS     1. Left ventricular ejection fraction, by estimation, is 60 to 65%. The  left ventricle has normal function. The left ventricle has no regional  wall motion abnormalities. Left ventricular diastolic parameters are  indeterminate.   2. Right ventricular systolic function is normal. The right ventricular  size is normal. There is normal pulmonary artery systolic pressure. The   estimated right ventricular systolic pressure is 23.4 mmHg.   3. The mitral valve is grossly normal. Trivial mitral valve  regurgitation.   4. The aortic valve is tricuspid. Aortic valve regurgitation is not  visualized. Mild aortic valve sclerosis is present, with no evidence of  aortic valve stenosis.   5. The inferior vena cava is normal in size with greater than 50%  respiratory variability, suggesting right atrial pressure of 3 mmHg.        Assessment and Plan   1.CAD - no recent symptoms, continue current meds     2. HTN - her bp is at goal, continue current meds   3.Hyperlipidemia - she is to f/u with pcp and get update labs,  continue current meds    Laurann Pollock, M.D.

## 2024-02-01 NOTE — Patient Instructions (Signed)
 Medication Instructions:  Continue all current medications.   Labwork: none  Testing/Procedures: none  Follow-Up: 6 months   Any Other Special Instructions Will Be Listed Below (If Applicable).   If you need a refill on your cardiac medications before your next appointment, please call your pharmacy.

## 2024-02-05 IMAGING — MG MM BREAST LOCALIZATION CLIP
4 series · 5 of 12 positions shown · non-contrast
Comparison: Previous exams.
COMPARISON: Previous exams.

Addendum:
CLINICAL DATA: 70-year-old with a screening detected 9 mm group of
indeterminate calcifications involving the LOWER OUTER QUADRANT of
the LEFT breast at middle to posterior depth.

EXAM:
LEFT BREAST STEREOTACTIC CORE NEEDLE BIOPSY

[L CC synth-2D]
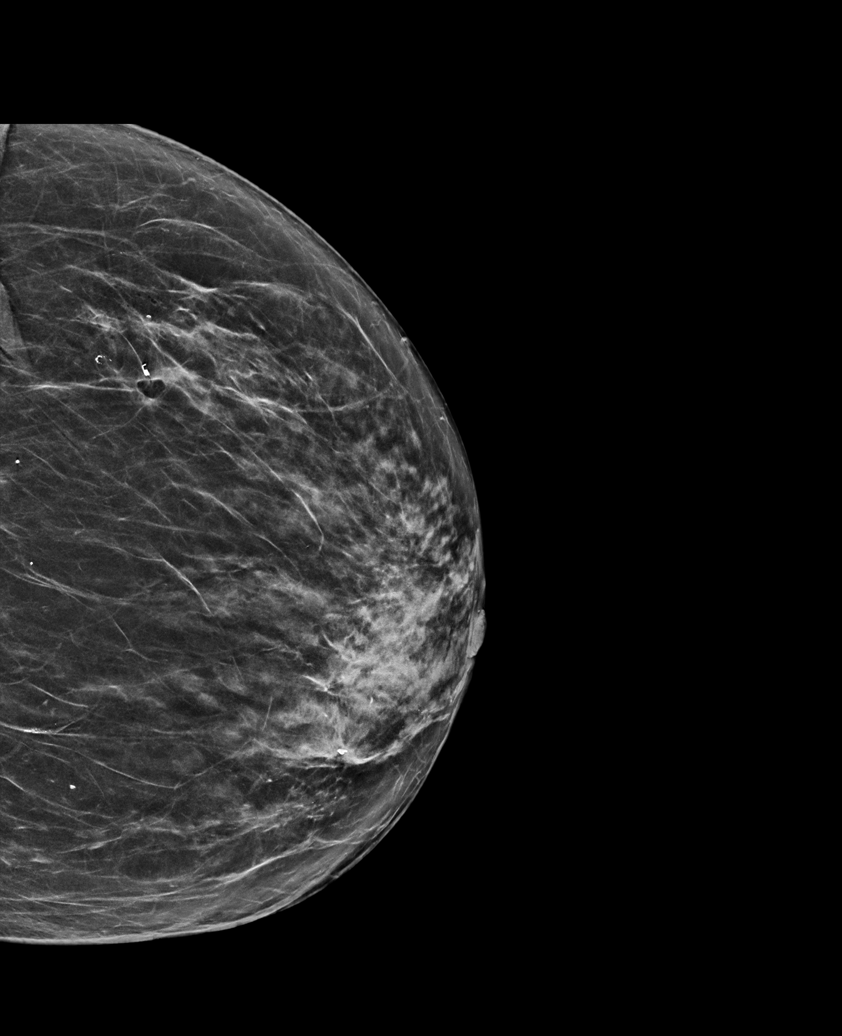

[L LM synth-2D]
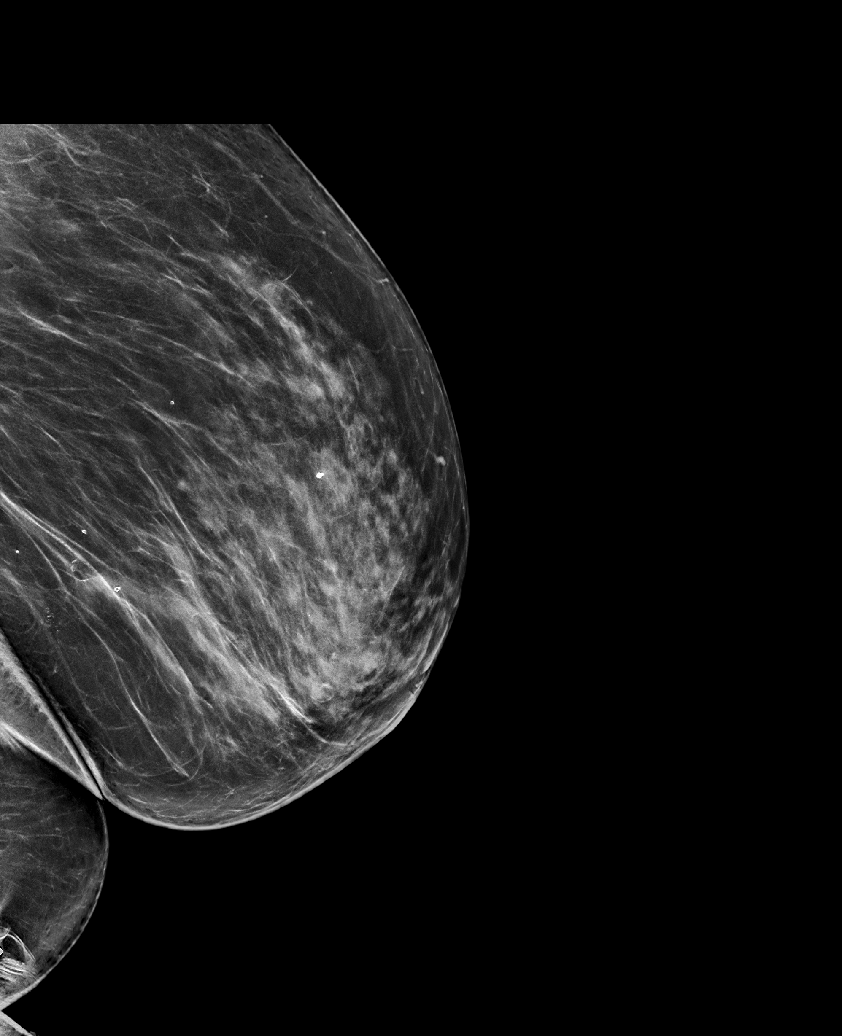

[L CC tomo · 2 of 68 frames shown]
[frame 22/68]
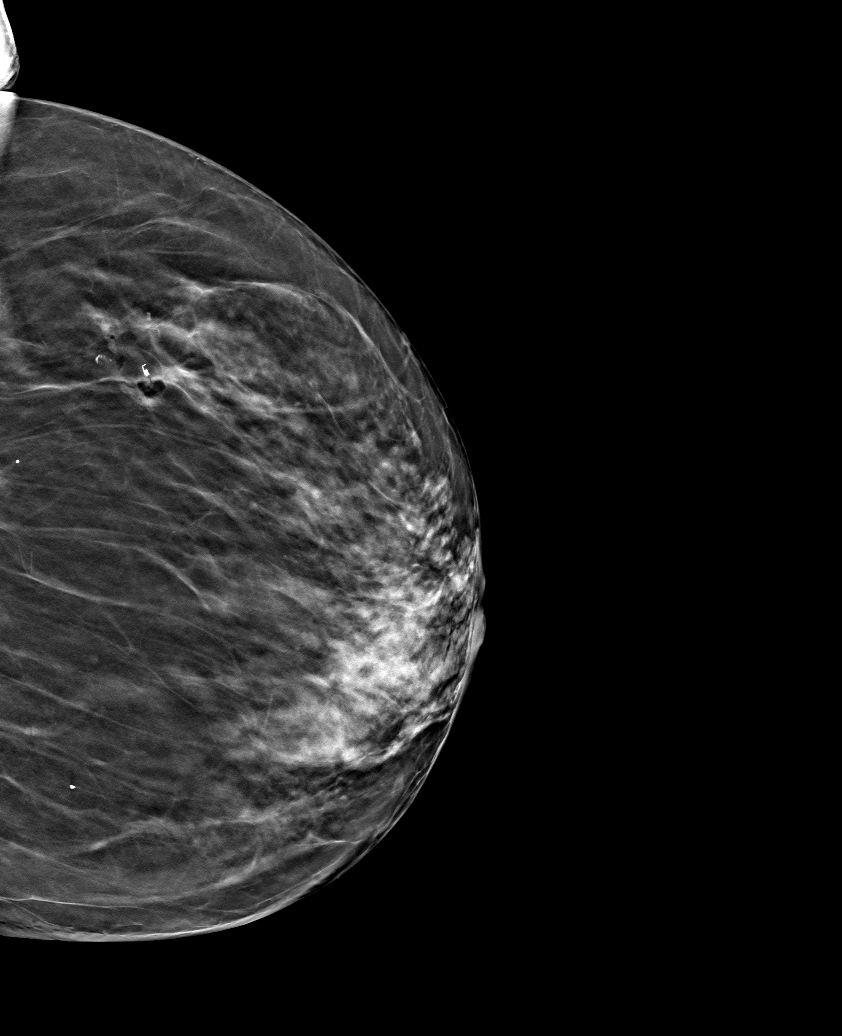
[frame 35/68]
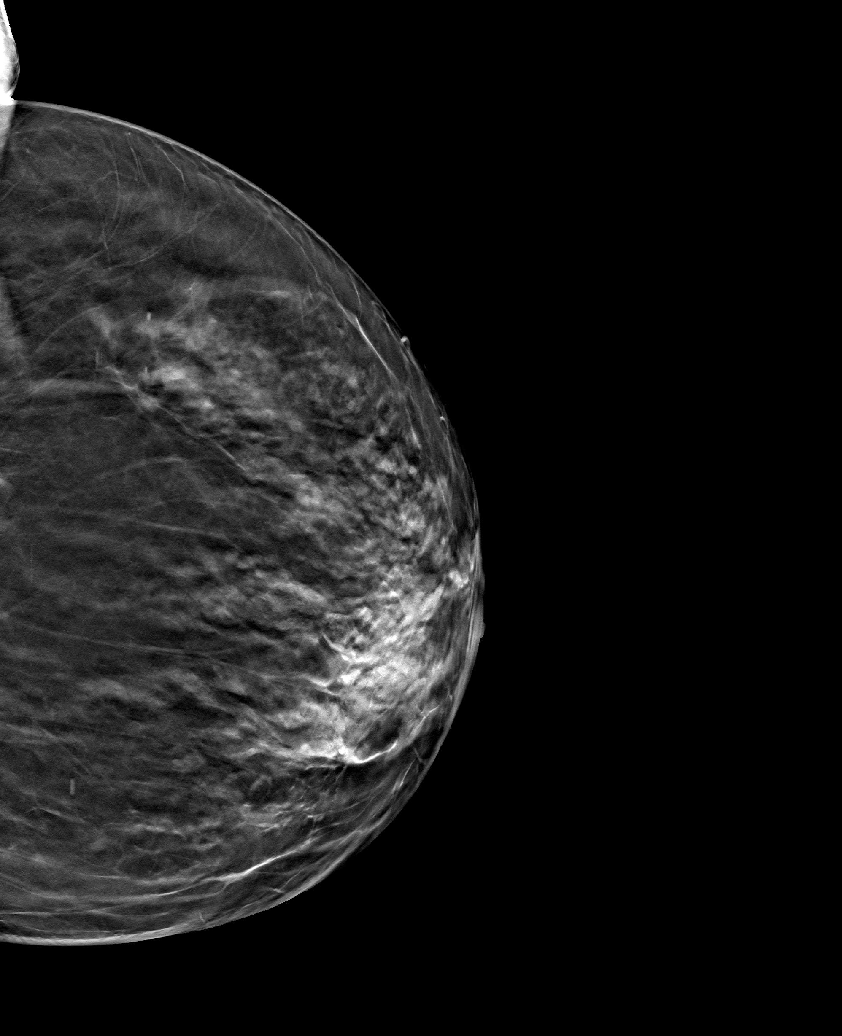

[L LM tomo · tomo slice 43/85.0]
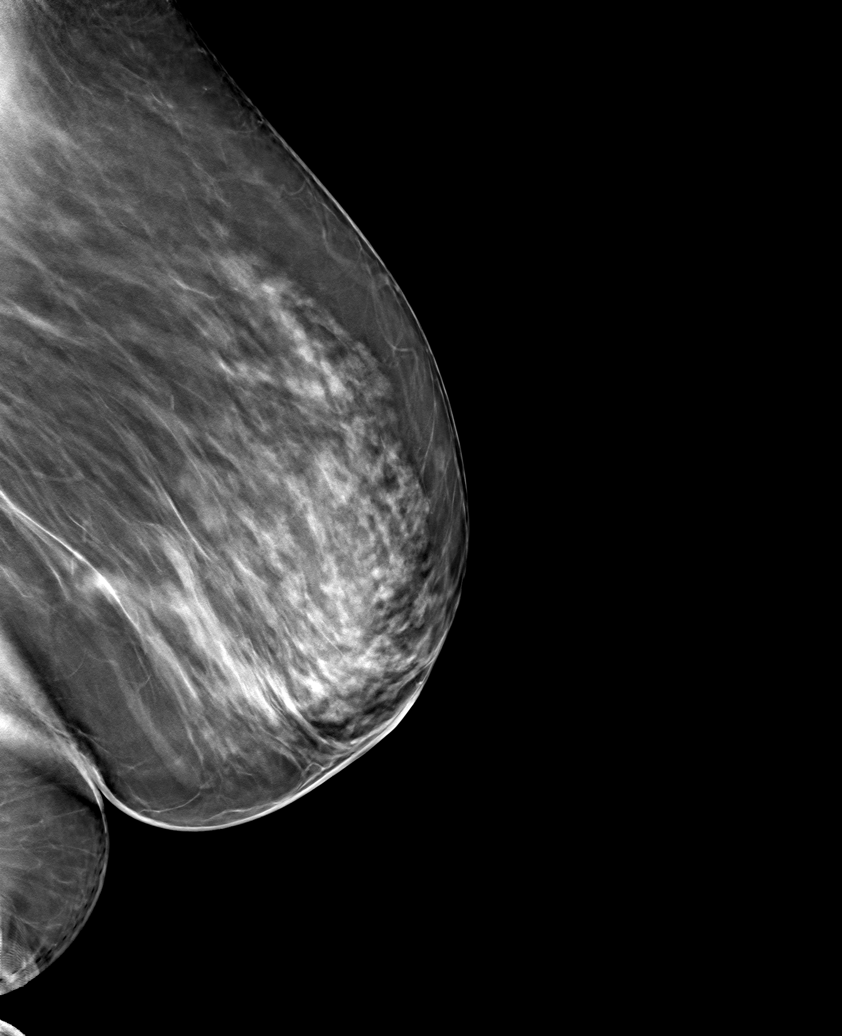

[5 of 12 positions shown; findings below may reference images not displayed]



Lesion quadrant: LOWER OUTER QUADRANT.

Using sterile technique with chlorhexidine as skin antisepsis, 1%
lidocaine and 1% lidocaine with epinephrine as local anesthetic,
under stereotactic tomosynthesis guidance, a 9 gauge Brevera vacuum
assisted device was used to perform core needle biopsy of
calcifications involving the lower quadrant using a lateral
approach. Specimen radiograph was performed showing calcifications
in at least 2 of the core samples. Specimens with calcifications are
identified for pathology. At the conclusion of the procedure, a coil
shaped tissue marker clip was deployed into the biopsy cavity.

Follow-up 2D and 3D full field CC and mediolateral mammographic
images were obtained to confirm clip placement. The coil shaped clip
is appropriately position at the anterior margin of the biopsied
calcifications. Residual calcifications extend 1.3 cm posterior to
the clip. Expected post biopsy changes present without evidence of
hematoma.
IMPRESSION: 1. Stereotactic tomosynthesis core needle biopsy of calcifications
involving the LOWER OUTER QUADRANT the LEFT breast at middle to
posterior depth. No apparent complications.
2. Appropriate positioning of the coil shaped tissue marking clip at
the anterior margin of the biopsied calcifications. Residual
calcifications extend approximately 1.3 cm posterior to the clip.

ADDENDUM:
Pathology revealed SCLEROTIC FIBROADENOMATOID NODULE WITH DYSTROPHIC
CALCIFICATIONS- NO MALIGNANCY IDENTIFIED of the LEFT breast, lower
outer quadrant (coil clip). This was found to be concordant by Dr.
Silas Lalanne.

Pathology results were discussed with the patient by telephone. The
patient reported doing well after the biopsy with tenderness at the
site. Post biopsy instructions and care were reviewed and questions
were answered. The patient was encouraged to call The [REDACTED]

The patient was instructed to return for annual screening
mammography ([HOSPITAL]) due in October 2022, and was informed a
reminder notice would be sent regarding this appointment.

Pathology results reported by Camen Niampira RN on 12/07/2021.



Lesion quadrant: LOWER OUTER QUADRANT.

Using sterile technique with chlorhexidine as skin antisepsis, 1%
lidocaine and 1% lidocaine with epinephrine as local anesthetic,
under stereotactic tomosynthesis guidance, a 9 gauge Brevera vacuum
assisted device was used to perform core needle biopsy of
calcifications involving the lower quadrant using a lateral
approach. Specimen radiograph was performed showing calcifications
in at least 2 of the core samples. Specimens with calcifications are
identified for pathology. At the conclusion of the procedure, a coil
shaped tissue marker clip was deployed into the biopsy cavity.

Follow-up 2D and 3D full field CC and mediolateral mammographic
images were obtained to confirm clip placement. The coil shaped clip
is appropriately position at the anterior margin of the biopsied
calcifications. Residual calcifications extend 1.3 cm posterior to
the clip. Expected post biopsy changes present without evidence of
hematoma.
IMPRESSION: 1. Stereotactic tomosynthesis core needle biopsy of calcifications
involving the LOWER OUTER QUADRANT the LEFT breast at middle to
posterior depth. No apparent complications.
2. Appropriate positioning of the coil shaped tissue marking clip at
the anterior margin of the biopsied calcifications. Residual
calcifications extend approximately 1.3 cm posterior to the clip.

## 2024-02-06 DIAGNOSIS — Z20828 Contact with and (suspected) exposure to other viral communicable diseases: Secondary | ICD-10-CM | POA: Diagnosis not present

## 2024-02-06 DIAGNOSIS — J019 Acute sinusitis, unspecified: Secondary | ICD-10-CM | POA: Diagnosis not present

## 2024-02-06 DIAGNOSIS — Z6825 Body mass index (BMI) 25.0-25.9, adult: Secondary | ICD-10-CM | POA: Diagnosis not present

## 2024-02-24 DIAGNOSIS — K219 Gastro-esophageal reflux disease without esophagitis: Secondary | ICD-10-CM | POA: Diagnosis not present

## 2024-02-24 DIAGNOSIS — R300 Dysuria: Secondary | ICD-10-CM | POA: Diagnosis not present

## 2024-02-24 DIAGNOSIS — R5382 Chronic fatigue, unspecified: Secondary | ICD-10-CM | POA: Diagnosis not present

## 2024-02-24 DIAGNOSIS — E119 Type 2 diabetes mellitus without complications: Secondary | ICD-10-CM | POA: Diagnosis not present

## 2024-02-24 LAB — BASIC METABOLIC PANEL WITH GFR
BUN: 12 (ref 4–21)
Creatinine: 0.9 (ref 0.5–1.1)
Glucose: 290

## 2024-02-24 LAB — COMPREHENSIVE METABOLIC PANEL WITH GFR: eGFR: 71

## 2024-02-24 LAB — HEMOGLOBIN A1C: Hemoglobin A1C: 10.1

## 2024-03-09 DIAGNOSIS — Z6825 Body mass index (BMI) 25.0-25.9, adult: Secondary | ICD-10-CM | POA: Diagnosis not present

## 2024-03-09 DIAGNOSIS — N760 Acute vaginitis: Secondary | ICD-10-CM | POA: Diagnosis not present

## 2024-03-09 DIAGNOSIS — J301 Allergic rhinitis due to pollen: Secondary | ICD-10-CM | POA: Diagnosis not present

## 2024-04-16 ENCOUNTER — Other Ambulatory Visit: Payer: Self-pay | Admitting: Gastroenterology

## 2024-05-05 DIAGNOSIS — Z6826 Body mass index (BMI) 26.0-26.9, adult: Secondary | ICD-10-CM | POA: Diagnosis not present

## 2024-05-05 DIAGNOSIS — I10 Essential (primary) hypertension: Secondary | ICD-10-CM | POA: Diagnosis not present

## 2024-05-05 DIAGNOSIS — E1165 Type 2 diabetes mellitus with hyperglycemia: Secondary | ICD-10-CM | POA: Diagnosis not present

## 2024-05-15 ENCOUNTER — Telehealth: Payer: Self-pay

## 2024-05-15 NOTE — Telephone Encounter (Signed)
 Patient was identified as falling into the [place] Measure - Diabetes.   Patient was: Patient is not currently using our practice.

## 2024-05-19 ENCOUNTER — Other Ambulatory Visit: Payer: Self-pay | Admitting: Gastroenterology

## 2024-05-26 DIAGNOSIS — R5383 Other fatigue: Secondary | ICD-10-CM | POA: Diagnosis not present

## 2024-05-26 DIAGNOSIS — R300 Dysuria: Secondary | ICD-10-CM | POA: Diagnosis not present

## 2024-05-26 DIAGNOSIS — I10 Essential (primary) hypertension: Secondary | ICD-10-CM | POA: Diagnosis not present

## 2024-05-26 DIAGNOSIS — K219 Gastro-esophageal reflux disease without esophagitis: Secondary | ICD-10-CM | POA: Diagnosis not present

## 2024-05-26 DIAGNOSIS — Z6826 Body mass index (BMI) 26.0-26.9, adult: Secondary | ICD-10-CM | POA: Diagnosis not present

## 2024-05-26 DIAGNOSIS — E1165 Type 2 diabetes mellitus with hyperglycemia: Secondary | ICD-10-CM | POA: Diagnosis not present

## 2024-05-26 DIAGNOSIS — B3731 Acute candidiasis of vulva and vagina: Secondary | ICD-10-CM | POA: Diagnosis not present

## 2024-05-26 LAB — COMPREHENSIVE METABOLIC PANEL WITH GFR: eGFR: 93

## 2024-05-26 LAB — HEMOGLOBIN A1C: Hemoglobin A1C: 10.3

## 2024-05-26 LAB — BASIC METABOLIC PANEL WITH GFR
BUN: 11 (ref 4–21)
Creatinine: 0.9 (ref 0.5–1.1)
Glucose: 335

## 2024-06-21 ENCOUNTER — Other Ambulatory Visit: Payer: Self-pay | Admitting: Gastroenterology

## 2024-06-22 NOTE — Telephone Encounter (Signed)
 Phoned and LMOVM of the pt that Rx has been sent in to her pharmacy

## 2024-06-27 NOTE — Patient Instructions (Signed)

## 2024-06-28 ENCOUNTER — Ambulatory Visit (INDEPENDENT_AMBULATORY_CARE_PROVIDER_SITE_OTHER): Payer: Self-pay | Admitting: Nurse Practitioner

## 2024-06-28 ENCOUNTER — Encounter: Payer: Self-pay | Admitting: Nurse Practitioner

## 2024-06-28 VITALS — BP 128/78 | HR 88 | Ht 60.0 in | Wt 131.6 lb

## 2024-06-28 DIAGNOSIS — Z7984 Long term (current) use of oral hypoglycemic drugs: Secondary | ICD-10-CM | POA: Diagnosis not present

## 2024-06-28 DIAGNOSIS — E1165 Type 2 diabetes mellitus with hyperglycemia: Secondary | ICD-10-CM

## 2024-06-28 DIAGNOSIS — I10 Essential (primary) hypertension: Secondary | ICD-10-CM | POA: Diagnosis not present

## 2024-06-28 DIAGNOSIS — E782 Mixed hyperlipidemia: Secondary | ICD-10-CM | POA: Diagnosis not present

## 2024-06-28 MED ORDER — METFORMIN HCL ER 500 MG PO TB24: 1000.0000 mg | ORAL_TABLET | Freq: Every day | ORAL | 1 refills | Status: AC

## 2024-06-28 MED ORDER — GLIPIZIDE ER 5 MG PO TB24: 5.0000 mg | ORAL_TABLET | Freq: Every day | ORAL | 3 refills | Status: AC

## 2024-06-28 NOTE — Progress Notes (Signed)
 Endocrinology Consult Note       06/28/2024, 2:48 PM   Subjective:    Patient ID: Andrea Herrera, female    DOB: 1951-03-12.  Andrea Herrera is being seen in consultation for management of currently uncontrolled symptomatic diabetes requested by  Job Bolt, [place].   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 Received 2 DES to RCA. EF was normal.   Diabetes mellitus without complication (HCC)    GERD (gastroesophageal reflux disease)    HLD (hyperlipidemia)    Hypertension    Myocardial infarction [facility])     Past Surgical History:  Procedure Laterality Date   ABDOMINAL HYSTERECTOMY     BIOPSY  12/09/2023   Procedure: BIOPSY;  Surgeon: Shaaron Lamar HERO, MD;  [facility];  Service: Endoscopy;;   BREAST BIOPSY Right 11/2021   SCLEROTIC FIBROADENOMATOID NODULE WITH DYSTROPHIC CALCIFICATIONS- NO MALIGNANCY   BREAST BIOPSY Right 08/2017   BENIGN BREAST TISSUE WITH HYALINIZATION AND FOCAL CALCIFICATION   BREAST BIOPSY Right 07/2012   hyalinized fibroadenoma with calcifications   BREAST BIOPSY Left    benign-pt unsure when   CARDIAC CATHETERIZATION N/A 04/26/2015   Procedure: Left Heart Cath and Coronary Angiography;  Surgeon: Dorn JINNY Lesches, MD;  Location: [facility] INVASIVE CV LAB;  Service: Cardiovascular;  Laterality: N/A;   cardiac stents  2016   x 2   COLONOSCOPY WITH PROPOFOL  N/A 08/17/2023   Procedure: COLONOSCOPY WITH PROPOFOL ;  Surgeon: Shaaron Lamar HERO, MD;  [facility];  Service: Endoscopy;  Laterality: N/A;  11:00am, asa 3   COLONOSCOPY WITH PROPOFOL  N/A 12/09/2023   Procedure: COLONOSCOPY WITH PROPOFOL ;  Surgeon: Shaaron Lamar HERO, MD;  [facility];  Service: Endoscopy;  Laterality: N/A;  10:45 AM, ASA 3   ESOPHAGOGASTRODUODENOSCOPY (EGD) WITH PROPOFOL  N/A 08/17/2023    Procedure: ESOPHAGOGASTRODUODENOSCOPY (EGD) WITH PROPOFOL ;  Surgeon: Shaaron Lamar HERO, MD;  [facility];  Service: Endoscopy;  Laterality: N/A;   MALONEY DILATION N/A 08/17/2023   Procedure: AGAPITO DILATION;  Surgeon: Shaaron Lamar HERO, MD;  [facility];  Service: Endoscopy;  Laterality: N/A;   POLYPECTOMY  08/17/2023   Procedure: POLYPECTOMY;  Surgeon: Shaaron Lamar HERO, MD;  [facility];  Service: Endoscopy;;   POLYPECTOMY  12/09/2023   Procedure: POLYPECTOMY INTESTINAL;  Surgeon: Shaaron Lamar HERO, MD;  [facility];  Service: Endoscopy;;   SCLEROTHERAPY  08/17/2023   Procedure: MATIAS;  Surgeon: Shaaron Lamar HERO, MD;  [facility];  Service: Endoscopy;;   SUBMUCOSAL LIFTING INJECTION  12/09/2023   Procedure: SUBMUCOSAL LIFTING INJECTION;  Surgeon: Shaaron Lamar HERO, MD;  [facility];  Service: Endoscopy;;   SUBMUCOSAL TATTOO INJECTION  08/17/2023   Procedure: SUBMUCOSAL TATTOO INJECTION;  Surgeon: Shaaron Lamar HERO, MD;  [facility];  Service: Endoscopy;;    Social History   Socioeconomic History   Marital status: Divorced    Spouse name: Not on file   Number of children: Not on file   Years of  education: Not on file   Highest education level: Not on file  Occupational History   Not on file  Tobacco Use   Smoking status: Former    Current packs/day: 0.00    Average packs/day: 0.3 packs/day for 27.6 years (6.9 ttl pk-yrs)    Types: Cigarettes    Start date: 09/11/1987    Quit date: 04/21/2015    Years since quitting: 9.1    Passive exposure: Never   Smokeless tobacco: Never  Vaping Use   Vaping status: Never Used  Substance and Sexual Activity   Alcohol use: No    Alcohol/week: 0.0 standard drinks of alcohol   Drug use: No   Sexual activity: Not on file  Other Topics Concern   Not on file  Social History Narrative   Not on file   Social Drivers of Health   Financial Resource Strain: Not on  file  Food Insecurity: Not on file  Transportation Needs: Not on file  Physical Activity: Not on file  Stress: Not on file  Social Connections: Not on file    Family History  Problem Relation Age of Onset   Colon cancer Mother    Lung cancer Father    Stroke Father    Pancreatic cancer Brother    Heart disease Paternal Grandmother    Stroke Paternal Grandmother    Heart disease Paternal Grandfather    Stroke Paternal Grandfather     Outpatient Encounter Medications as of 06/28/2024  Medication Sig   acetaminophen  (TYLENOL ) 650 MG CR tablet Take 650 mg by mouth 2 (two) times daily as needed for pain.   amLODipine  (NORVASC ) 10 MG tablet Take 1 tablet (10 mg total) by mouth daily.   atorvastatin  (LIPITOR ) 80 MG tablet TAKE 1 TABLET BY MOUTH ONCE DAILY AT  6  PM   cholecalciferol (VITAMIN D) 1000 UNITS tablet Take 1,000 Units by mouth in the morning.   Ferrous Sulfate (IRON PO) Take 1 tablet by mouth daily.   gabapentin  (NEURONTIN ) 100 MG capsule TAKE 2 CAPSULES BY MOUTH THREE TIMES DAILY (Patient taking differently: Take 200 mg by mouth 3 (three) times daily. Patient states that she takes as needed)   lisinopril  (ZESTRIL ) 5 MG tablet Take 1 tablet (5 mg total) by mouth daily.   Magnesium 250 MG TABS Take 250 mg by mouth daily.   metoprolol  tartrate (LOPRESSOR ) 25 MG tablet Take 1 tablet by mouth twice daily   Multiple Vitamin (MULTIVITAMIN) tablet Take 1 tablet by mouth in the morning.   nitroGLYCERIN  (NITROSTAT ) 0.4 MG SL tablet Place 1 tablet (0.4 mg total) under the tongue every 5 (five) minutes x 3 doses as needed for chest pain.   pantoprazole  (PROTONIX ) 40 MG tablet Take 1 tablet by mouth once daily   POTASSIUM PO Take 99 mg by mouth every other day. In the morning.   [DISCONTINUED] glipiZIDE  (GLUCOTROL  XL) 5 MG 24 hr tablet Take 5 mg by mouth daily.   [DISCONTINUED] metFORMIN  (GLUCOPHAGE -XR) 500 MG 24 hr tablet Take 1,000 mg by mouth 2 (two) times daily with a meal.    glipiZIDE  (GLUCOTROL  XL) 5 MG 24 hr tablet Take 1 tablet (5 mg total) by mouth daily.   metFORMIN  (GLUCOPHAGE -XR) 500 MG 24 hr tablet Take 2 tablets (1,000 mg total) by mouth daily with breakfast.   [DISCONTINUED] empagliflozin (JARDIANCE) 25 MG TABS tablet Take 10 mg by mouth daily. (Patient not taking: Reported on 06/28/2024)   No facility-administered encounter medications on file as of 06/28/2024.  ALLERGIES: No Known Allergies  VACCINATION STATUS:  There is no immunization history on file for this patient.  Diabetes She presents for her initial diabetic visit. She has type 2 diabetes mellitus. Onset time: started with GD at age 89 and never got rid of it. Her disease course has been worsening. There are no hypoglycemic associated symptoms. There are no hypoglycemic complications. 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 Risk factors for coronary artery disease include diabetes mellitus, family history, hypertension, post-menopausal, sedentary lifestyle and tobacco exposure. Current diabetic treatment includes oral agent (dual therapy). She is compliant with treatment most of the time. Her weight is fluctuating minimally. She is following a generally unhealthy diet. When asked about meal planning, she reported none. She has not had a previous visit with a dietitian. She rarely participates in exercise. (She presents today for her consultation with no meter or logs to review. She admits she does not check her glucose routinely but when she does she is averaging between 150-180.  Her most recent A1c on 8/9 was 10.3%, increasing from previous visit of 10.1%.  She drinks water , occasionally a sun drop and light sweet tea (cut with water ).  She eats 2 meals per day with a snack in between.  She eats cereal for breakfast most days.  She maintains an active lifestyle.  She is UTD on eye exam, has never seen podiatry in the past.) An ACE inhibitor/angiotensin II receptor  blocker is being taken. She does not see a podiatrist.Eye exam is current.     Review of systems  Constitutional: + Minimally fluctuating body weight, current Body mass index is 25.7 kg/m., no fatigue, no subjective hyperthermia, no subjective hypothermia Eyes: no blurry vision, no xerophthalmia ENT: no sore throat, no nodules palpated in throat, no dysphagia/odynophagia, no hoarseness Cardiovascular: no chest pain, no shortness of breath, no palpitations, no leg swelling Respiratory: no cough, no shortness of breath Gastrointestinal: no nausea/vomiting/diarrhea Musculoskeletal: no muscle/joint aches Skin: no rashes, no hyperemia Neurological: no tremors, no numbness, no tingling, no dizziness Psychiatric: no depression, no anxiety  Objective:     BP 128/78 (BP Location: Left Arm, Patient Position: Sitting, Cuff Size: Large)   Pulse 88   Ht 5' (1.524 m)   Wt 131 lb 9.6 oz (59.7 kg)   BMI 25.70 kg/m   Wt Readings from Last 3 Encounters:  06/28/24 131 lb 9.6 oz (59.7 kg)  02/01/24 132 lb 3.2 oz (60 kg)  12/09/23 132 lb (59.9 kg)     BP Readings from Last 3 Encounters:  06/28/24 128/78  02/01/24 132/80  12/09/23 (!) 123/53     Physical Exam- Limited  Constitutional:  Body mass index is 25.7 kg/m. , not in acute distress, normal state of mind Eyes:  EOMI, no exophthalmos Neck: Supple Cardiovascular: RRR, no murmurs, rubs, or gallops, no edema Respiratory: Adequate breathing efforts, no crackles, rales, rhonchi, or wheezing Musculoskeletal: no gross deformities, strength intact in all four extremities, no gross restriction of joint movements Skin:  no rashes, no hyperemia Neurological: no tremor with outstretched hands   Diabetic Foot Exam - Simple   No data filed      CMP ( most recent) CMP     Component Value Date/Time   NA 138 12/09/2023 0952   K 3.9 12/09/2023 0952   CL 101 12/09/2023 0952   CO2 25 12/09/2023 0952   GLUCOSE 333 (H) 12/09/2023 0952    BUN 11 05/26/2024 0000   CREATININE 0.9  05/26/2024 0000   CREATININE 0.69 12/09/2023 0952   CALCIUM  9.9 12/09/2023 0952   PROT 7.7 04/26/2015 0357   ALBUMIN 4.2 04/26/2015 0357   AST 41 04/26/2015 0357   ALT 20 04/26/2015 0357   ALKPHOS 111 04/26/2015 0357   BILITOT 0.4 04/26/2015 0357   EGFR 93 05/26/2024 0000   GFRNONAA >60 12/09/2023 0952     Diabetic Labs (most recent): Lab Results  Component Value Date   HGBA1C 10.3 05/26/2024   HGBA1C 10.1 02/24/2024   HGBA1C 9.8 05/25/2023     Lipid Panel ( most recent) Lipid Panel     Component Value Date/Time   CHOL 99 03/23/2021 0950   TRIG 60 03/23/2021 0950   HDL 56 03/23/2021 0950   CHOLHDL 1.8 03/23/2021 0950   VLDL 12 03/23/2021 0950   LDLCALC 31 03/23/2021 0950      Lab Results  Component Value Date   TSH 0.618 04/26/2015           Assessment & Plan:   1) Type 2 diabetes mellitus with hyperglycemia, without long-term current use of insulin  (HCC) (Primary)  She presents today for her consultation with no meter or logs to review. She admits she does not check her glucose routinely but when she does she is averaging between 150-180.  Her most recent A1c on 8/9 was 10.3%, increasing from previous visit of 10.1%.  She drinks water , occasionally a sun drop and light sweet tea (cut with water ).  She eats 2 meals per day with a snack in between.  She eats cereal for breakfast most days.  She maintains an active lifestyle.  She is UTD on eye exam, has never seen podiatry in the past.  - Andrea Herrera has currently uncontrolled symptomatic type 2 DM since 73 years of age (Started as GD), with most recent A1c of 10.3 %.   -Recent labs reviewed.  - I had a long discussion with her about the progressive nature of diabetes and the pathology behind its complications. -her diabetes is complicated by CAD with MI, tobacco use and she remains at a high risk for more acute and chronic complications which include CAD, CVA, CKD,  retinopathy, and neuropathy. These are all discussed in detail with her.  The following Lifestyle Medicine recommendations according to American College of Lifestyle Medicine [facility]) were discussed and offered to patient and she agrees to start the journey:  A. Whole Foods, Plant-based plate comprising of fruits and vegetables, plant-based proteins, whole-grain carbohydrates was discussed in detail with the patient.   A list for source of those nutrients were also provided to the patient.  Patient will use only water  or unsweetened tea for hydration. B.  The need to stay away from risky substances including alcohol, smoking; obtaining 7 to 9 hours of restorative sleep, at least 150 minutes of moderate intensity exercise weekly, the importance of healthy social connections,  and stress reduction techniques were discussed. C.  A full color page of  Calorie density of various food groups per pound showing examples of each food groups was provided to the patient.  - I have counseled her on diet and weight management by adopting a carbohydrate restricted/protein rich diet. Patient is encouraged to switch to unprocessed or minimally processed complex starch and increased protein intake (animal or plant source), fruits, and vegetables. -  she is advised to stick to a routine mealtimes to eat 3 meals a day and avoid unnecessary snacks (to snack only to correct hypoglycemia).   -  she acknowledges that there is a room for improvement in her food and drink choices. - Suggestion is made for her to avoid simple carbohydrates from her diet including Cakes, Sweet Desserts, Ice Cream, Soda (diet and regular), Sweet Tea, Candies, Chips, Cookies, Store Bought Juices, Alcohol in Excess of 1-2 drinks a day, Artificial Sweeteners, Coffee Creamer, and Sugar-free Products. This will help patient to have more stable blood glucose profile and potentially avoid unintended weight gain.  - I have approached her with the following  individualized plan to manage her diabetes and patient agrees:    -she is encouraged to start/continue monitoring glucose 2 times daily, before breakfast and before bed, and to call the clinic if she has readings less than 70 or above 300 for 3 tests in a row.  - she is warned not to take insulin  without proper monitoring per orders. - Adjustment parameters are given to her for hypo and hyperglycemia in writing.  - she is advised to continue Glipizide  5 mg XL daily with breakfast and lower her Metformin  to 1000 mg ER once daily (no need for BID dosing due to ER formula), therapeutically suitable for patient .  - she is not an ideal candidate for incretin therapy given heavy smoking history which increases risk of pancreatitis.  - Specific targets for  A1c; LDL, HDL, and Triglycerides were discussed with the patient.  2) Blood Pressure /Hypertension:  her blood pressure is controlled to target.   she is advised to continue her current medications as prescribed by her PCP.  3) Lipids/Hyperlipidemia:    There is no recent lipid panel to review.  she is advised to continue Lipitor  80 mg daily at bedtime.  Side effects and precautions discussed with her.  4)  Weight/Diet:  Exercise, and detailed carbohydrates information provided  -  detailed on discharge instructions.  5) Chronic Care/Health Maintenance: -she is on ACEI/ARB and Statin medications and is encouraged to initiate and continue to follow up with Ophthalmology, Dentist, Podiatrist at least yearly or according to recommendations, and advised to stay away from smoking (quite back in 2015 after MI). I have recommended yearly flu vaccine and pneumonia vaccine at least every 5 years; moderate intensity exercise for up to 150 minutes weekly; and sleep for at least 7 hours a day.  - she is advised to maintain close follow up with Job Bolt, [place] for primary care needs, as well as her other providers for optimal and coordinated  care.   - Time spent in this patient care: 60 min, which was spent in counseling her about her diabetes and the rest reviewing her blood glucose logs, discussing her hypoglycemia and hyperglycemia episodes, reviewing her current and previous labs/studies (including abstraction from other facilities) and medications doses and developing a long term treatment plan based on the latest standards of care/guidelines; and documenting her care.    Please refer to Patient Instructions for Blood Glucose Monitoring and Insulin /Medications Dosing Guide in media tab for additional information. Please also refer to Patient Self Inventory in the Media tab for reviewed elements of pertinent patient history.  Andrea Herrera participated in the discussions, expressed understanding, and voiced agreement with the above plans.  All questions were answered to her satisfaction. she is encouraged to contact clinic should she have any questions or concerns prior to her return visit.     Follow up plan: - Return in about 3 months (around 09/27/2024) for Diabetes F/U with A1c in office, No previsit labs, Bring  meter and logs.    Benton Rio, [facility] [address] [place], [place] [address] Phone: [phone] Fax: [phone]  06/28/2024, 2:48 PM

## 2024-07-19 NOTE — Progress Notes (Unsigned)
 Andrea Herrera                                          MRN: [id number]   07/19/2024   The [facility] Quality Team Specialist reviewed this patient medical record for the purposes of chart review for care gap closure. The following were reviewed: chart review for care gap closure-{CHL AMB [facility] QUALITY SPECIALIST CARE HJED:[id number]}.    [facility]BCI Quality Team

## 2024-07-23 ENCOUNTER — Other Ambulatory Visit: Payer: Self-pay | Admitting: Gastroenterology

## 2024-08-02 ENCOUNTER — Other Ambulatory Visit: Payer: Self-pay | Admitting: Cardiology

## 2024-08-09 ENCOUNTER — Other Ambulatory Visit: Payer: Self-pay | Admitting: Cardiology

## 2024-08-12 ENCOUNTER — Telehealth: Payer: Self-pay | Admitting: Physician Assistant

## 2024-08-12 MED ORDER — METOPROLOL TARTRATE 25 MG PO TABS: 25.0000 mg | ORAL_TABLET | Freq: Two times a day (BID) | ORAL | 0 refills | Status: DC

## 2024-08-12 NOTE — Telephone Encounter (Signed)
 Patient called to request refill of Lopressor  25 mg BID for #180 with 1 refill. Discussed the weekend provider are only able to provide short term supply. Will send 30 day supply for Lopressor . Will route to [facility] triage to send prescription for long term with 90 day supply and refills.

## 2024-08-13 ENCOUNTER — Other Ambulatory Visit (HOSPITAL_BASED_OUTPATIENT_CLINIC_OR_DEPARTMENT_OTHER): Payer: Self-pay

## 2024-08-13 ENCOUNTER — Other Ambulatory Visit: Payer: Self-pay

## 2024-08-13 MED ORDER — METOPROLOL TARTRATE 25 MG PO TABS: 25.0000 mg | ORAL_TABLET | Freq: Two times a day (BID) | ORAL | 1 refills | Status: AC

## 2024-08-13 MED ORDER — METOPROLOL TARTRATE 25 MG PO TABS
25.0000 mg | ORAL_TABLET | Freq: Two times a day (BID) | ORAL | 0 refills | Status: DC
  Filled 2024-08-13: qty 180, 90d supply, fill #0

## 2024-08-13 NOTE — Addendum Note (Signed)
 Addended by: Jennett Tarbell M on: 08/13/2024 10:26 AM   Modules accepted: Orders

## 2024-08-13 NOTE — Telephone Encounter (Signed)
 Metoprolol  refill sent to [facility].  Need to schedule 6 month f/u visit with Branch.

## 2024-08-25 ENCOUNTER — Other Ambulatory Visit: Payer: Self-pay | Admitting: Gastroenterology

## 2024-09-17 ENCOUNTER — Other Ambulatory Visit: Payer: Self-pay | Admitting: Cardiology

## 2024-09-26 ENCOUNTER — Other Ambulatory Visit: Payer: Self-pay | Admitting: Orthopedic Surgery

## 2024-09-26 ENCOUNTER — Other Ambulatory Visit: Payer: Self-pay | Admitting: Gastroenterology

## 2024-09-27 ENCOUNTER — Encounter: Payer: Self-pay | Admitting: Nurse Practitioner

## 2024-09-27 ENCOUNTER — Ambulatory Visit: Admitting: Nurse Practitioner

## 2024-09-27 VITALS — BP 128/60 | HR 72 | Ht 60.0 in | Wt 130.2 lb

## 2024-09-27 DIAGNOSIS — Z7984 Long term (current) use of oral hypoglycemic drugs: Secondary | ICD-10-CM | POA: Diagnosis not present

## 2024-09-27 DIAGNOSIS — E782 Mixed hyperlipidemia: Secondary | ICD-10-CM | POA: Diagnosis not present

## 2024-09-27 DIAGNOSIS — I10 Essential (primary) hypertension: Secondary | ICD-10-CM | POA: Diagnosis not present

## 2024-09-27 DIAGNOSIS — E1165 Type 2 diabetes mellitus with hyperglycemia: Secondary | ICD-10-CM

## 2024-09-27 LAB — POCT GLYCOSYLATED HEMOGLOBIN (HGB A1C): Hemoglobin A1C: 11.9 % — AB (ref 4.0–5.6)

## 2024-09-27 LAB — POCT UA - MICROALBUMIN

## 2024-09-27 MED ORDER — PEN NEEDLES 32G X 4 MM MISC: 3 refills | Status: AC

## 2024-09-27 MED ORDER — INSULIN GLARGINE 100 UNIT/ML SOLOSTAR PEN: 15.0000 [IU] | PEN_INJECTOR | Freq: Every day | SUBCUTANEOUS | 3 refills | Status: AC

## 2024-09-27 NOTE — Patient Instructions (Signed)
 Insulin  Injection Instructions, Using Insulin  Pens, Adult There are many different types of insulin . The type of insulin  that you take may determine how many injections you give yourself and when you need to give the injections. Supplies needed: Soap and water . Your insulin  pen. A new needle. Alcohol wipes. A disposal container for sharp items (sharps container), such as an empty plastic bottle with a cover. How to choose a site for injection The body absorbs insulin  differently, depending on where the insulin  is injected (injection site). It is best to inject insulin  into the same body area each time; for example, always in the abdomen. However, you should use a different spot in that area for each injection. Do not inject the insulin  in the same spot each time. There are five main areas that can be used for injecting. These areas are: Abdomen. This is the preferred area. Front of thigh. Upper, outer side of thigh. Upper, outer side of arm. Upper, outer part of buttock. How to use an insulin  pen  Get ready Wash your hands with soap and water  for at least 20 seconds. If soap and water  are not available, use hand sanitizer. Test your blood sugar (glucose) level and write down that number. Follow any instructions from your health care provider about what to do if your blood glucose level is higher or lower than your normal range. Make sure the insulin  pen has the right kind of insulin  and there is enough insulin  in the pen for the dose. Check the expiration date. If you are using CLEAR insulin , check to see that it is clear and free of clumps. If you are using CLOUDY insulin , mix it by gently rolling the insulin  pen between your palms several times. Do not shake the pen. Remove the cap from the insulin  pen. Use an alcohol wipe to clean the rubber tip of the pen. Remove the protective paper tab from the disposable needle. Do not let the needle touch anything. Screw a new, unused needle onto  the pen. Remove the outer plastic needle cover. Do not throw away the outer plastic cover yet. If the pen uses a special safety needle, leave the inner needle shield in place. If the pen does not use a special safety needle, remove the inner plastic cover from the needle. If you skip this step, you may not get the right amount of insulin . Follow the manufacturer's instructions to prime the insulin  pen with the volume of insulin  needed. Hold the pen with the needle pointing up, and push the button on the opposite end of the pen until a drop of insulin  appears at the needle tip. If no insulin  appears, repeat this step. Turn the button (dial) to the number of units of insulin  that you will be injecting. Inject the insulin  Use an alcohol wipe to clean the site where you will be inserting the needle. Let the site air-dry. Grip the base of the pen with a loose fist and rest your thumb on the pen or hold the pen in the palm of your writing hand like a pencil. If directed by your health care provider, use your other hand to pinch and hold about 1 inch (2.5 cm) of skin at the injection site. Do not directly touch the cleaned part of the skin. Gently but quickly, use your writing hand to put the needle straight into the skin. Insert the needle at a 45-degree angle or a 90-degree angle (perpendicular) to the skin, as directed by your health  care provider. When the needle is completely inserted into the skin, let go of the skin that you are pinching. Use your thumb or index finger of your writing hand to push the top button of the pen all the way to inject the insulin . Continue to hold the pen in place with your writing hand. Wait 10 seconds, then pull the needle straight out of the skin. This will allow all of the insulin  to go from the pen and needle into your body. Carefully put the outer plastic cover of the needle back over the needle, then unscrew the capped needle and discard it in a sharps container, such  as an empty plastic bottle with a cover. Put the plastic cap back on the insulin  pen. How to throw away supplies Discard all used needles in a sharps container. Follow the disposal regulations for the area where you live. Do not use any needle more than one time. Throw away empty disposable pens in the regular trash. Questions to ask your health care provider How often should I be taking insulin ? How often should I check my blood glucose? What amount of insulin  should I be taking each time? What are the side effects? What should I do if my blood glucose is too high? What should I do if my blood glucose is too low? What should I do if I forget to take my insulin ? What number should I call if I have questions? Where to find more information American Diabetes Association (ADA): www.diabetes.org Association of Diabetes Care and Education Specialists (ADCES): www.diabeteseducator.org Summary Before you give yourself an insulin  injection, be sure to wash your hands for at least 20 seconds and test your blood glucose level. Write down that number. Check the expiration date and the type of insulin  that is in the pen. The type of insulin  that you take may determine how many injections you give yourself and when you need to give the injections. It is best to inject insulin  into the same body area each time; for example, always in the abdomen. However, you should use a different spot in that area for each injection. Do not use a needle more than one time. This information is not intended to replace advice given to you by your health care provider. Make sure you discuss any questions you have with your health care provider. Document Revised: 12/22/2020 Document Reviewed: 08/02/2020 Elsevier Patient Education  2024 [employer].

## 2024-09-27 NOTE — Progress Notes (Signed)
 Endocrinology Consult Note       09/27/2024, 3:58 PM   Subjective:    Patient ID: Andrea Herrera, female    DOB: May 13, 1951.  Andrea Herrera is being seen in consultation for management of currently uncontrolled symptomatic diabetes requested by  Job Bolt, [place].   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 Received 2 DES to RCA. EF was normal.   Diabetes mellitus without complication (HCC)    GERD (gastroesophageal reflux disease)    HLD (hyperlipidemia)    Hypertension    Myocardial infarction [facility])     Past Surgical History:  Procedure Laterality Date   ABDOMINAL HYSTERECTOMY     BIOPSY  12/09/2023   Procedure: BIOPSY;  Surgeon: Shaaron Lamar HERO, MD;  [facility];  Service: Endoscopy;;   BREAST BIOPSY Right 11/2021   SCLEROTIC FIBROADENOMATOID NODULE WITH DYSTROPHIC CALCIFICATIONS- NO MALIGNANCY   BREAST BIOPSY Right 08/2017   BENIGN BREAST TISSUE WITH HYALINIZATION AND FOCAL CALCIFICATION   BREAST BIOPSY Right 07/2012   hyalinized fibroadenoma with calcifications   BREAST BIOPSY Left    benign-pt unsure when   CARDIAC CATHETERIZATION N/A 04/26/2015   Procedure: Left Heart Cath and Coronary Angiography;  Surgeon: Dorn JINNY Lesches, MD;  Location: [facility] INVASIVE CV LAB;  Service: Cardiovascular;  Laterality: N/A;   cardiac stents  2016   x 2   COLONOSCOPY WITH PROPOFOL  N/A 08/17/2023   Procedure: COLONOSCOPY WITH PROPOFOL ;  Surgeon: Shaaron Lamar HERO, MD;  [facility];  Service: Endoscopy;  Laterality: N/A;  11:00am, asa 3   COLONOSCOPY WITH PROPOFOL  N/A 12/09/2023   Procedure: COLONOSCOPY WITH PROPOFOL ;  Surgeon: Shaaron Lamar HERO, MD;  [facility];  Service: Endoscopy;  Laterality: N/A;  10:45 AM, ASA 3   ESOPHAGOGASTRODUODENOSCOPY (EGD) WITH PROPOFOL  N/A 08/17/2023    Procedure: ESOPHAGOGASTRODUODENOSCOPY (EGD) WITH PROPOFOL ;  Surgeon: Shaaron Lamar HERO, MD;  [facility];  Service: Endoscopy;  Laterality: N/A;   MALONEY DILATION N/A 08/17/2023   Procedure: AGAPITO DILATION;  Surgeon: Shaaron Lamar HERO, MD;  [facility];  Service: Endoscopy;  Laterality: N/A;   POLYPECTOMY  08/17/2023   Procedure: POLYPECTOMY;  Surgeon: Shaaron Lamar HERO, MD;  [facility];  Service: Endoscopy;;   POLYPECTOMY  12/09/2023   Procedure: POLYPECTOMY INTESTINAL;  Surgeon: Shaaron Lamar HERO, MD;  [facility];  Service: Endoscopy;;   SCLEROTHERAPY  08/17/2023   Procedure: MATIAS;  Surgeon: Shaaron Lamar HERO, MD;  [facility];  Service: Endoscopy;;   SUBMUCOSAL LIFTING INJECTION  12/09/2023   Procedure: SUBMUCOSAL LIFTING INJECTION;  Surgeon: Shaaron Lamar HERO, MD;  [facility];  Service: Endoscopy;;   SUBMUCOSAL TATTOO INJECTION  08/17/2023   Procedure: SUBMUCOSAL TATTOO INJECTION;  Surgeon: Shaaron Lamar HERO, MD;  [facility];  Service: Endoscopy;;    Social History   Socioeconomic History   Marital status: Divorced    Spouse name: Not on file   Number of children: Not on file   Years of  education: Not on file   Highest education level: Not on file  Occupational History   Not on file  Tobacco Use   Smoking status: Former    Current packs/day: 0.00    Average packs/day: 0.3 packs/day for 27.6 years (6.9 ttl pk-yrs)    Types: Cigarettes    Start date: 09/11/1987    Quit date: 04/21/2015    Years since quitting: 9.4    Passive exposure: Never   Smokeless tobacco: Never  Vaping Use   Vaping status: Never Used  Substance and Sexual Activity   Alcohol use: No    Alcohol/week: 0.0 standard drinks of alcohol   Drug use: No   Sexual activity: Not on file  Other Topics Concern   Not on file  Social History Narrative   Not on file   Social Drivers of Health   Tobacco Use: Medium Risk  (09/27/2024)   Patient History    Smoking Tobacco Use: Former    Smokeless Tobacco Use: Never    Passive Exposure: Never  Programmer, 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 on Ship [employer] Insecurity: Not on file  Transportation Needs: Not on file  Physical Activity: Not on file  Stress: Not on file  Social Connections: Not on file  Depression (PHQ2-9): Not on file  Alcohol Screen: Not on file  Housing: Not on file  Utilities: Not on file  Health Literacy: Not on file    Family History  Problem Relation Age of Onset   Colon cancer Mother    Lung cancer Father    Stroke Father    Pancreatic cancer Brother    Heart disease Paternal Grandmother    Stroke Paternal Grandmother    Heart disease Paternal Grandfather    Stroke Paternal Grandfather     Outpatient Encounter Medications as of 09/27/2024  Medication Sig   acetaminophen  (TYLENOL ) 650 MG CR tablet Take 650 mg by mouth 2 (two) times daily as needed for pain.   amLODipine  (NORVASC ) 10 MG tablet Take 1 tablet (10 mg total) by mouth daily.   atorvastatin  (LIPITOR ) 80 MG tablet TAKE 1 TABLET BY MOUTH ONCE DAILY AT  6  PM   cholecalciferol (VITAMIN D) 1000 UNITS tablet Take 1,000 Units by mouth in the morning.   Ferrous Sulfate (IRON PO) Take 1 tablet by mouth daily.   gabapentin  (NEURONTIN ) 100 MG capsule Take 2 capsules (200 mg total) by mouth 3 (three) times daily. Patient states that she takes as needed   glipiZIDE  (GLUCOTROL  XL) 5 MG 24 hr tablet Take 1 tablet (5 mg total) by mouth daily.   insulin  glargine (LANTUS) 100 UNIT/ML Solostar Pen Inject 15 Units into the skin at bedtime.   Insulin  Pen Needle (PEN NEEDLES) 32G X 4 MM MISC Use to inject insulin  once daily at bedtime   lisinopril  (ZESTRIL ) 5 MG tablet Take 1 tablet by mouth once daily   Magnesium 250 MG TABS Take 250 mg by mouth daily.   metFORMIN  (GLUCOPHAGE -XR) 500 MG 24 hr tablet Take 2 tablets (1,000 mg total) by mouth daily with breakfast.   metoprolol  tartrate (LOPRESSOR ) 25 MG  tablet Take 1 tablet (25 mg total) by mouth 2 (two) times daily.   Multiple Vitamin (MULTIVITAMIN) tablet Take 1 tablet by mouth in the morning.   nitroGLYCERIN  (NITROSTAT ) 0.4 MG SL tablet Place 1 tablet (0.4 mg total) under the tongue every 5 (five) minutes x 3 doses as needed for chest pain.   pantoprazole  (PROTONIX ) 40 MG tablet Take 1 tablet  by mouth once daily   POTASSIUM PO Take 99 mg by mouth every other day. In the morning.   [DISCONTINUED] gabapentin  (NEURONTIN ) 100 MG capsule TAKE 2 CAPSULES BY MOUTH THREE TIMES DAILY (Patient taking differently: Take 200 mg by mouth 3 (three) times daily. Patient states that she takes as needed)   [DISCONTINUED] pantoprazole  (PROTONIX ) 40 MG tablet Take 1 tablet by mouth once daily   No facility-administered encounter medications on file as of 09/27/2024.    ALLERGIES: No Known Allergies  VACCINATION STATUS:  There is no immunization history on file for this patient.  Diabetes She presents for her follow-up diabetic visit. She has type 2 diabetes mellitus. Onset time: started with GD at age 24 and never got rid of it. Her disease course has been worsening. There are no hypoglycemic associated symptoms. Associated symptoms include fatigue, polydipsia, polyuria and weight loss. There are no hypoglycemic complications. 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 Risk factors for coronary artery disease include diabetes mellitus, family history, hypertension, post-menopausal, sedentary lifestyle and tobacco exposure. Current diabetic treatment includes oral agent (dual therapy). She is compliant with treatment most of the time. Her weight is fluctuating minimally. She is following a generally unhealthy diet. When asked about meal planning, she reported none. She has not had a previous visit with a dietitian. She rarely participates in exercise. Her home blood glucose trend is increasing steadily. Her overall blood glucose  range is >200 mg/dl. (She presents today with her meter showing inconsistent glucose monitoring and gross hyperglycemia overall.  Her POCT A1c today is 11.9%, increasing from last visit of 10.3%.  She notes she has been a bit depressed, has recently had yeast infection as well.  She has glucose ranging between 140-562 in her meter and she does have polyuria and polydipsia.) An ACE inhibitor/angiotensin II receptor blocker is being taken. She does not see a podiatrist.Eye exam is current.     Review of systems  Constitutional: + decreasing body weight, current Body mass index is 25.43 kg/m., no fatigue, no subjective hyperthermia, no subjective hypothermia, on meds for yeast infection Eyes: no blurry vision, no xerophthalmia ENT: no sore throat, no nodules palpated in throat, no dysphagia/odynophagia, no hoarseness Cardiovascular: no chest pain, no shortness of breath, no palpitations, no leg swelling Respiratory: no cough, no shortness of breath Gastrointestinal: no nausea/vomiting/diarrhea Musculoskeletal: no muscle/joint aches Skin: no rashes, no hyperemia Neurological: no tremors, no numbness, no tingling, no dizziness Psychiatric: no depression, no anxiety  Objective:     BP 128/60 (BP Location: Left Arm, Patient Position: Sitting, Cuff Size: Large)   Pulse 72   Ht 5' (1.524 m)   Wt 130 lb 3.2 oz (59.1 kg)   BMI 25.43 kg/m   Wt Readings from Last 3 Encounters:  09/27/24 130 lb 3.2 oz (59.1 kg)  06/28/24 131 lb 9.6 oz (59.7 kg)  02/01/24 132 lb 3.2 oz (60 kg)     BP Readings from Last 3 Encounters:  09/27/24 128/60  06/28/24 128/78  02/01/24 132/80     Physical Exam- Limited  Constitutional:  Body mass index is 25.43 kg/m. , not in acute distress, tearful today when we discussed needs for insulin  Eyes:  EOMI, no exophthalmos Musculoskeletal: no gross deformities, strength intact in all four extremities, no gross restriction of joint movements Skin:  no rashes, no  hyperemia Neurological: no tremor with outstretched hands   Diabetic Foot Exam - Simple   Simple Foot Form Diabetic Foot exam  was performed with the following findings: Yes 09/27/2024  3:33 PM  Visual Inspection No deformities, no ulcerations, no other skin breakdown bilaterally: Yes Sensation Testing Intact to touch and monofilament testing bilaterally: Yes Pulse Check Posterior Tibialis and Dorsalis pulse intact bilaterally: Yes Comments      CMP ( most recent) CMP     Component Value Date/Time   NA 138 12/09/2023 0952   K 3.9 12/09/2023 0952   CL 101 12/09/2023 0952   CO2 25 12/09/2023 0952   GLUCOSE 333 (H) 12/09/2023 0952   BUN 11 05/26/2024 0000   CREATININE 0.9 05/26/2024 0000   CREATININE 0.69 12/09/2023 0952   CALCIUM  9.9 12/09/2023 0952   PROT 7.7 04/26/2015 0357   ALBUMIN 4.2 04/26/2015 0357   AST 41 04/26/2015 0357   ALT 20 04/26/2015 0357   ALKPHOS 111 04/26/2015 0357   BILITOT 0.4 04/26/2015 0357   EGFR 93 05/26/2024 0000   GFRNONAA >60 12/09/2023 0952     Diabetic Labs (most recent): Lab Results  Component Value Date   HGBA1C 11.9 (A) 09/27/2024   HGBA1C 10.3 05/26/2024   HGBA1C 10.1 02/24/2024   MICROALBUR 30mg /L 09/27/2024     Lipid Panel ( most recent) Lipid Panel     Component Value Date/Time   CHOL 99 03/23/2021 0950   TRIG 60 03/23/2021 0950   HDL 56 03/23/2021 0950   CHOLHDL 1.8 03/23/2021 0950   VLDL 12 03/23/2021 0950   LDLCALC 31 03/23/2021 0950      Lab Results  Component Value Date   TSH 0.618 04/26/2015           Assessment & Plan:   1) Type 2 diabetes mellitus with hyperglycemia, without long-term current use of insulin  (HCC) (Primary)  She presents today with her meter showing inconsistent glucose monitoring and gross hyperglycemia overall.  Her POCT A1c today is 11.9%, increasing from last visit of 10.3%.  She notes she has been a bit depressed, has recently had yeast infection as well.  She has glucose  ranging between 140-562 in her meter and she does have polyuria and polydipsia.  - Andrea Herrera has currently uncontrolled symptomatic type 2 DM since 73 years of age..   -Recent labs reviewed.  Her POCT UM today shows mild microalbuminuria.  Will check CMP on subsequent visits.  - I had a long discussion with her about the progressive nature of diabetes and the pathology behind its complications. -her diabetes is complicated by CAD with MI, tobacco use and she remains at a high risk for more acute and chronic complications which include CAD, CVA, CKD, retinopathy, and neuropathy. These are all discussed in detail with her.  The following Lifestyle Medicine recommendations according to American College of Lifestyle Medicine [facility]) were discussed and offered to patient and she agrees to start the journey:  A. Whole Foods, Plant-based plate comprising of fruits and vegetables, plant-based proteins, whole-grain carbohydrates was discussed in detail with the patient.   A list for source of those nutrients were also provided to the patient.  Patient will use only water  or unsweetened tea for hydration. B.  The need to stay away from risky substances including alcohol, smoking; obtaining 7 to 9 hours of restorative sleep, at least 150 minutes of moderate intensity exercise weekly, the importance of healthy social connections,  and stress reduction techniques were discussed. C.  A full color page of  Calorie density of various food groups per pound showing examples of each food groups was  provided to the patient.  - Nutritional counseling repeated/built upon at each appointment.  - The patient admits there is a room for improvement in their diet and drink choices. -  Suggestion is made for the patient to avoid simple carbohydrates from their diet including Cakes, Sweet Desserts / Pastries, Ice Cream, Soda (diet and regular), Sweet Tea, Candies, Chips, Cookies, Sweet Pastries, Store Bought Juices, Alcohol  in Excess of 1-2 drinks a day, Artificial Sweeteners, Coffee Creamer, and Sugar-free Products. This will help patient to have stable blood glucose profile and potentially avoid unintended weight gain.   - I encouraged the patient to switch to unprocessed or minimally processed complex starch and increased protein intake (animal or plant source), fruits, and vegetables.   - Patient is advised to stick to a routine mealtimes to eat 3 meals a day and avoid unnecessary snacks (to snack only to correct hypoglycemia).  - I have approached her with the following individualized plan to manage her diabetes and patient agrees:   -Due to worsening glycemic profile, will initiate basal insulin  with Lantus 15 units SQ nightly.  We did go over proper injection technique today in the room.  She will also continue her Glipizide  5 mg XL daily at breakfast and Metformin  1000 mg ER once daily as well.  I am bringing her back in 1 month to go over readings and adjust insulin  dose if needed.  -she is encouraged to start/continue monitoring glucose 2 times daily, before breakfast and before bed, and to call the clinic if she has readings less than 70 or above 300 for 3 tests in a row.  - she is warned not to take insulin  without proper monitoring per orders. - Adjustment parameters are given to her for hypo and hyperglycemia in writing.  - she is not an ideal candidate for incretin therapy given heavy smoking history which increases risk of pancreatitis.  - Specific targets for  A1c; LDL, HDL, and Triglycerides were discussed with the patient.  2) Blood Pressure /Hypertension:  her blood pressure is controlled to target.   she is advised to continue her current medications as prescribed by her PCP.  3) Lipids/Hyperlipidemia:    There is no recent lipid panel to review.  she is advised to continue Lipitor  80 mg daily at bedtime.  Side effects and precautions discussed with her.  4)  Weight/Diet:  Exercise, and  detailed carbohydrates information provided  -  detailed on discharge instructions.  5) Chronic Care/Health Maintenance: -she is on ACEI/ARB and Statin medications and is encouraged to initiate and continue to follow up with Ophthalmology, Dentist, Podiatrist at least yearly or according to recommendations, and advised to stay away from smoking (quite back in 2015 after MI). I have recommended yearly flu vaccine and pneumonia vaccine at least every 5 years; moderate intensity exercise for up to 150 minutes weekly; and sleep for at least 7 hours a day.  - she is advised to maintain close follow up with Job Bolt, [place] for primary care needs, as well as her other providers for optimal and coordinated care.    I spent  51  minutes in the care of the patient today including review of labs from CMP, Lipids, Thyroid  Function, Hematology (current and previous including abstractions from other facilities); face-to-face time discussing  her blood glucose readings/logs, discussing hypoglycemia and hyperglycemia episodes and symptoms, medications doses, her options of short and long term treatment based on the latest standards of care / guidelines;  discussion  about incorporating lifestyle medicine;  and documenting the encounter. Risk reduction counseling performed per USPSTF guidelines to reduce obesity and cardiovascular risk factors.     Please refer to Patient Instructions for Blood Glucose Monitoring and Insulin /Medications Dosing Guide  in media tab for additional information. Please  also refer to  Patient Self Inventory in the Media  tab for reviewed elements of pertinent patient history.  Andrea Herrera participated in the discussions, expressed understanding, and voiced agreement with the above plans.  All questions were answered to her satisfaction. she is encouraged to contact clinic should she have any questions or concerns prior to her return visit.     Follow up plan: - Return in about  4 weeks (around 10/25/2024) for Diabetes F/U, Bring meter and logs.    Benton Rio, [facility] [address] [place], [place] [address] Phone: [phone] Fax: [phone]  09/27/2024, 3:58 PM

## 2024-10-09 ENCOUNTER — Encounter: Payer: Self-pay | Admitting: Cardiology

## 2024-10-09 ENCOUNTER — Ambulatory Visit: Attending: Cardiology | Admitting: Cardiology

## 2024-10-09 ENCOUNTER — Other Ambulatory Visit: Payer: Self-pay | Admitting: Cardiology

## 2024-10-09 VITALS — BP 130/50 | HR 83 | Ht 60.0 in | Wt 132.4 lb

## 2024-10-09 DIAGNOSIS — I10 Essential (primary) hypertension: Secondary | ICD-10-CM | POA: Diagnosis not present

## 2024-10-09 DIAGNOSIS — I2510 Atherosclerotic heart disease of native coronary artery without angina pectoris: Secondary | ICD-10-CM

## 2024-10-09 DIAGNOSIS — E782 Mixed hyperlipidemia: Secondary | ICD-10-CM

## 2024-10-09 DIAGNOSIS — Z79899 Other long term (current) drug therapy: Secondary | ICD-10-CM | POA: Diagnosis not present

## 2024-10-09 NOTE — Addendum Note (Signed)
 Addended by: Caralina Nop G on: 10/09/2024 03:56 PM   Modules accepted: Orders

## 2024-10-09 NOTE — Patient Instructions (Addendum)
 Medication Instructions:   Continue all current medications.   Labwork:  FLP - order given today Reminder:  Nothing to eat or drink after 12 midnight prior to labs. Office will contact with results via phone, letter or mychart.     Testing/Procedures:  none  Follow-Up:  6 months   Any Other Special Instructions Will Be Listed Below (If Applicable).   If you need a refill on your cardiac medications before your next appointment, please call your pharmacy.

## 2024-10-09 NOTE — Progress Notes (Signed)
 "     Clinical Summary Andrea Herrera is a 73 y.o.female seen today for follow up of the following medical problems.    1. CAD - admit 04/2015 with inferior STEMI, s/p DES x 2 to RCA. LVEF 55-60% by echo - 06/2020 echo LVEF 60-65%, indet DDx, normal RV function  04/2022 nuclear stress: no ischemia. - has had some chronic SOB, of note prior PFTs were benign in 2016   - no recent chest pains. No SOB/DOE - compliant with meds    2. HTN - compliant with meds      3. Hyperlipidemia - she is on atorvastatin  80mg  daily   03/2021 TC 99 TG 60 HDL 56 LDL 31 08/2021 TC 84 TG 77 HDL 46 LDL 22 - reports more recent labs with pcp   4. 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 Received 2 DES to RCA. EF was normal.   Diabetes mellitus without complication (HCC)    GERD (gastroesophageal reflux disease)    HLD (hyperlipidemia)    Hypertension    Myocardial infarction (HCC)      Allergies[1]   Current Outpatient Medications  Medication Sig Dispense Refill   acetaminophen  (TYLENOL ) 650 MG CR tablet Take 650 mg by mouth 2 (two) times daily as needed for pain.     amLODipine  (NORVASC ) 10 MG tablet Take 1 tablet (10 mg total) by mouth daily. 90 tablet 1   atorvastatin  (LIPITOR ) 80 MG tablet TAKE 1 TABLET BY MOUTH ONCE DAILY AT  6  PM 90 tablet 3   cholecalciferol (VITAMIN D) 1000 UNITS tablet Take 1,000 Units by mouth in the morning.     Ferrous Sulfate (IRON PO) Take 1 tablet by mouth daily.     gabapentin  (NEURONTIN ) 100 MG capsule Take 2 capsules (200 mg total) by mouth 3 (three) times daily. Patient states that she takes as needed 60 capsule 2   glipiZIDE  (GLUCOTROL  XL) 5 MG 24 hr tablet Take 1 tablet (5 mg total) by mouth daily. 90 tablet 3   insulin  glargine (LANTUS ) 100 UNIT/ML Solostar Pen Inject 15 Units into the skin at bedtime.  15 mL 3   Insulin  Pen Needle (PEN NEEDLES) 32G X 4 MM MISC Use to inject insulin  once daily at bedtime 100 each 3   lisinopril  (ZESTRIL ) 5 MG tablet Take 1 tablet by mouth once daily 90 tablet 1   Magnesium 250 MG TABS Take 250 mg by mouth daily.     metFORMIN  (GLUCOPHAGE -XR) 500 MG 24 hr tablet Take 2 tablets (1,000 mg total) by mouth daily with breakfast. 180 tablet 1   metoprolol  tartrate (LOPRESSOR ) 25 MG tablet Take 1 tablet (25 mg total) by mouth 2 (two) times daily. 180 tablet 1   Multiple Vitamin (MULTIVITAMIN) tablet Take 1 tablet by mouth in the morning.     nitroGLYCERIN  (NITROSTAT ) 0.4 MG SL tablet Place 1 tablet (0.4 mg total) under the tongue every 5 (five) minutes x 3 doses as needed for chest pain. 25 tablet 3   pantoprazole  (PROTONIX ) 40 MG tablet Take 1 tablet by mouth once daily 30 tablet 4   POTASSIUM PO Take 99 mg by mouth every other day. In the morning.     No current facility-administered medications for this visit.  Past Surgical History:  Procedure Laterality Date   ABDOMINAL HYSTERECTOMY     BIOPSY  12/09/2023   Procedure: BIOPSY;  Surgeon: Shaaron Lamar HERO, MD;  Location: [facility];  Service: Endoscopy;;   BREAST BIOPSY Right 11/2021   SCLEROTIC FIBROADENOMATOID NODULE WITH DYSTROPHIC CALCIFICATIONS- NO MALIGNANCY   BREAST BIOPSY Right 08/2017   BENIGN BREAST TISSUE WITH HYALINIZATION AND FOCAL CALCIFICATION   BREAST BIOPSY Right 07/2012   hyalinized fibroadenoma with calcifications   BREAST BIOPSY Left    benign-pt unsure when   CARDIAC CATHETERIZATION N/A 04/26/2015   Procedure: Left Heart Cath and Coronary Angiography;  Surgeon: Dorn JINNY Lesches, MD;  Location: [facility];  Service: Cardiovascular;  Laterality: N/A;   cardiac stents  2016   x 2   COLONOSCOPY WITH PROPOFOL  N/A 08/17/2023   Procedure: COLONOSCOPY WITH PROPOFOL ;  Surgeon: Shaaron Lamar HERO, MD;  Location: [facility];  Service: Endoscopy;  Laterality: N/A;  11:00am, asa 3    COLONOSCOPY WITH PROPOFOL  N/A 12/09/2023   Procedure: COLONOSCOPY WITH PROPOFOL ;  Surgeon: Shaaron Lamar HERO, MD;  Location: [facility];  Service: Endoscopy;  Laterality: N/A;  10:45 AM, ASA 3   ESOPHAGOGASTRODUODENOSCOPY (EGD) WITH PROPOFOL  N/A 08/17/2023   Procedure: ESOPHAGOGASTRODUODENOSCOPY (EGD) WITH PROPOFOL ;  Surgeon: Shaaron Lamar HERO, MD;  Location: [facility];  Service: Endoscopy;  Laterality: N/A;   MALONEY DILATION N/A 08/17/2023   Procedure: AGAPITO DILATION;  Surgeon: Shaaron Lamar HERO, MD;  Location: [facility];  Service: Endoscopy;  Laterality: N/A;   POLYPECTOMY  08/17/2023   Procedure: POLYPECTOMY;  Surgeon: Shaaron Lamar HERO, MD;  Location: [facility];  Service: Endoscopy;;   POLYPECTOMY  12/09/2023   Procedure: POLYPECTOMY INTESTINAL;  Surgeon: Shaaron Lamar HERO, MD;  Location: [facility];  Service: Endoscopy;;   SCLEROTHERAPY  08/17/2023   Procedure: MATIAS;  Surgeon: Shaaron Lamar HERO, MD;  Location: [facility];  Service: Endoscopy;;   SUBMUCOSAL LIFTING INJECTION  12/09/2023   Procedure: SUBMUCOSAL LIFTING INJECTION;  Surgeon: Shaaron Lamar HERO, MD;  Location: [facility];  Service: Endoscopy;;   SUBMUCOSAL TATTOO INJECTION  08/17/2023   Procedure: SUBMUCOSAL TATTOO INJECTION;  Surgeon: Shaaron Lamar HERO, MD;  Location: [facility];  Service: Endoscopy;;     Allergies[2]    Family History  Problem Relation Age of Onset   Colon cancer Mother    Lung cancer Father    Stroke Father    Pancreatic cancer Brother    Heart disease Paternal Grandmother    Stroke Paternal Grandmother    Heart disease Paternal Grandfather    Stroke Paternal Grandfather      Social History Ms. Oloughlin reports that she quit smoking about 9 years ago. Her smoking use included cigarettes. She started smoking about 37 years ago. She has a 6.9 pack-year smoking history. She has never been exposed to tobacco smoke. She has never used smokeless tobacco. Ms. Delker reports no history  of alcohol use.    Physical Examination Today's Vitals   10/09/24 1522  BP: (!) 130/50  Pulse: 83  SpO2: 97%  Weight: 132 lb 6.4 oz (60.1 kg)  Height: 5' (1.524 m)   Body mass index is 25.86 kg/m.  Gen: resting comfortably, no acute distress HEENT: no scleral icterus, pupils equal round and reactive, no palptable cervical adenopathy,  CV: RRR, no m/rg, no jvd Resp: Clear to auscultation bilaterally GI: abdomen is soft, non-tender, non-distended, normal bowel sounds, no hepatosplenomegaly MSK: extremities are  warm, no edema.  Skin: warm, no rash Neuro:  no focal deficits Psych: appropriate affect   Diagnostic Studies 04/2015 Cath Mid RCA lesion, 99% stenosed. There is a 0% residual stenosis post intervention. Prox RCA to Mid RCA lesion, 75% stenosed. There is a 0% residual stenosis post intervention. The left ventricular systolic function is normal.       04/2015 Echo - Left ventricle: The cavity size was normal. Systolic function was   normal. The estimated ejection fraction was in the range of 55%   to 60%. Wall motion was normal; there were no regional wall   motion abnormalities. Doppler parameters are consistent with   abnormal left ventricular relaxation (grade 1 diastolic   dysfunction).   09/2015 PFTs No COPD   05/2019 ABIs Summary:  Right: Resting right ankle-brachial index is within normal range. No  evidence of significant right lower extremity arterial disease. The right  toe-brachial index is normal.   Left: Resting left ankle-brachial index is within normal range. No  evidence of significant left lower extremity arterial disease. The left  toe-brachial index is normal.    06/2020 echo IMPRESSIONS     1. Left ventricular ejection fraction, by estimation, is 60 to 65%. The  left ventricle has normal function. The left ventricle has no regional  wall motion abnormalities. Left ventricular diastolic parameters are  indeterminate.   2. Right  ventricular systolic function is normal. The right ventricular  size is normal. There is normal pulmonary artery systolic pressure. The  estimated right ventricular systolic pressure is 23.4 mmHg.   3. The mitral valve is grossly normal. Trivial mitral valve  regurgitation.   4. The aortic valve is tricuspid. Aortic valve regurgitation is not  visualized. Mild aortic valve sclerosis is present, with no evidence of  aortic valve stenosis.   5. The inferior vena cava is normal in size with greater than 50%  respiratory variability, suggesting right atrial pressure of 3 mmHg.     Assessment and Plan   1.CAD -no recent symptoms, continue current meds     2. HTN - bp at goal, continue current meds   3.Hyperlipidemia - repeat lipid panel, continue current meds  F/u 6 months    Dorn PHEBE Ross, M.D.     [1] No Known Allergies [2] No Known Allergies  "

## 2024-10-25 ENCOUNTER — Ambulatory Visit: Admitting: Nurse Practitioner

## 2024-10-25 ENCOUNTER — Encounter: Payer: Self-pay | Admitting: Nurse Practitioner

## 2024-10-25 VITALS — BP 108/70 | HR 82 | Ht 60.0 in | Wt 134.4 lb

## 2024-10-25 DIAGNOSIS — Z7984 Long term (current) use of oral hypoglycemic drugs: Secondary | ICD-10-CM | POA: Diagnosis not present

## 2024-10-25 DIAGNOSIS — I10 Essential (primary) hypertension: Secondary | ICD-10-CM | POA: Diagnosis not present

## 2024-10-25 DIAGNOSIS — E1165 Type 2 diabetes mellitus with hyperglycemia: Secondary | ICD-10-CM

## 2024-10-25 DIAGNOSIS — E782 Mixed hyperlipidemia: Secondary | ICD-10-CM | POA: Diagnosis not present

## 2024-10-25 LAB — POCT GLYCOSYLATED HEMOGLOBIN (HGB A1C): Hemoglobin A1C: 0 % — AB (ref 4.0–5.6)

## 2024-10-25 NOTE — Progress Notes (Signed)
 "                                                                        Endocrinology Follow Up Note       10/25/2024, 4:10 PM   Subjective:    Patient ID: Andrea Herrera, female    DOB: December 21, 1950.  Andrea Herrera is being seen in follow up after being seen in consultation for management of currently uncontrolled symptomatic diabetes requested by  Job Bolt, PA (Inactive).   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 Received 2 DES to RCA. EF was normal.   Diabetes mellitus without complication (HCC)    GERD (gastroesophageal reflux disease)    HLD (hyperlipidemia)    Hypertension    Myocardial infarction [facility])     Past Surgical History:  Procedure Laterality Date   ABDOMINAL HYSTERECTOMY     BIOPSY  12/09/2023   Procedure: BIOPSY;  Surgeon: Shaaron Lamar HERO, MD;  [facility]: [facility];  Service: Endoscopy;;   BREAST BIOPSY Right 11/2021   SCLEROTIC FIBROADENOMATOID NODULE WITH DYSTROPHIC CALCIFICATIONS- NO MALIGNANCY   BREAST BIOPSY Right 08/2017   BENIGN BREAST TISSUE WITH HYALINIZATION AND FOCAL CALCIFICATION   BREAST BIOPSY Right 07/2012   hyalinized fibroadenoma with calcifications   BREAST BIOPSY Left    benign-pt unsure when   CARDIAC CATHETERIZATION N/A 04/26/2015   Procedure: Left Heart Cath and Coronary Angiography;  Surgeon: Dorn JINNY Lesches, MD;  Location: [facility] INVASIVE CV LAB;  Service: Cardiovascular;  Laterality: N/A;   cardiac stents  2016   x 2   COLONOSCOPY WITH PROPOFOL  N/A 08/17/2023   Procedure: COLONOSCOPY WITH PROPOFOL ;  Surgeon: Shaaron Lamar HERO, MD;  [facility]: [facility];  Service: Endoscopy;  Laterality: N/A;  11:00am, asa 3   COLONOSCOPY WITH PROPOFOL  N/A 12/09/2023   Procedure: COLONOSCOPY WITH PROPOFOL ;  Surgeon: Shaaron Lamar HERO, MD;  [facility];  Service: Endoscopy;  Laterality: N/A;  10:45 AM, ASA 3   ESOPHAGOGASTRODUODENOSCOPY  (EGD) WITH PROPOFOL  N/A 08/17/2023   Procedure: ESOPHAGOGASTRODUODENOSCOPY (EGD) WITH PROPOFOL ;  Surgeon: Shaaron Lamar HERO, MD;  [facility];  Service: Endoscopy;  Laterality: N/A;   MALONEY DILATION N/A 08/17/2023   Procedure: AGAPITO DILATION;  Surgeon: Shaaron Lamar HERO, MD;  [facility];  Service: Endoscopy;  Laterality: N/A;   POLYPECTOMY  08/17/2023   Procedure: POLYPECTOMY;  Surgeon: Shaaron Lamar HERO, MD;  [facility];  Service: Endoscopy;;   POLYPECTOMY  12/09/2023   Procedure: POLYPECTOMY INTESTINAL;  Surgeon: Shaaron Lamar HERO, MD;  [facility];  Service: Endoscopy;;   SCLEROTHERAPY  08/17/2023   Procedure: MATIAS;  Surgeon: Shaaron Lamar HERO, MD;  [facility];  Service: Endoscopy;;   SUBMUCOSAL LIFTING INJECTION  12/09/2023   Procedure: SUBMUCOSAL LIFTING INJECTION;  Surgeon: Shaaron Lamar HERO, MD;  [facility];  Service: Endoscopy;;   SUBMUCOSAL TATTOO INJECTION  08/17/2023   Procedure: SUBMUCOSAL TATTOO INJECTION;  Surgeon: Shaaron Lamar HERO, MD;  [facility];  Service: Endoscopy;;    Social History  Socioeconomic History   Marital status: Divorced    Spouse name: Not on file   Number of children: Not on file   Years of education: Not on file   Highest education level: Not on file  Occupational History   Not on file  Tobacco Use   Smoking status: Former    Current packs/day: 0.00    Average packs/day: 0.3 packs/day for 27.6 years (6.9 ttl pk-yrs)    Types: Cigarettes    Start date: 09/11/1987    Quit date: 04/21/2015    Years since quitting: 9.5    Passive exposure: Never   Smokeless tobacco: Never  Vaping Use   Vaping status: Never Used  Substance and Sexual Activity   Alcohol use: No    Alcohol/week: 0.0 standard drinks of alcohol   Drug use: No   Sexual activity: Not on file  Other Topics Concern   Not on file  Social History Narrative   Not on file   Social Drivers of Health    Tobacco Use: Medium Risk (10/25/2024)   Patient History    Smoking Tobacco Use: Former    Smokeless Tobacco Use: Never    Passive Exposure: Never  Programmer, 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 on Ship [employer] Insecurity: Not on file  Transportation Needs: Not on file  Physical Activity: Not on file  Stress: Not on file  Social Connections: Not on file  Depression (PHQ2-9): Not on file  Alcohol Screen: Not on file  Housing: Not on file  Utilities: Not on file  Health Literacy: Not on file    Family History  Problem Relation Age of Onset   Colon cancer Mother    Lung cancer Father    Stroke Father    Pancreatic cancer Brother    Heart disease Paternal Grandmother    Stroke Paternal Grandmother    Heart disease Paternal Grandfather    Stroke Paternal Grandfather     Outpatient Encounter Medications as of 10/25/2024  Medication Sig   acetaminophen  (TYLENOL ) 650 MG CR tablet Take 650 mg by mouth 2 (two) times daily as needed for pain.   amLODipine  (NORVASC ) 10 MG tablet Take 1 tablet by mouth daily   atorvastatin  (LIPITOR ) 80 MG tablet TAKE 1 TABLET BY MOUTH ONCE DAILY AT  6  PM   cholecalciferol (VITAMIN D) 1000 UNITS tablet Take 1,000 Units by mouth in the morning.   Ferrous Sulfate (IRON PO) Take 1 tablet by mouth daily.   gabapentin  (NEURONTIN ) 100 MG capsule Take 2 capsules (200 mg total) by mouth 3 (three) times daily. Patient states that she takes as needed   glipiZIDE  (GLUCOTROL  XL) 5 MG 24 hr tablet Take 1 tablet (5 mg total) by mouth daily.   insulin  glargine (LANTUS ) 100 UNIT/ML Solostar Pen Inject 15 Units into the skin at bedtime.   Insulin  Pen Needle (PEN NEEDLES) 32G X 4 MM MISC Use to inject insulin  once daily at bedtime   lisinopril  (ZESTRIL ) 5 MG tablet Take 1 tablet by mouth once daily   Magnesium 250 MG TABS Take 250 mg by mouth daily.   metFORMIN  (GLUCOPHAGE -XR) 500 MG 24 hr tablet Take 2 tablets (1,000 mg total) by mouth daily with breakfast.   metoprolol  tartrate  (LOPRESSOR ) 25 MG tablet Take 1 tablet (25 mg total) by mouth 2 (two) times daily.   Multiple Vitamin (MULTIVITAMIN) tablet Take 1 tablet by mouth in the morning.   nitroGLYCERIN  (NITROSTAT ) 0.4 MG SL tablet Place 1 tablet (0.4 mg total) under  the tongue every 5 (five) minutes x 3 doses as needed for chest pain.   pantoprazole  (PROTONIX ) 40 MG tablet Take 1 tablet by mouth once daily   POTASSIUM PO Take 99 mg by mouth every other day. In the morning.   No facility-administered encounter medications on file as of 10/25/2024.    ALLERGIES: No Known Allergies  VACCINATION STATUS:  There is no immunization history on file for this patient.  Diabetes She presents for her follow-up diabetic visit. She has type 2 diabetes mellitus. Onset time: started with GD at age 84 and never got rid of it. Her disease course has been improving. There are no hypoglycemic associated symptoms. Associated symptoms include fatigue, polydipsia, polyuria and weight loss. There are no hypoglycemic complications. Symptoms are improving. 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 Risk factors for coronary artery disease include diabetes mellitus, family history, hypertension, post-menopausal, sedentary lifestyle and tobacco exposure. Current diabetic treatment includes oral agent (dual therapy) and insulin  injections. She is compliant with treatment most of the time. Her weight is fluctuating minimally. She is following a generally unhealthy diet. When asked about meal planning, she reported none. She has not had a previous visit with a dietitian. She rarely participates in exercise. Her home blood glucose trend is decreasing steadily. Her breakfast blood glucose range is generally 140-180 mg/dl. Her bedtime blood glucose range is generally >200 mg/dl. Her overall blood glucose range is >200 mg/dl. (She presents today with her meter showing inconsistent glucose monitoring but improving readings  since starting insulin  last visit.  She was not due for another A1c today.  She has been tolerating the insulin  well, just expresses her dislike of it.  She has cut out most sweets.  Her fasting glucose is WAY better but her evening sugar readings are still out of control.  She does note she eats a snack probably a little too close to her checking her glucose.  She denies any hypoglycemia.) An ACE inhibitor/angiotensin II receptor blocker is being taken. She does not see a podiatrist.Eye exam is current.     Review of systems  Constitutional: + increasing body weight, current Body mass index is 26.25 kg/m., no fatigue, no subjective hyperthermia, no subjective hypothermia Eyes: no blurry vision, no xerophthalmia ENT: no sore throat, no nodules palpated in throat, no dysphagia/odynophagia, no hoarseness Cardiovascular: no chest pain, no shortness of breath, no palpitations, no leg swelling Respiratory: no cough, no shortness of breath Gastrointestinal: no nausea/vomiting/diarrhea Musculoskeletal: no muscle/joint aches Skin: no rashes, no hyperemia Neurological: no tremors, no numbness, no tingling, no dizziness Psychiatric: no depression, no anxiety  Objective:     BP 108/70 (BP Location: Left Arm, Patient Position: Sitting, Cuff Size: Large)   Pulse 82   Ht 5' (1.524 m)   Wt 134 lb 6.4 oz (61 kg)   BMI 26.25 kg/m   Wt Readings from Last 3 Encounters:  10/25/24 134 lb 6.4 oz (61 kg)  10/09/24 132 lb 6.4 oz (60.1 kg)  09/27/24 130 lb 3.2 oz (59.1 kg)     BP Readings from Last 3 Encounters:  10/25/24 108/70  10/09/24 (!) 130/50  09/27/24 128/60      Physical Exam- Limited  Constitutional:  Body mass index is 26.25 kg/m. , not in acute distress, normal state of mind Eyes:  EOMI, no exophthalmos Musculoskeletal: no gross deformities, strength intact in all four extremities, no gross restriction of joint movements Skin:  no rashes, no hyperemia Neurological: no tremor  with  outstretched hands   Diabetic Foot Exam - Simple   No data filed      CMP ( most recent) CMP     Component Value Date/Time   NA 138 12/09/2023 0952   K 3.9 12/09/2023 0952   CL 101 12/09/2023 0952   CO2 25 12/09/2023 0952   GLUCOSE 333 (H) 12/09/2023 0952   BUN 11 05/26/2024 0000   CREATININE 0.9 05/26/2024 0000   CREATININE 0.69 12/09/2023 0952   CALCIUM  9.9 12/09/2023 0952   PROT 7.7 04/26/2015 0357   ALBUMIN 4.2 04/26/2015 0357   AST 41 04/26/2015 0357   ALT 20 04/26/2015 0357   ALKPHOS 111 04/26/2015 0357   BILITOT 0.4 04/26/2015 0357   EGFR 93 05/26/2024 0000   GFRNONAA >60 12/09/2023 0952     Diabetic Labs (most recent): Lab Results  Component Value Date   HGBA1C 0.0 (A) 10/25/2024   HGBA1C 11.9 (A) 09/27/2024   HGBA1C 10.3 05/26/2024   MICROALBUR 30mg /L 09/27/2024     Lipid Panel ( most recent) Lipid Panel     Component Value Date/Time   CHOL 99 03/23/2021 0950   TRIG 60 03/23/2021 0950   HDL 56 03/23/2021 0950   CHOLHDL 1.8 03/23/2021 0950   VLDL 12 03/23/2021 0950   LDLCALC 31 03/23/2021 0950      Lab Results  Component Value Date   TSH 0.618 04/26/2015           Assessment & Plan:   1) Type 2 diabetes mellitus with hyperglycemia, without long-term current use of insulin  (HCC) (Primary)  She presents today with her meter showing inconsistent glucose monitoring but improving readings since starting insulin  last visit.  She was not due for another A1c today.  She has been tolerating the insulin  well, just expresses her dislike of it.  She has cut out most sweets.  Her fasting glucose is WAY better but her evening sugar readings are still out of control.  She does note she eats a snack probably a little too close to her checking her glucose.  She denies any hypoglycemia.  - Andrea Herrera has currently uncontrolled symptomatic type 2 DM since 74 years of age..   -Recent labs reviewed.    - I had a long discussion with her about the  progressive nature of diabetes and the pathology behind its complications. -her diabetes is complicated by CAD with MI, tobacco use and she remains at a high risk for more acute and chronic complications which include CAD, CVA, CKD, retinopathy, and neuropathy. These are all discussed in detail with her.  The following Lifestyle Medicine recommendations according to American College of Lifestyle Medicine [facility]) were discussed and offered to patient and she agrees to start the journey:  A. Whole Foods, Plant-based plate comprising of fruits and vegetables, plant-based proteins, whole-grain carbohydrates was discussed in detail with the patient.   A list for source of those nutrients were also provided to the patient.  Patient will use only water  or unsweetened tea for hydration. B.  The need to stay away from risky substances including alcohol, smoking; obtaining 7 to 9 hours of restorative sleep, at least 150 minutes of moderate intensity exercise weekly, the importance of healthy social connections,  and stress reduction techniques were discussed. C.  A full color page of  Calorie density of various food groups per pound showing examples of each food groups was provided to the patient.  - Nutritional counseling repeated/built upon at each appointment.  -  The patient admits there is a room for improvement in their diet and drink choices. -  Suggestion is made for the patient to avoid simple carbohydrates from their diet including Cakes, Sweet Desserts / Pastries, Ice Cream, Soda (diet and regular), Sweet Tea, Candies, Chips, Cookies, Sweet Pastries, Store Bought Juices, Alcohol in Excess of 1-2 drinks a day, Artificial Sweeteners, Coffee Creamer, and Sugar-free Products. This will help patient to have stable blood glucose profile and potentially avoid unintended weight gain.   - I encouraged the patient to switch to unprocessed or minimally processed complex starch and increased protein intake (animal  or plant source), fruits, and vegetables.   - Patient is advised to stick to a routine mealtimes to eat 3 meals a day and avoid unnecessary snacks (to snack only to correct hypoglycemia).  - I have approached her with the following individualized plan to manage her diabetes and patient agrees:   -She is advised to continue Lantus  15 units SQ nightly, Glipizide  5 mg XL daily at breakfast, and Metformin  1000 mg ER once daily as well.  Her glucose is responding well, and we did go over dietary strategies to help bring her evening numbers down too.  -she is encouraged to start/continue monitoring glucose 2 times daily, before breakfast and before bed, and to call the clinic if she has readings less than 70 or above 300 for 3 tests in a row.  I did offer CGM for her but she politely declined at this time.  - she is warned not to take insulin  without proper monitoring per orders. - Adjustment parameters are given to her for hypo and hyperglycemia in writing.  - she is not an ideal candidate for incretin therapy given heavy smoking history which increases risk of pancreatitis.  - Specific targets for  A1c; LDL, HDL, and Triglycerides were discussed with the patient.  2) Blood Pressure /Hypertension:  her blood pressure is controlled to target.   she is advised to continue her current medications as prescribed by her PCP.  3) Lipids/Hyperlipidemia:    There is no recent lipid panel to review.  she is advised to continue Lipitor  80 mg daily at bedtime.  Side effects and precautions discussed with her.  4)  Weight/Diet:  Exercise, and detailed carbohydrates information provided  -  detailed on discharge instructions.  5) Chronic Care/Health Maintenance: -she is on ACEI/ARB and Statin medications and is encouraged to initiate and continue to follow up with Ophthalmology, Dentist, Podiatrist at least yearly or according to recommendations, and advised to stay away from smoking (quite back in 2015 after  MI). I have recommended yearly flu vaccine and pneumonia vaccine at least every 5 years; moderate intensity exercise for up to 150 minutes weekly; and sleep for at least 7 hours a day.  - she is advised to maintain close follow up with Job Bolt, PA (Inactive) for primary care needs, as well as her other providers for optimal and coordinated care.     I spent  34  minutes in the care of the patient today including review of labs from CMP, Lipids, Thyroid  Function, Hematology (current and previous including abstractions from other facilities); face-to-face time discussing  her blood glucose readings/logs, discussing hypoglycemia and hyperglycemia episodes and symptoms, medications doses, her options of short and long term treatment based on the latest standards of care / guidelines;  discussion about incorporating lifestyle medicine;  and documenting the encounter. Risk reduction counseling performed per USPSTF guidelines to reduce obesity and  cardiovascular risk factors.     Please refer to Patient Instructions for Blood Glucose Monitoring and Insulin /Medications Dosing Guide  in media tab for additional information. Please  also refer to  Patient Self Inventory in the Media  tab for reviewed elements of pertinent patient history.  Andrea Herrera participated in the discussions, expressed understanding, and voiced agreement with the above plans.  All questions were answered to her satisfaction. she is encouraged to contact clinic should she have any questions or concerns prior to her return visit.    Follow up plan: - Return in about 3 months (around 01/23/2025) for Diabetes F/U with A1c in office, No previsit labs, Bring meter and logs.   Andrea Herrera, [facility] [address] [place], [place] [address] Phone: [phone] Fax: [phone]  10/25/2024, 4:10 PM    "

## 2024-11-14 ENCOUNTER — Other Ambulatory Visit (HOSPITAL_COMMUNITY): Payer: Self-pay | Admitting: Internal Medicine

## 2024-11-14 DIAGNOSIS — Z1231 Encounter for screening mammogram for malignant neoplasm of breast: Secondary | ICD-10-CM

## 2024-11-15 ENCOUNTER — Telehealth: Payer: Self-pay | Admitting: *Deleted

## 2024-11-15 NOTE — Telephone Encounter (Signed)
 A message was received by the after hours access nurse line. Patient's PCP office is requesting a copy of the patient's last office notes from her recent visit with us . This has been completed.

## 2024-11-22 ENCOUNTER — Inpatient Hospital Stay (HOSPITAL_COMMUNITY): Admission: RE | Admit: 2024-11-22

## 2024-12-13 ENCOUNTER — Ambulatory Visit (HOSPITAL_COMMUNITY)

## 2025-02-05 ENCOUNTER — Ambulatory Visit: Admitting: Nurse Practitioner
